# Patient Record
Sex: Female | Born: 1955 | Race: White | Hispanic: No | Marital: Married | Smoking: Never smoker
Health system: Southern US, Community
[De-identification: ages and names within clinical notes are randomized; demographics above are authoritative.]

## PROBLEM LIST (undated history)

## (undated) DIAGNOSIS — I509 Heart failure, unspecified: Secondary | ICD-10-CM

## (undated) DIAGNOSIS — G35 Multiple sclerosis: Secondary | ICD-10-CM

## (undated) DIAGNOSIS — N289 Disorder of kidney and ureter, unspecified: Secondary | ICD-10-CM

## (undated) DIAGNOSIS — C801 Malignant (primary) neoplasm, unspecified: Secondary | ICD-10-CM

## (undated) DIAGNOSIS — I10 Essential (primary) hypertension: Secondary | ICD-10-CM

## (undated) HISTORY — PX: NEPHRECTOMY TRANSPLANTED ORGAN: SUR880

---

## 2019-08-11 ENCOUNTER — Inpatient Hospital Stay (HOSPITAL_COMMUNITY): Payer: BC Managed Care – PPO

## 2019-08-11 ENCOUNTER — Other Ambulatory Visit (HOSPITAL_COMMUNITY): Payer: BC Managed Care – PPO

## 2019-08-11 ENCOUNTER — Inpatient Hospital Stay (HOSPITAL_BASED_OUTPATIENT_CLINIC_OR_DEPARTMENT_OTHER)
Admission: EM | Admit: 2019-08-11 | Discharge: 2019-08-20 | DRG: 871 | Disposition: A | Discharge status: Home / Self Care | Payer: BC Managed Care – PPO | Attending: Family Medicine | Admitting: Family Medicine

## 2019-08-11 ENCOUNTER — Emergency Department (HOSPITAL_BASED_OUTPATIENT_CLINIC_OR_DEPARTMENT_OTHER): Payer: BC Managed Care – PPO

## 2019-08-11 ENCOUNTER — Other Ambulatory Visit: Payer: Self-pay

## 2019-08-11 ENCOUNTER — Encounter (HOSPITAL_BASED_OUTPATIENT_CLINIC_OR_DEPARTMENT_OTHER): Payer: Self-pay

## 2019-08-11 DIAGNOSIS — R402362 Coma scale, best motor response, obeys commands, at arrival to emergency department: Secondary | ICD-10-CM | POA: Diagnosis present

## 2019-08-11 DIAGNOSIS — Z85038 Personal history of other malignant neoplasm of large intestine: Secondary | ICD-10-CM

## 2019-08-11 DIAGNOSIS — D631 Anemia in chronic kidney disease: Secondary | ICD-10-CM | POA: Diagnosis present

## 2019-08-11 DIAGNOSIS — Z79899 Other long term (current) drug therapy: Secondary | ICD-10-CM

## 2019-08-11 DIAGNOSIS — R29810 Facial weakness: Secondary | ICD-10-CM | POA: Diagnosis present

## 2019-08-11 DIAGNOSIS — Z905 Acquired absence of kidney: Secondary | ICD-10-CM

## 2019-08-11 DIAGNOSIS — I5181 Takotsubo syndrome: Secondary | ICD-10-CM | POA: Diagnosis present

## 2019-08-11 DIAGNOSIS — I472 Ventricular tachycardia: Secondary | ICD-10-CM | POA: Insufficient documentation

## 2019-08-11 DIAGNOSIS — I63519 Cerebral infarction due to unspecified occlusion or stenosis of unspecified middle cerebral artery: Secondary | ICD-10-CM | POA: Diagnosis present

## 2019-08-11 DIAGNOSIS — N179 Acute kidney failure, unspecified: Secondary | ICD-10-CM | POA: Diagnosis present

## 2019-08-11 DIAGNOSIS — I447 Left bundle-branch block, unspecified: Secondary | ICD-10-CM | POA: Diagnosis present

## 2019-08-11 DIAGNOSIS — Z87440 Personal history of urinary (tract) infections: Secondary | ICD-10-CM

## 2019-08-11 DIAGNOSIS — N289 Disorder of kidney and ureter, unspecified: Secondary | ICD-10-CM | POA: Diagnosis not present

## 2019-08-11 DIAGNOSIS — R4701 Aphasia: Secondary | ICD-10-CM | POA: Diagnosis present

## 2019-08-11 DIAGNOSIS — I10 Essential (primary) hypertension: Secondary | ICD-10-CM | POA: Diagnosis present

## 2019-08-11 DIAGNOSIS — D649 Anemia, unspecified: Secondary | ICD-10-CM

## 2019-08-11 DIAGNOSIS — R0789 Other chest pain: Secondary | ICD-10-CM | POA: Diagnosis not present

## 2019-08-11 DIAGNOSIS — Z933 Colostomy status: Secondary | ICD-10-CM

## 2019-08-11 DIAGNOSIS — A419 Sepsis, unspecified organism: Secondary | ICD-10-CM | POA: Diagnosis present

## 2019-08-11 DIAGNOSIS — R402222 Coma scale, best verbal response, incomprehensible words, at arrival to emergency department: Secondary | ICD-10-CM | POA: Diagnosis present

## 2019-08-11 DIAGNOSIS — N390 Urinary tract infection, site not specified: Secondary | ICD-10-CM | POA: Diagnosis present

## 2019-08-11 DIAGNOSIS — I462 Cardiac arrest due to underlying cardiac condition: Secondary | ICD-10-CM | POA: Diagnosis present

## 2019-08-11 DIAGNOSIS — Z20828 Contact with and (suspected) exposure to other viral communicable diseases: Secondary | ICD-10-CM | POA: Diagnosis present

## 2019-08-11 DIAGNOSIS — I639 Cerebral infarction, unspecified: Secondary | ICD-10-CM | POA: Diagnosis present

## 2019-08-11 DIAGNOSIS — Z79891 Long term (current) use of opiate analgesic: Secondary | ICD-10-CM

## 2019-08-11 DIAGNOSIS — E876 Hypokalemia: Secondary | ICD-10-CM | POA: Diagnosis not present

## 2019-08-11 DIAGNOSIS — G9340 Encephalopathy, unspecified: Secondary | ICD-10-CM | POA: Diagnosis present

## 2019-08-11 DIAGNOSIS — I4901 Ventricular fibrillation: Secondary | ICD-10-CM

## 2019-08-11 DIAGNOSIS — G9341 Metabolic encephalopathy: Secondary | ICD-10-CM | POA: Diagnosis present

## 2019-08-11 DIAGNOSIS — R29722 NIHSS score 22: Secondary | ICD-10-CM | POA: Diagnosis present

## 2019-08-11 DIAGNOSIS — D75839 Thrombocytosis, unspecified: Secondary | ICD-10-CM

## 2019-08-11 DIAGNOSIS — L89152 Pressure ulcer of sacral region, stage 2: Secondary | ICD-10-CM | POA: Diagnosis present

## 2019-08-11 DIAGNOSIS — G35 Multiple sclerosis: Secondary | ICD-10-CM | POA: Diagnosis present

## 2019-08-11 DIAGNOSIS — I420 Dilated cardiomyopathy: Secondary | ICD-10-CM | POA: Diagnosis present

## 2019-08-11 DIAGNOSIS — D473 Essential (hemorrhagic) thrombocythemia: Secondary | ICD-10-CM

## 2019-08-11 DIAGNOSIS — I6389 Other cerebral infarction: Secondary | ICD-10-CM

## 2019-08-11 DIAGNOSIS — I130 Hypertensive heart and chronic kidney disease with heart failure and stage 1 through stage 4 chronic kidney disease, or unspecified chronic kidney disease: Secondary | ICD-10-CM | POA: Diagnosis present

## 2019-08-11 DIAGNOSIS — I5021 Acute systolic (congestive) heart failure: Secondary | ICD-10-CM | POA: Diagnosis present

## 2019-08-11 DIAGNOSIS — I69351 Hemiplegia and hemiparesis following cerebral infarction affecting right dominant side: Secondary | ICD-10-CM

## 2019-08-11 DIAGNOSIS — F329 Major depressive disorder, single episode, unspecified: Secondary | ICD-10-CM | POA: Diagnosis present

## 2019-08-11 DIAGNOSIS — J9600 Acute respiratory failure, unspecified whether with hypoxia or hypercapnia: Secondary | ICD-10-CM

## 2019-08-11 DIAGNOSIS — R402142 Coma scale, eyes open, spontaneous, at arrival to emergency department: Secondary | ICD-10-CM | POA: Diagnosis present

## 2019-08-11 DIAGNOSIS — N184 Chronic kidney disease, stage 4 (severe): Secondary | ICD-10-CM | POA: Diagnosis present

## 2019-08-11 DIAGNOSIS — L8995 Pressure ulcer of unspecified site, unstageable: Secondary | ICD-10-CM | POA: Diagnosis not present

## 2019-08-11 DIAGNOSIS — Z888 Allergy status to other drugs, medicaments and biological substances status: Secondary | ICD-10-CM

## 2019-08-11 DIAGNOSIS — R778 Other specified abnormalities of plasma proteins: Secondary | ICD-10-CM | POA: Diagnosis not present

## 2019-08-11 DIAGNOSIS — L8990 Pressure ulcer of unspecified site, unspecified stage: Secondary | ICD-10-CM | POA: Insufficient documentation

## 2019-08-11 DIAGNOSIS — I4721 Torsades de pointes: Secondary | ICD-10-CM

## 2019-08-11 DIAGNOSIS — Z880 Allergy status to penicillin: Secondary | ICD-10-CM

## 2019-08-11 DIAGNOSIS — J9601 Acute respiratory failure with hypoxia: Secondary | ICD-10-CM | POA: Diagnosis not present

## 2019-08-11 HISTORY — DX: Multiple sclerosis: G35

## 2019-08-11 HISTORY — DX: Malignant (primary) neoplasm, unspecified: C80.1

## 2019-08-11 HISTORY — DX: Disorder of kidney and ureter, unspecified: N28.9

## 2019-08-11 HISTORY — DX: Essential (primary) hypertension: I10

## 2019-08-11 LAB — BASIC METABOLIC PANEL
Anion gap: 13 (ref 5–15)
BUN: 18 mg/dL (ref 8–23)
CO2: 23 mmol/L (ref 22–32)
Calcium: 8.5 mg/dL — ABNORMAL LOW (ref 8.9–10.3)
Chloride: 102 mmol/L (ref 98–111)
Creatinine, Ser: 1.9 mg/dL — ABNORMAL HIGH (ref 0.44–1.00)
GFR calc Af Amer: 32 mL/min — ABNORMAL LOW (ref >60)
GFR calc non Af Amer: 27 mL/min — ABNORMAL LOW (ref >60)
Glucose, Bld: 122 mg/dL — ABNORMAL HIGH (ref 70–99)
Potassium: 3.9 mmol/L (ref 3.5–5.1)
Sodium: 138 mmol/L (ref 135–145)

## 2019-08-11 LAB — RAPID URINE DRUG SCREEN, HOSP PERFORMED
Amphetamines: NOT DETECTED
Barbiturates: NOT DETECTED
Benzodiazepines: NOT DETECTED
Cocaine: NOT DETECTED
Opiates: NOT DETECTED
Tetrahydrocannabinol: NOT DETECTED

## 2019-08-11 LAB — DIFFERENTIAL
Abs Immature Granulocytes: 0.19 10*3/uL — ABNORMAL HIGH (ref 0.00–0.07)
Basophils Absolute: 0.1 10*3/uL (ref 0.0–0.1)
Basophils Relative: 1 %
Eosinophils Absolute: 0.4 10*3/uL (ref 0.0–0.5)
Eosinophils Relative: 4 %
Immature Granulocytes: 2 %
Lymphocytes Relative: 26 %
Lymphs Abs: 2.8 10*3/uL (ref 0.7–4.0)
Monocytes Absolute: 0.7 10*3/uL (ref 0.1–1.0)
Monocytes Relative: 6 %
Neutro Abs: 6.3 10*3/uL (ref 1.7–7.7)
Neutrophils Relative %: 61 %

## 2019-08-11 LAB — POCT I-STAT 7, (LYTES, BLD GAS, ICA,H+H)
Acid-Base Excess: 5 mmol/L — ABNORMAL HIGH (ref 0.0–2.0)
Acid-Base Excess: 3 mmol/L — ABNORMAL HIGH (ref 0.0–2.0)
Bicarbonate: 26.5 mmol/L (ref 20.0–28.0)
Bicarbonate: 25.7 mmol/L (ref 20.0–28.0)
Calcium, Ion: 1.06 mmol/L — ABNORMAL LOW (ref 1.15–1.40)
Calcium, Ion: 1.15 mmol/L (ref 1.15–1.40)
HCT: 28 % — ABNORMAL LOW (ref 36.0–46.0)
HCT: 29 % — ABNORMAL LOW (ref 36.0–46.0)
Hemoglobin: 9.5 g/dL — ABNORMAL LOW (ref 12.0–15.0)
Hemoglobin: 9.9 g/dL — ABNORMAL LOW (ref 12.0–15.0)
O2 Saturation: 100 %
O2 Saturation: 100 %
Patient temperature: 99.3
Potassium: 3.3 mmol/L — ABNORMAL LOW (ref 3.5–5.1)
Potassium: 3.4 mmol/L — ABNORMAL LOW (ref 3.5–5.1)
Sodium: 135 mmol/L (ref 135–145)
Sodium: 136 mmol/L (ref 135–145)
TCO2: 27 mmol/L (ref 22–32)
TCO2: 27 mmol/L (ref 22–32)
pCO2 arterial: 26 mmHg — ABNORMAL LOW (ref 32.0–48.0)
pCO2 arterial: 34 mmHg (ref 32.0–48.0)
pH, Arterial: 7.616 (ref 7.350–7.450)
pH, Arterial: 7.488 — ABNORMAL HIGH (ref 7.350–7.450)
pO2, Arterial: 190 mmHg — ABNORMAL HIGH (ref 83.0–108.0)
pO2, Arterial: 199 mmHg — ABNORMAL HIGH (ref 83.0–108.0)

## 2019-08-11 LAB — COMPREHENSIVE METABOLIC PANEL
ALT: 57 U/L — ABNORMAL HIGH (ref 0–44)
ALT: 59 U/L — ABNORMAL HIGH (ref 0–44)
AST: 45 U/L — ABNORMAL HIGH (ref 15–41)
AST: 48 U/L — ABNORMAL HIGH (ref 15–41)
Albumin: 3.4 g/dL — ABNORMAL LOW (ref 3.5–5.0)
Albumin: 3 g/dL — ABNORMAL LOW (ref 3.5–5.0)
Alkaline Phosphatase: 101 U/L (ref 38–126)
Alkaline Phosphatase: 93 U/L (ref 38–126)
Anion gap: 15 (ref 5–15)
Anion gap: 12 (ref 5–15)
BUN: 17 mg/dL (ref 8–23)
BUN: 15 mg/dL (ref 8–23)
CO2: 22 mmol/L (ref 22–32)
CO2: 22 mmol/L (ref 22–32)
Calcium: 9.1 mg/dL (ref 8.9–10.3)
Calcium: 8.7 mg/dL — ABNORMAL LOW (ref 8.9–10.3)
Chloride: 100 mmol/L (ref 98–111)
Chloride: 102 mmol/L (ref 98–111)
Creatinine, Ser: 1.49 mg/dL — ABNORMAL HIGH (ref 0.44–1.00)
Creatinine, Ser: 1.69 mg/dL — ABNORMAL HIGH (ref 0.44–1.00)
GFR calc Af Amer: 42 mL/min — ABNORMAL LOW (ref >60)
GFR calc Af Amer: 36 mL/min — ABNORMAL LOW (ref >60)
GFR calc non Af Amer: 36 mL/min — ABNORMAL LOW (ref >60)
GFR calc non Af Amer: 31 mL/min — ABNORMAL LOW (ref >60)
Glucose, Bld: 146 mg/dL — ABNORMAL HIGH (ref 70–99)
Glucose, Bld: 209 mg/dL — ABNORMAL HIGH (ref 70–99)
Potassium: 3.5 mmol/L (ref 3.5–5.1)
Potassium: 3.4 mmol/L — ABNORMAL LOW (ref 3.5–5.1)
Sodium: 137 mmol/L (ref 135–145)
Sodium: 136 mmol/L (ref 135–145)
Total Bilirubin: 0.8 mg/dL (ref 0.3–1.2)
Total Bilirubin: 0.4 mg/dL (ref 0.3–1.2)
Total Protein: 6.6 g/dL (ref 6.5–8.1)
Total Protein: 6 g/dL — ABNORMAL LOW (ref 6.5–8.1)

## 2019-08-11 LAB — URINALYSIS, MICROSCOPIC (REFLEX): Squamous Epithelial / HPF: NONE SEEN (ref 0–5)

## 2019-08-11 LAB — LIPID PANEL
Cholesterol: 160 mg/dL (ref 0–200)
HDL: 35 mg/dL — ABNORMAL LOW (ref >40)
LDL Cholesterol: 92 mg/dL (ref 0–99)
Total CHOL/HDL Ratio: 4.6 RATIO
Triglycerides: 165 mg/dL — ABNORMAL HIGH (ref <150)
VLDL: 33 mg/dL (ref 0–40)

## 2019-08-11 LAB — URINALYSIS, ROUTINE W REFLEX MICROSCOPIC
Bilirubin Urine: NEGATIVE
Glucose, UA: NEGATIVE mg/dL
Ketones, ur: 15 mg/dL — AB
Nitrite: NEGATIVE
Protein, ur: 100 mg/dL — AB
Specific Gravity, Urine: 1.015 (ref 1.005–1.030)
pH: 8 (ref 5.0–8.0)

## 2019-08-11 LAB — CBC
HCT: 30.6 % — ABNORMAL LOW (ref 36.0–46.0)
HCT: 32 % — ABNORMAL LOW (ref 36.0–46.0)
Hemoglobin: 9.6 g/dL — ABNORMAL LOW (ref 12.0–15.0)
Hemoglobin: 9.9 g/dL — ABNORMAL LOW (ref 12.0–15.0)
MCH: 29.6 pg (ref 26.0–34.0)
MCH: 29.9 pg (ref 26.0–34.0)
MCHC: 30.9 g/dL (ref 30.0–36.0)
MCHC: 31.4 g/dL (ref 30.0–36.0)
MCV: 95.3 fL (ref 80.0–100.0)
MCV: 95.5 fL (ref 80.0–100.0)
Platelets: 411 10*3/uL — ABNORMAL HIGH (ref 150–400)
Platelets: 458 10*3/uL — ABNORMAL HIGH (ref 150–400)
RBC: 3.21 MIL/uL — ABNORMAL LOW (ref 3.87–5.11)
RBC: 3.35 MIL/uL — ABNORMAL LOW (ref 3.87–5.11)
RDW: 16.5 % — ABNORMAL HIGH (ref 11.5–15.5)
RDW: 16.7 % — ABNORMAL HIGH (ref 11.5–15.5)
WBC: 10.5 10*3/uL (ref 4.0–10.5)
WBC: 27.3 10*3/uL — ABNORMAL HIGH (ref 4.0–10.5)
nRBC: 0.5 % — ABNORMAL HIGH (ref 0.0–0.2)
nRBC: 0.7 % — ABNORMAL HIGH (ref 0.0–0.2)

## 2019-08-11 LAB — ETHANOL: Alcohol, Ethyl (B): <10 mg/dL (ref <10)

## 2019-08-11 LAB — APTT: aPTT: 29 seconds (ref 24–36)

## 2019-08-11 LAB — SARS CORONAVIRUS 2 (TAT 6-24 HRS): SARS Coronavirus 2: NEGATIVE

## 2019-08-11 LAB — ECHOCARDIOGRAM COMPLETE
Height: 62 in
Weight: 2130.53 oz

## 2019-08-11 LAB — PROTIME-INR
INR: 1.1 (ref 0.8–1.2)
Prothrombin Time: 13.6 seconds (ref 11.4–15.2)

## 2019-08-11 LAB — CBG MONITORING, ED
Glucose-Capillary: 124 mg/dL — ABNORMAL HIGH (ref 70–99)
Glucose-Capillary: 124 mg/dL — ABNORMAL HIGH (ref 70–99)

## 2019-08-11 LAB — HEMOGLOBIN A1C
Hgb A1c MFr Bld: 4.5 % — ABNORMAL LOW (ref 4.8–5.6)
Hgb A1c MFr Bld: 4.6 % — ABNORMAL LOW (ref 4.8–5.6)
Mean Plasma Glucose: 82.45 mg/dL
Mean Plasma Glucose: 85.32 mg/dL

## 2019-08-11 LAB — GLUCOSE, CAPILLARY
Glucose-Capillary: 119 mg/dL — ABNORMAL HIGH (ref 70–99)
Glucose-Capillary: 103 mg/dL — ABNORMAL HIGH (ref 70–99)

## 2019-08-11 LAB — TROPONIN I (HIGH SENSITIVITY)
Troponin I (High Sensitivity): 38 ng/L — ABNORMAL HIGH (ref <18)
Troponin I (High Sensitivity): 344 ng/L (ref <18)
Troponin I (High Sensitivity): 229 ng/L (ref <18)

## 2019-08-11 LAB — MAGNESIUM
Magnesium: 2.6 mg/dL — ABNORMAL HIGH (ref 1.7–2.4)
Magnesium: 2.4 mg/dL (ref 1.7–2.4)

## 2019-08-11 LAB — SARS CORONAVIRUS 2 BY RT PCR (HOSPITAL ORDER, PERFORMED IN ~~LOC~~ HOSPITAL LAB): SARS Coronavirus 2: NEGATIVE

## 2019-08-11 LAB — MRSA PCR SCREENING: MRSA by PCR: NEGATIVE

## 2019-08-11 MED ORDER — ASPIRIN 300 MG RE SUPP
300.0000 mg | Freq: Every day | RECTAL | Status: DC
  Administered 2019-08-12: 10:00:00 300 mg via RECTAL
  Filled 2019-08-11 (×2): qty 1

## 2019-08-11 MED ORDER — PERFLUTREN LIPID MICROSPHERE
1.0000 mL | INTRAVENOUS | Status: AC | PRN
  Administered 2019-08-11: 15:00:00 2 mL via INTRAVENOUS
  Filled 2019-08-11: qty 10

## 2019-08-11 MED ORDER — INSULIN ASPART 100 UNIT/ML ~~LOC~~ SOLN: 0.0000 [IU] | SUBCUTANEOUS | Status: DC

## 2019-08-11 MED ORDER — ACETAMINOPHEN 160 MG/5ML PO SOLN: 650.0000 mg | ORAL | Status: DC | PRN

## 2019-08-11 MED ORDER — AMIODARONE HCL 150 MG/3ML IV SOLN
INTRAVENOUS | Status: AC | PRN
  Administered 2019-08-11: 150 mg via INTRAVENOUS

## 2019-08-11 MED ORDER — DIPHENHYDRAMINE HCL 50 MG/ML IJ SOLN
12.5000 mg | Freq: Once | INTRAMUSCULAR | Status: AC
  Administered 2019-08-11: 02:00:00 12.5 mg via INTRAVENOUS
  Filled 2019-08-11: qty 1

## 2019-08-11 MED ORDER — ORAL CARE MOUTH RINSE
15.0000 mL | OROMUCOSAL | Status: DC
  Administered 2019-08-11 – 2019-08-13 (×11): 15 mL via OROMUCOSAL

## 2019-08-11 MED ORDER — MAGNESIUM SULFATE 50 % IJ SOLN
INTRAMUSCULAR | Status: AC | PRN
  Administered 2019-08-11: 2 g via INTRAVENOUS

## 2019-08-11 MED ORDER — CHLORHEXIDINE GLUCONATE 0.12% ORAL RINSE (MEDLINE KIT)
15.0000 mL | Freq: Two times a day (BID) | OROMUCOSAL | Status: DC
  Administered 2019-08-11 – 2019-08-12 (×2): 15 mL via OROMUCOSAL

## 2019-08-11 MED ORDER — PANTOPRAZOLE SODIUM 40 MG IV SOLR
40.0000 mg | Freq: Every day | INTRAVENOUS | Status: DC
  Administered 2019-08-11: 10:00:00 40 mg via INTRAVENOUS
  Filled 2019-08-11 (×2): qty 40

## 2019-08-11 MED ORDER — SODIUM CHLORIDE 0.9 % IV SOLN
INTRAVENOUS | Status: AC
  Administered 2019-08-11 (×2): via INTRAVENOUS

## 2019-08-11 MED ORDER — CHLORHEXIDINE GLUCONATE CLOTH 2 % EX PADS
6.0000 | MEDICATED_PAD | Freq: Every day | CUTANEOUS | Status: DC
  Administered 2019-08-11 – 2019-08-14 (×2): 6 via TOPICAL

## 2019-08-11 MED ORDER — ENOXAPARIN SODIUM 30 MG/0.3ML ~~LOC~~ SOLN
30.0000 mg | Freq: Every day | SUBCUTANEOUS | Status: DC
  Administered 2019-08-11 – 2019-08-13 (×3): 30 mg via SUBCUTANEOUS
  Filled 2019-08-11 (×4): qty 0.3

## 2019-08-11 MED ORDER — ACETAMINOPHEN 325 MG PO TABS: 650.0000 mg | ORAL_TABLET | ORAL | Status: DC | PRN

## 2019-08-11 MED ORDER — FENTANYL CITRATE (PF) 100 MCG/2ML IJ SOLN: 25.0000 ug | INTRAMUSCULAR | Status: DC | PRN

## 2019-08-11 MED ORDER — PROCHLORPERAZINE EDISYLATE 10 MG/2ML IJ SOLN
5.0000 mg | Freq: Once | INTRAMUSCULAR | Status: AC
  Administered 2019-08-11: 5 mg via INTRAVENOUS
  Filled 2019-08-11: qty 2

## 2019-08-11 MED ORDER — IOHEXOL 350 MG/ML SOLN
100.0000 mL | Freq: Once | INTRAVENOUS | Status: AC | PRN
  Administered 2019-08-11: 100 mL via INTRAVENOUS

## 2019-08-11 MED ORDER — ACETAMINOPHEN 650 MG RE SUPP
650.0000 mg | RECTAL | Status: DC | PRN
  Filled 2019-08-11: qty 1

## 2019-08-11 MED ORDER — METRONIDAZOLE IN NACL 5-0.79 MG/ML-% IV SOLN
500.0000 mg | Freq: Four times a day (QID) | INTRAVENOUS | Status: DC
  Administered 2019-08-11 – 2019-08-13 (×7): 500 mg via INTRAVENOUS
  Filled 2019-08-11 (×7): qty 100

## 2019-08-11 MED ORDER — HYDRALAZINE HCL 20 MG/ML IJ SOLN: 10.0000 mg | INTRAMUSCULAR | Status: DC | PRN

## 2019-08-11 MED ORDER — SODIUM CHLORIDE 0.9 % IV SOLN
1.0000 g | Freq: Every day | INTRAVENOUS | Status: DC
  Administered 2019-08-11 – 2019-08-12 (×2): 1 g via INTRAVENOUS
  Filled 2019-08-11: qty 1
  Filled 2019-08-11 (×2): qty 10

## 2019-08-11 MED ORDER — ASPIRIN 325 MG PO TABS
325.0000 mg | ORAL_TABLET | Freq: Every day | ORAL | Status: DC
  Administered 2019-08-11 – 2019-08-16 (×5): 325 mg via ORAL
  Filled 2019-08-11 (×6): qty 1

## 2019-08-11 MED ORDER — LORAZEPAM 2 MG/ML IJ SOLN
0.2500 mg | Freq: Once | INTRAMUSCULAR | Status: AC
  Administered 2019-08-11: 0.25 mg via INTRAVENOUS
  Filled 2019-08-11: qty 1

## 2019-08-11 MED ORDER — FLUMAZENIL 0.5 MG/5ML IV SOLN
0.2000 mg | Freq: Once | INTRAVENOUS | Status: AC
  Administered 2019-08-11: 0.2 mg via INTRAVENOUS

## 2019-08-11 MED ORDER — POTASSIUM CHLORIDE 20 MEQ/15ML (10%) PO SOLN
20.0000 meq | Freq: Once | ORAL | Status: AC
  Administered 2019-08-12: 01:00:00 20 meq via ORAL
  Filled 2019-08-11: qty 15

## 2019-08-11 MED ORDER — ACETAMINOPHEN 160 MG/5ML PO SOLN
650.0000 mg | Freq: Once | ORAL | Status: AC
  Administered 2019-08-11: 10:00:00 650 mg
  Filled 2019-08-11: qty 20.3

## 2019-08-11 MED ORDER — PROPOFOL 1000 MG/100ML IV EMUL
INTRAVENOUS | Status: AC
  Filled 2019-08-11: qty 100

## 2019-08-11 MED ORDER — FENTANYL CITRATE (PF) 100 MCG/2ML IJ SOLN
25.0000 ug | INTRAMUSCULAR | Status: DC | PRN
  Administered 2019-08-12: 100 ug via INTRAVENOUS
  Filled 2019-08-11: qty 2

## 2019-08-11 MED ORDER — SUCCINYLCHOLINE CHLORIDE 20 MG/ML IJ SOLN
INTRAMUSCULAR | Status: AC | PRN
  Administered 2019-08-11: 100 mg via INTRAVENOUS

## 2019-08-11 MED ORDER — STROKE: EARLY STAGES OF RECOVERY BOOK
Freq: Once | Status: DC
  Filled 2019-08-11: qty 1

## 2019-08-11 MED ORDER — PROPOFOL 1000 MG/100ML IV EMUL
5.0000 ug/kg/min | INTRAVENOUS | Status: DC
  Administered 2019-08-11: 20:00:00 40 ug/kg/min via INTRAVENOUS
  Administered 2019-08-11: 07:00:00 5 ug/kg/min via INTRAVENOUS
  Filled 2019-08-11 (×3): qty 100

## 2019-08-11 MED FILL — Medication: Qty: 1 | Status: AC

## 2019-08-11 NOTE — Progress Notes (Signed)
Received pt in MRI on vent from ED RT.   S/p MRI, pt transported back to ED w/ no apparent complications noted.  ED RT aware.

## 2019-08-11 NOTE — ED Notes (Addendum)
Late entry- Pt was moved from hallway into treatment room due to incontinence.  Pt cleaned and linens changed.  Ativan 0.25mg  given at 0620 for MRI.  Within 1 min of administration pt had respiratory depression with snoring respirations.  Placed on NRB at 15 liters O2.  Placed back on cardiac monitor.  Breathing became agonal.  Dr. Roxanne Mins immediately called to bedside.  Pt being bagged with BVM. Pt in Vfib, no pulse- CPR initiated by Dr. Roxanne Mins.  Defib x 1 @ 120 J at [phone].  SR with strong pulses present.  Romazicon administered.  Pt placed back on NRB and having spontaneous respirations.  No gag reflex so pt intubated by Dr. Roxanne Mins.  Succs 100mg  and Propofol initiated post intubated due to pt biting on ET tube.  Pt started having runs of V tach.  Dr. Roxanne Mins called back to bedside.  Pt then in torsades.  Defib x 1 @ 150J.  SR with strong pulses.  Amiodarone 150mg  IV administered and Mag 2mg  IV initiated.    Pt's daughter remained at bedside during event.  She is a Therapist, sports and requested to stay.Support given to her and Chaplain to bedside for family support.  Pt's husband called and now at bedside.

## 2019-08-11 NOTE — ED Triage Notes (Signed)
Pt is a code stroke transfer form [facility] to obtain MRi from our facility. LKN 2230 last evening. Per EMS pt was not a TPA candidate because of a recent kidney surgery. Pt had R sided deficits, HA, and appeared altered initially. Upon notes head CTA appeared negative from [facility]. BP 138/64, HR 70, 100% RA o2. Gave 5mg  Compazine and 12.5mg  Benadryl @ [facility]

## 2019-08-11 NOTE — Progress Notes (Signed)
ABG results as follows:  Ph 7.62, PCO2  26 , PO2 190, HCO3-  27.  Results hand-delivered to Dr. Lamonte Sakai at bedside.  PRVC rate decreased to 12, fio2 decreased to .60.  Will continue to monitor.

## 2019-08-11 NOTE — ED Notes (Signed)
Dr. Hal Hope at bedside.  IV team remains at bedside.

## 2019-08-11 NOTE — ED Provider Notes (Addendum)
Called to see the patient emergently because of marked respiratory depression following a dose of intravenous lorazepam for MRI scan.  On arrival, patient with agonal respirations but maintaining adequate oxygen saturation while on nonrebreather mask.  However, she was found to be pulseless and monitor showed ventricular fibrillation.  CPR was initiated and patient was defibrillated once with successful conversion to sinus rhythm with a strong pulse.  She was given a dose of Romazicon, and seemed to be maintaining adequate respirations, but was noted to have absent gag reflex.  Therefore, the decision was made to intubate the patient to protect her airway.  Patient was intubated without sedation.  Following intubation, she was biting down on the endotracheal tube, so she is being sedated with diprovan.  Post intubation x-ray is pending.  Admission will be changed to critical care, who have been paged.  Post conversion ECG showed new right bundle branch block and new fairly deep T wave inversions in the anteroseptal leads.  It is not clear if these are related to her stroke or cardiac origin.  Patient started having runs of ventricular tachycardia and then went into ventricular fibrillation in a pattern that appeared to be torsade de pointes.  She was defibrillated x1 with return to sinus rhythm.  She is being given amiodarone bolus and drip as well as intravenous magnesium.  Case is discussed with Dr. Lamonte Sakai of critical care service who agrees to admit the patient.   EKG Interpretation  Date/Time:  Monday August 11 2019 06:42:02 EDT Ventricular Rate:  62 PR Interval:    QRS Duration: 147 QT Interval:  501 QTC Calculation: 509 R Axis:   -81 Text Interpretation:  Complete AV block with wide QRS complex RBBB and LAFB T wave inversion Anteroseptal leads Prolonged QT When compared with ECG of EARLIER SAME DATE Right bundle branch block is now present T wave inversion is now present Confirmed by Delora Fuel  ([id number]) on 08/11/2019 6:59:30 AM      Cardiopulmonary Resuscitation (CPR) Procedure Note Directed/Performed by: Delora Fuel I personally directed ancillary staff and/or performed CPR in an effort to regain return of spontaneous circulation and to maintain cardiac, neuro and systemic perfusion.   CRITICAL CARE Performed by: Delora Fuel Total critical care time: 35 minutes Critical care time was exclusive of separately billable procedures and treating other patients. Critical care was necessary to treat or prevent imminent or life-threatening deterioration. Critical care was time spent personally by me on the following activities: development of treatment plan with patient and/or surrogate as well as nursing, discussions with consultants, evaluation of patient's response to treatment, examination of patient, obtaining history from patient or surrogate, ordering and performing treatments and interventions, ordering and review of laboratory studies, ordering and review of radiographic studies, pulse oximetry and re-evaluation of patient's condition.  Procedure Name: Intubation Date/Time: 08/11/2019 7:06 AM Performed by: Delora Fuel, MD Oxygen Delivery Method: Non-rebreather mask Preoxygenation: Pre-oxygenation with 100% oxygen Laryngoscope Size: Glidescope and 3 Grade View: Grade I Tube size: 7.5 mm Number of attempts: 1 Airway Equipment and Method: Video-laryngoscopy and Rigid stylet Placement Confirmation: ETT inserted through vocal cords under direct vision,  Positive ETCO2 and Breath sounds checked- equal and bilateral Secured at: 25 cm Tube secured with: ETT holder Dental Injury: Teeth and Oropharynx as per pre-operative assessment          Delora Fuel, MD 17/00/17 4944    Delora Fuel, MD 96/75/91 0740

## 2019-08-11 NOTE — H&P (Signed)
History and Physical    Jeanne Bruce BSJ:[id number] DOB: 05-Jun-1956 DOA: 08/11/2019  PCP: Derrill Center., MD  Patient coming from: Home.  History obtained from patient's daughter.  Patient is confused.  Chief Complaint: Right facial droop slurred speech.  HPI: Jeanne Bruce is a 63 y.o. 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  Patient's daughter went to check on her and found that she had a right facial droop and slurred speech and she had profound weakness of the right upper and lower extremity more than usual when patient was not able lifted up.  Patient was brought into the ER at [facility] as a code stroke.  Per patient's daughter patient had some nausea vomiting.  After the initial complaint patient became more confused.  Did not have any chest pain or shortness of breath fever or chills.  Has been on multiple antibiotic recently for UTI.  ED Course: In the ER admits to the [facility] patient had CT head followed by CT angiogram of the head and neck which did not show any large vessel obstruction.  Patient's weakness of the right upper and lower extremity gradually improved but still about 4 x 5 strength in the right upper extremity and 3 x 5 in the right lower extremity.  Right facial droop has improved.  Patient is confused and does not follow commands.  Pupils equal reacting to light.  Tele neurology was consulted and family has declined TPA.  Patient admitted for further management of possible CVA.  On my exam patient's exam was as explained above.  UA showing possibility of UTI.  Patient does have a colostomy from previous surgery for colon cancer done in the 90s.  EKG shows sinus rhythm with atypical LBBB.  Patient is afebrile COVID-19 test is pending.  Rest of the  labs show creatinine of 1.4 which is around the same when compared with in care everywhere.  AST 45 ALT 57 platelets 411.  INR 1.1 patient was given Compazine and Benadryl for headache.  Review of Systems: As per HPI, rest all negative.   Past Medical History:  Diagnosis Date   Cancer ([place])    colon (resolved)   Hypertension    Multiple sclerosis ([place])    Renal disorder     Past Surgical History:  Procedure Laterality Date   NEPHRECTOMY TRANSPLANTED ORGAN       reports that she has never smoked. She has never used smokeless tobacco. She reports previous alcohol use. She reports that she does not use drugs.  Allergies  Allergen Reactions   Invanz [Ertapenem] Anaphylaxis   Mirabegron Other (See Comments)    Caused Hypertension   Claritin-D 12 Hour [Loratadine-Pseudoephedrine Er] Rash   Penicillins Rash    Pt tolerates cephalosporins  Did it involve swelling of the face/tongue/throat, SOB, or low BP? No Did it involve sudden or severe rash/hives, skin peeling, or any reaction on the inside of your mouth or nose? No Did you need to seek medical attention at a hospital or doctor's office? No When did it last happen?A long time ago per daughter  If all above answers are NO, may proceed with cephalosporin use.     Family History  Problem Relation Age of Onset   Diabetes Mellitus II Neg Hx  Prior to Admission medications   Medication Sig Start Date End Date Taking? Authorizing Provider  calcium-vitamin D (OSCAL WITH D) 500-200 MG-UNIT tablet Take 1 tablet by mouth.   Yes [provider]  conjugated estrogens (PREMARIN) vaginal cream Place 1 Applicatorful vaginally at bedtime.   Yes [provider]  escitalopram (LEXAPRO) 10 MG tablet Take 10 mg by mouth daily.   Yes [provider]  magnesium oxide (MAG-OX) 400 MG tablet Take 400 mg by mouth 2 (two) times daily.   Yes [provider]  natalizumab (TYSABRI) 300 MG/15ML  injection Inject into the vein every 6 (six) weeks.   Yes [provider]  omeprazole (PRILOSEC) 20 MG capsule Take 20 mg by mouth 2 (two) times daily before a meal.   Yes [provider]  ondansetron (ZOFRAN) 4 MG tablet Take 4 mg by mouth daily as needed for nausea/vomiting. 06/02/19  Yes [provider]  oxyCODONE (OXY IR/ROXICODONE) 5 MG immediate release tablet Take 5 mg by mouth every 4 (four) hours as needed for pain. 06/04/19  Yes [provider]  potassium chloride SA (KLOR-CON) 20 MEQ tablet Take 20 mEq by mouth 2 (two) times daily.   Yes [provider]  Probiotic Product (PROBIOTIC DAILY PO) Take 1 capsule by mouth daily.   Yes [provider]    Physical Exam: Constitutional: Moderately built and nourished. Vitals:   08/11/19 0215 08/11/19 0230 08/11/19 0245 08/11/19 0338  BP: 126/77 (!) 167/77 (!) 165/72 (!) 161/74  Pulse:   68 62  Resp: (!) 22 15 18 16   Temp:    98.5 F (36.9 C)  TempSrc:    Oral  SpO2: 99%  100% 100%  Weight:      Height:       Eyes: Anicteric no pallor. ENMT: No discharge from the ears eyes nose or mouth. Neck: No mass or.  No neck rigidity. Respiratory: No rhonchi or crepitations. Cardiovascular: S1-S2 heard. Abdomen: Colostomy bag seen and also recent surgical wound seen with nonhealing wound on the right lower quadrant. Musculoskeletal: No edema. Skin: Skin normal abdomen. Neurologic: Alert awake but not oriented and does not follow commands.  Right upper extremity is full raphae and right lower extremity 3 x 5.  Left upper extremity is 5 x 5 and left lower extremities 5 x 5.  No facial asymmetry tongue is midline pupils are equal and reactive to light. Psychiatric: Appears confused.   Labs on Admission: I have personally reviewed following labs and imaging studies  CBC: Recent Labs  Lab 08/11/19 0045  WBC 10.5  NEUTROABS 6.3  HGB 9.9*  HCT 32.0*  MCV 95.5  PLT 411*   Basic  Metabolic Panel: Recent Labs  Lab 08/11/19 0045  NA 137  K 3.5  CL 100  CO2 22  GLUCOSE 146*  BUN 17  CREATININE 1.49*  CALCIUM 9.1   GFR: Estimated Creatinine Clearance: 29.8 mL/min (A) (by C-G formula based on SCr of 1.49 mg/dL (H)). Liver Function Tests: Recent Labs  Lab 08/11/19 0045  AST 45*  ALT 57*  ALKPHOS 101  BILITOT 0.8  PROT 6.6  ALBUMIN 3.4*   No results for input(s): LIPASE, AMYLASE in the last 168 hours. No results for input(s): AMMONIA in the last 168 hours. Coagulation Profile: Recent Labs  Lab 08/11/19 0045  INR 1.1   Cardiac Enzymes: No results for input(s): CKTOTAL, CKMB, CKMBINDEX, TROPONINI in the last 168 hours. BNP (last 3 results) No results for input(s):  PROBNP in the last 8760 hours. HbA1C: No results for input(s): HGBA1C in the last 72 hours. CBG: Recent Labs  Lab 08/11/19 0028  GLUCAP 124*   Lipid Profile: No results for input(s): CHOL, HDL, LDLCALC, TRIG, CHOLHDL, LDLDIRECT in the last 72 hours. Thyroid Function Tests: No results for input(s): TSH, T4TOTAL, FREET4, T3FREE, THYROIDAB in the last 72 hours. Anemia Panel: No results for input(s): VITAMINB12, FOLATE, FERRITIN, TIBC, IRON, RETICCTPCT in the last 72 hours. Urine analysis:    Component Value Date/Time   COLORURINE STRAW (A) 08/11/2019 0113   APPEARANCEUR HAZY (A) 08/11/2019 0113   LABSPEC 1.015 08/11/2019 0113   PHURINE 8.0 08/11/2019 0113   GLUCOSEU NEGATIVE 08/11/2019 0113   HGBUR TRACE (A) 08/11/2019 0113   BILIRUBINUR NEGATIVE 08/11/2019 0113   KETONESUR 15 (A) 08/11/2019 0113   PROTEINUR 100 (A) 08/11/2019 0113   NITRITE NEGATIVE 08/11/2019 0113   LEUKOCYTESUR SMALL (A) 08/11/2019 0113   Sepsis Labs: @LABRCNTIP (procalcitonin:4,lacticidven:4) )No results found for this or any previous visit (from the past 240 hour(s)).   Radiological Exams on Admission: Ct Code Stroke Cta Head W/wo Contrast  Result Date: 08/11/2019 CLINICAL DATA:  Sudden onset  headache with unilateral weakness. EXAM: CT ANGIOGRAPHY HEAD AND NECK TECHNIQUE: Multidetector CT imaging of the head and neck was performed using the standard protocol during bolus administration of intravenous contrast. Multiplanar CT image reconstructions and MIPs were obtained to evaluate the vascular anatomy. Carotid stenosis measurements (when applicable) are obtained utilizing NASCET criteria, using the distal internal carotid diameter as the denominator. CONTRAST:  162mL OMNIPAQUE IOHEXOL 350 MG/ML SOLN COMPARISON:  Head CT 08/10/2019 FINDINGS: CT HEAD FINDINGS Brain: There is no mass, hemorrhage or extra-axial collection. The size and configuration of the ventricles and extra-axial CSF spaces are normal. There is no acute or chronic infarction. There is hypoattenuation of the periventricular white matter, most commonly indicating chronic ischemic microangiopathy. Skull: The visualized skull base, calvarium and extracranial soft tissues are normal. Sinuses/Orbits: No fluid levels or advanced mucosal thickening of the visualized paranasal sinuses. No mastoid or middle ear effusion. The orbits are normal. CTA NECK FINDINGS SKELETON: There is no bony spinal canal stenosis. No lytic or blastic lesion. OTHER NECK: Normal pharynx, larynx and major salivary glands. No cervical lymphadenopathy. Unremarkable thyroid gland. UPPER CHEST: No pneumothorax or pleural effusion. No nodules or masses. AORTIC ARCH: There is no calcific atherosclerosis of the aortic arch. There is no aneurysm, dissection or hemodynamically significant stenosis of the visualized portion of the aorta. Conventional 3 vessel aortic branching pattern. The visualized proximal subclavian arteries are widely patent. RIGHT CAROTID SYSTEM: Normal without aneurysm, dissection or stenosis. LEFT CAROTID SYSTEM: Normal without aneurysm, dissection or stenosis. VERTEBRAL ARTERIES: Codominant configuration. Both origins are clearly patent. There is no  dissection, occlusion or flow-limiting stenosis to the skull base (V1-V3 segments). CTA HEAD FINDINGS POSTERIOR CIRCULATION: --Vertebral arteries: Normal V4 segments. --Posterior inferior cerebellar arteries (PICA): Patent origins from the vertebral arteries. --Anterior inferior cerebellar arteries (AICA): Patent origins from the basilar artery. --Basilar artery: Normal. --Superior cerebellar arteries: Normal. --Posterior cerebral arteries: Normal. Both originate from the basilar artery. Posterior communicating arteries (p-comm) are diminutive or absent. ANTERIOR CIRCULATION: --Intracranial internal carotid arteries: Normal. --Anterior cerebral arteries (ACA): Normal. Both A1 segments are present. Patent anterior communicating artery (a-comm). --Middle cerebral arteries (MCA): Normal. VENOUS SINUSES: As permitted by contrast timing, patent. ANATOMIC VARIANTS: None Review of the MIP images confirms the above findings. IMPRESSION: 1. No intracranial arterial occlusion or high-grade stenosis. 2. No  dissection, aneurysm or hemodynamically significant stenosis of the major cervical or intracranial arteries. Electronically Signed   By: Ulyses Jarred M.D.   On: 08/11/2019 01:39   Ct Code Stroke Cta Neck W/wo Contrast  Result Date: 08/11/2019 CLINICAL DATA:  Sudden onset headache with unilateral weakness. EXAM: CT ANGIOGRAPHY HEAD AND NECK TECHNIQUE: Multidetector CT imaging of the head and neck was performed using the standard protocol during bolus administration of intravenous contrast. Multiplanar CT image reconstructions and MIPs were obtained to evaluate the vascular anatomy. Carotid stenosis measurements (when applicable) are obtained utilizing NASCET criteria, using the distal internal carotid diameter as the denominator. CONTRAST:  174mL OMNIPAQUE IOHEXOL 350 MG/ML SOLN COMPARISON:  Head CT 08/10/2019 FINDINGS: CT HEAD FINDINGS Brain: There is no mass, hemorrhage or extra-axial collection. The size and  configuration of the ventricles and extra-axial CSF spaces are normal. There is no acute or chronic infarction. There is hypoattenuation of the periventricular white matter, most commonly indicating chronic ischemic microangiopathy. Skull: The visualized skull base, calvarium and extracranial soft tissues are normal. Sinuses/Orbits: No fluid levels or advanced mucosal thickening of the visualized paranasal sinuses. No mastoid or middle ear effusion. The orbits are normal. CTA NECK FINDINGS SKELETON: There is no bony spinal canal stenosis. No lytic or blastic lesion. OTHER NECK: Normal pharynx, larynx and major salivary glands. No cervical lymphadenopathy. Unremarkable thyroid gland. UPPER CHEST: No pneumothorax or pleural effusion. No nodules or masses. AORTIC ARCH: There is no calcific atherosclerosis of the aortic arch. There is no aneurysm, dissection or hemodynamically significant stenosis of the visualized portion of the aorta. Conventional 3 vessel aortic branching pattern. The visualized proximal subclavian arteries are widely patent. RIGHT CAROTID SYSTEM: Normal without aneurysm, dissection or stenosis. LEFT CAROTID SYSTEM: Normal without aneurysm, dissection or stenosis. VERTEBRAL ARTERIES: Codominant configuration. Both origins are clearly patent. There is no dissection, occlusion or flow-limiting stenosis to the skull base (V1-V3 segments). CTA HEAD FINDINGS POSTERIOR CIRCULATION: --Vertebral arteries: Normal V4 segments. --Posterior inferior cerebellar arteries (PICA): Patent origins from the vertebral arteries. --Anterior inferior cerebellar arteries (AICA): Patent origins from the basilar artery. --Basilar artery: Normal. --Superior cerebellar arteries: Normal. --Posterior cerebral arteries: Normal. Both originate from the basilar artery. Posterior communicating arteries (p-comm) are diminutive or absent. ANTERIOR CIRCULATION: --Intracranial internal carotid arteries: Normal. --Anterior cerebral  arteries (ACA): Normal. Both A1 segments are present. Patent anterior communicating artery (a-comm). --Middle cerebral arteries (MCA): Normal. VENOUS SINUSES: As permitted by contrast timing, patent. ANATOMIC VARIANTS: None Review of the MIP images confirms the above findings. IMPRESSION: 1. No intracranial arterial occlusion or high-grade stenosis. 2. No dissection, aneurysm or hemodynamically significant stenosis of the major cervical or intracranial arteries. Electronically Signed   By: Ulyses Jarred M.D.   On: 08/11/2019 01:39   Ct Head Code Stroke Wo Contrast  Result Date: 08/11/2019 CLINICAL DATA:  Code stroke.  Headache with unilateral weakness. EXAM: CT HEAD WITHOUT CONTRAST TECHNIQUE: Contiguous axial images were obtained from the base of the skull through the vertex without intravenous contrast. COMPARISON:  None. FINDINGS: Brain: There is no mass, hemorrhage or extra-axial collection. The size and configuration of the ventricles and extra-axial CSF spaces are normal. There is hypoattenuation of the periventricular white matter, most commonly indicating chronic ischemic microangiopathy. Vascular: No abnormal hyperdensity of the major intracranial arteries or dural venous sinuses. No intracranial atherosclerosis. Skull: The visualized skull base, calvarium and extracranial soft tissues are normal. Sinuses/Orbits: No fluid levels or advanced mucosal thickening of the visualized paranasal sinuses. No mastoid or  middle ear effusion. The orbits are normal. ASPECTS [facility] Stroke Program Early CT Score) - Ganglionic level infarction (caudate, lentiform nuclei, internal capsule, insula, M1-M3 cortex): 7 - Supraganglionic infarction (M4-M6 cortex): 3 Total score (0-10 with 10 being normal): 10 IMPRESSION: 1. Chronic small vessel disease.  No intracranial hemorrhage. 2. ASPECTS is 10. 3. These results were called by telephone at the time of interpretation on 08/11/2019 at 12:45 am to Dr. Thayer Jew , who  verbally acknowledged these results. Electronically Signed   By: Ulyses Jarred M.D.   On: 08/11/2019 00:45    EKG: Independently reviewed.  Normal sinus rhythm with atypical LBBB.  Assessment/Plan Principal Problem:   Acute CVA (cerebrovascular accident) [facility]) Active Problems:   Acute lower UTI   Essential hypertension   Acute encephalopathy   CKD (chronic kidney disease) stage 4, GFR 15-29 ml/min (HCC)   Multiple sclerosis ([place])    1. Possible acute CVA -discussed with on-call neurologist Dr. Lorraine Lax.  At this time plan is to get MRI brain 2D echo.  Patient failed swallow evaluation will need speech therapy consult.  Neurochecks physical therapy consult aspirin check lipid panel hemoglobin A1c likely will need atorvastatin once patient passes swallow. 2. Acute encephalopathy possibly could be from stroke and possible UTI.  Check ammonia levels.  Closely monitor. 3. Possible UTI -get urine cultures.  On ceftriaxone discussed with patient's daughter and patient has tolerated ceftriaxone previously. 4. Recent right-sided nephrectomy secondary to recurrent abscess and now has chronic kidney disease stage III -follow metabolic panel closely. 5. History of hypertension presently taken off antihypertensives recently since blood pressure was in the low normal -allow for permissive hypertension due to possible stroke.  Stroke with rule out will need aggressive management. 6. History of multiple sclerosis on Tysabri. 7. Normocytic normochromic anemia -hemoglobin appears to be at baseline when compared to the recent past.  Follow CBC. 8. Sacral decubitus ulcer and nonhealing wound on the right lower quadrant of the abdomen from recent surgery for which wound team has been consulted.  Given that patient has possible stroke with confusion and UTI will need more than 2 midnight stay in inpatient status.   DVT prophylaxis: Lovenox. Code Status: Full code as confirmed with patient's daughter. Family  Communication: Patient's daughter. Disposition Plan: Home when stable. Consults called: Neurology. Admission status: Inpatient.   Rise Patience MD [facility] Pager [phone].  If 7PM-7AM, please contact night-coverage [email or web address] Password [email or web address]  08/11/2019, 5:26 AM

## 2019-08-11 NOTE — Progress Notes (Signed)
  Echocardiogram 2D Echocardiogram has been performed.  Jennette Dubin 08/11/2019, 3:44 PM

## 2019-08-11 NOTE — Progress Notes (Signed)
CRITICAL VALUE ALERT  Critical Value: troponin 344 Date & Time Notied:  08/11/2019 1452  Provider Notified: Dr Lamonte Sakai  Orders Received/Actions taken: text paged MD to make aware

## 2019-08-11 NOTE — ED Notes (Addendum)
Attempted 18g IV for CTP without success.  Daughter states pt is a difficult stick and they usually attempt 7-8 times.  Recently had PICC line removed.  CT notified unable to get 18g and IV consult placed.  Pt on portable cardiac monitor.

## 2019-08-11 NOTE — ED Notes (Signed)
Pt transported to MRI with RT and RN

## 2019-08-11 NOTE — Sedation Documentation (Addendum)
Pt intubated successfully. Color change noted. 25' @ the lip

## 2019-08-11 NOTE — Consult Note (Signed)
NAMELaurette Bruce, MRN:  [id number], DOB:  1956-08-10, LOS: 0 ADMISSION DATE:  08/11/2019, CONSULTATION DATE:  10/26 REFERRING MD:  danford, CHIEF COMPLAINT:  Cardiac arrest    Brief History   63 year old female with complicated medical history as listed below.  Admitted initially on 10/26 with a working diagnosis of rule out acute CVA, plus minus concern for urinary tract infection.  Was premedicated for MRI with lorazepam, subsequently developed respiratory depression and VF arrest.  Following 1 round of CPR and successful defibrillation was intubated for airway protection and pulmonary asked to evaluate  History of present illness   This is a 63 year old female patient with a complicated medical history.  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  She has a history of multiple sclerosis, and following her most recent hospitalization her baseline functional status was she was awake, oriented, ambulatory but had unsteady gait requiring stabilization with nearby chairs and wall.  Family notes she was in her usual state of health up until 10/21 at that point family noted she was "off" from the 22nd through the 26th family noted her to have decreased p.o. intake, worsening weakness.  The family was watching her a bit closer than usual because of this given concern of prior history of infections.  During the evening hours of 10/25 she was complaining of headache, daughter checked on her and found her to have worsening right-sided weakness and right facial droop.  She was initially brought to the med center at [facility] as a code stroke.  She did have some nausea and vomiting.  Initial CT of head was followed by CT angiogram these were both negative for large vessel obstructions.  They did note some improvement of right upper extremity strength as well as right facial droop.  She was evaluated by telemetry neurology who offered TPA but family declined given  recent surgery.  She was to be admitted with working diagnosis of rule out CVA, was premedicated with lorazepam for MRI, developed respiratory depression followed by VF arrest requiring 1 round of CPR, and defibrillation.  Was given Romazicon postarrest, was intubated for airway protection.  Pulmonary asked to evaluate.  Past Medical History   Multiple sclerosis, right sided weakness, prior right-sided nephrectomy for recurrent renal abscess, hypertension, history of colon cancer Significant Hospital Events   10/26 admitted initially with confusion, right-sided weakness with spastic movement of the right upper extremity; was being worked up for stroke.  While undergoing diagnostic evaluation had received Received lorazepam for MRI.  Developed respiratory depression, followed by agonal respiratory efforts, and then found to be in ventricular fibrillation.  CPR was initiated, was defibrillated x1 successfully, was given Romazicon, intubated for airway protection post arrest  Consults:  Neurology 10/26  Procedures:  Intubation 10/26 CPR 10/26   Significant Diagnostic Tests:  UDS negative  CT head/CTA 10/26: No intracranial arterial occlusion or high-grade stenosis.  No dissection, aneurysm or stenosis.  No mass, hemorrhage or acute findings EEG 10/26>>> MRI 10/26>>>  Micro Data:  Urine culture 10/26>>> Blood culture x2 10/26>>> COVID-19 10/26>>>  Antimicrobials:  Ceftriaxone 10/26  Interim history/subjective:  Sedated  Objective   Blood pressure 122/67, pulse 94, temperature 98.5 F (36.9 C), temperature source Oral, resp. rate 19, height 5\' 2"  (1.575 m), weight 59.8 kg, SpO2 100 %.    Vent Mode: PRVC FiO2 (%):  [15 %-100 %] 100 % Set Rate:  [14 bmp-18 bmp] 18 bmp Vt Set:  [400  mL-540 mL] 400 mL PEEP:  [5 [place] Pressure:  [8 cmH20-13 cmH20] 13 cmH20  No intake or output data in the 24 hours ending 08/11/19 0835 Filed Weights   08/11/19 0051  Weight: 59.8 kg     Examination: General: Chronically ill appearing white female currently sedated on propofol infusion and supported on mechanical ventilation HENT: Orally intubated mucous membranes moist sclera nonicteric pupils equal and reactive Lungs: Diffuse and scattered rhonchi equal chest rise Cardiovascular: Regular rate and rhythm without audible murmur Abdomen: Soft, positive bowel sounds.  Ostomy unremarkable, right lower quadrant surgical staple site healed, has open wound packed on right lower abdomen below old surgical staple sites.  Packing with purulent discharge Extremities: Warm and dry brisk capillary refill minimal pitting edema Neuro: Does not open eyes.  Flexing left upper, decerebrate posturing of right upper, bilateral lower extremity upward extension of toes positive cough GU: Due to void  Resolved Hospital Problem list     Assessment & Plan:  Acute metabolic encephalopathy complicated further by brief cardiac arrest and underlying MS -Differential diagnosis includes acute CVA given right-sided weakness, also consider sepsis exacerbating underlying MS symptoms.  Doubt seizure by description of presenting symptoms however remains on differential for now.  The addition of cardiac arrest raises concern about complicating anoxic injury -Seen by neurology, appreciate their assistance Plan Continuing supportive care Serial neuro checks MRI and EEG Aspirin Echocardiogram Rule out infection Additional neurodiagnostics per neurology  VF arrest -Seemingly exacerbated by respiratory depression Plan Supportive care Telemetry monitoring Follow-up electrolytes Cycle cardiac enzymes Follow-up echocardiogram  Acute respiratory failure s/p cardiopulmonary arrest  Portable chest x-ray personally reviewed: This demonstrates the endotracheal tube is in satisfactory position however could be retracted at least 1 cm bilateral chest fields are clear Plan Pull endotracheal back 1 cm Full  ventilatory support, follow-up ABG on current vent settings PAD protocol with RASS goal -1 VAP bundle A.m. chest x-ray  Sepsis. Potential source: urinary tract also consider surgical site/wound infection.  Urinalysis was concerning for possible infection.  She has a history of recurrent urinary tract infections.  I also am concerned about her wound Plan We will add blood cultures to already sent urine culture Continue ceftriaxone, day 1 Low threshold to widen antibiotics Wound ostomy consult  AKI superimposed on prior right Nephrectomy (Jul 22 2019) Plan Renal dose medications Ensure adequate mean arterial pressure Strict intake and output  Anemia, no evidence of bleeding Plan Trend CBC Transfuse for hemoglobin less than 7    Best practice:  Diet: N.p.o. Pain/Anxiety/Delirium protocol (if indicated): 10/26 VAP protocol (if indicated): 10/26 DVT prophylaxis: LMWH GI prophylaxis: PPI Glucose control: Sliding scale insulin Mobility: None bedrest Code Status: Full code Family Communication: Updated plan of care at bedside Disposition:   Labs   CBC: Recent Labs  Lab 08/11/19 0045  WBC 10.5  NEUTROABS 6.3  HGB 9.9*  HCT 32.0*  MCV 95.5  PLT 411*    Basic Metabolic Panel: Recent Labs  Lab 08/11/19 0045  NA 137  K 3.5  CL 100  CO2 22  GLUCOSE 146*  BUN 17  CREATININE 1.49*  CALCIUM 9.1   GFR: Estimated Creatinine Clearance: 29.8 mL/min (A) (by C-G formula based on SCr of 1.49 mg/dL (H)). Recent Labs  Lab 08/11/19 0045  WBC 10.5    Liver Function Tests: Recent Labs  Lab 08/11/19 0045  AST 45*  ALT 57*  ALKPHOS 101  BILITOT 0.8  PROT 6.6  ALBUMIN 3.4*  No results for input(s): LIPASE, AMYLASE in the last 168 hours. No results for input(s): AMMONIA in the last 168 hours.  ABG No results found for: PHART, PCO2ART, PO2ART, HCO3, TCO2, ACIDBASEDEF, O2SAT   Coagulation Profile: Recent Labs  Lab 08/11/19 0045  INR 1.1    Cardiac  Enzymes: No results for input(s): CKTOTAL, CKMB, CKMBINDEX, TROPONINI in the last 168 hours.  HbA1C: No results found for: HGBA1C  CBG: Recent Labs  Lab 08/11/19 0028  GLUCAP 124*    Review of Systems:   Not able  Past Medical History  She,  has a past medical history of Cancer ([place]), Hypertension, Multiple sclerosis ([place]), and Renal disorder.   Surgical History    Past Surgical History:  Procedure Laterality Date   NEPHRECTOMY TRANSPLANTED ORGAN       Social History   reports that she has never smoked. She has never used smokeless tobacco. She reports previous alcohol use. She reports that she does not use drugs.   Family History   Her family history is negative for Diabetes Mellitus II.   Allergies Allergies  Allergen Reactions   Invanz [Ertapenem] Anaphylaxis   Mirabegron Other (See Comments)    Caused Hypertension   Claritin-D 12 Hour [Loratadine-Pseudoephedrine Er] Rash   Penicillins Rash    Pt tolerates cephalosporins  Did it involve swelling of the face/tongue/throat, SOB, or low BP? No Did it involve sudden or severe rash/hives, skin peeling, or any reaction on the inside of your mouth or nose? No Did you need to seek medical attention at a hospital or doctor's office? No When did it last happen?A long time ago per daughter  If all above answers are NO, may proceed with cephalosporin use.      Home Medications  Prior to Admission medications   Medication Sig Start Date End Date Taking? Authorizing Provider  calcium-vitamin D (OSCAL WITH D) 500-200 MG-UNIT tablet Take 1 tablet by mouth.   Yes [provider]  conjugated estrogens (PREMARIN) vaginal cream Place 1 Applicatorful vaginally at bedtime.   Yes [provider]  escitalopram (LEXAPRO) 10 MG tablet Take 10 mg by mouth daily.   Yes [provider]  magnesium oxide (MAG-OX) 400 MG tablet Take 400 mg by mouth 2 (two) times daily.   Yes [provider]   natalizumab (TYSABRI) 300 MG/15ML injection Inject into the vein every 6 (six) weeks.   Yes [provider]  omeprazole (PRILOSEC) 20 MG capsule Take 20 mg by mouth 2 (two) times daily before a meal.   Yes [provider]  ondansetron (ZOFRAN) 4 MG tablet Take 4 mg by mouth daily as needed for nausea/vomiting. 06/02/19  Yes [provider]  oxyCODONE (OXY IR/ROXICODONE) 5 MG immediate release tablet Take 5 mg by mouth every 4 (four) hours as needed for pain. 06/04/19  Yes [provider]  potassium chloride SA (KLOR-CON) 20 MEQ tablet Take 20 mEq by mouth 2 (two) times daily.   Yes [provider]  Probiotic Product (PROBIOTIC DAILY PO) Take 1 capsule by mouth daily.   Yes [provider]     Critical care time: 45 minutes      Erick Colace ACNP-BC [place] Pager # [phone] OR # [phone] if no answer

## 2019-08-11 NOTE — Procedures (Signed)
Patient Name: Jeanne Bruce  MRN: [id number]  Epilepsy Attending: Lora Havens  Referring Physician/Provider: Salvadore Dom, NP Date: 08/11/2019  Duration: 26.06 mins  Patient history: 63yo F with cardiac arrest. EEG to evaluate for seizure  Level of alertness: coma/sedated  AEDs during EEG study: propofol  Technical aspects: This EEG study was done with scalp electrodes positioned according to the 10-20 International system of electrode placement. Electrical activity was acquired at a sampling rate of 500Hz  and reviewed with a high frequency filter of 70Hz  and a low frequency filter of 1Hz . EEG data were recorded continuously and digitally stored.   DESCRIPTION: EEG showed continuous low voltage 2-3hz  delta slowing with over riding 13-15hz  beta activity which at times appears sharply contoured. EEG was reactive to tactile stimuli. Hyperventilation and photic stimulation were not performed.  ABNORMALITY - Continuous slow,generalized - Excessive fast, generalized  IMPRESSION: This study is suggestive of severe diffuse encephalopathy, non specific etiology.  No seizures or definite epileptiform discharges were seen throughout the recording.  The excessive beta activity seen in the background can be the effect of medications like benzodiazepine and is a benign EEG pattern.

## 2019-08-11 NOTE — Consult Note (Signed)
[place] Nurse wound consult note Reason for Consult: Stage 2 pressure injury to sacrum.  Present on admission. RLQ JP Drain site  LLQ colostomy  Wound type:Stage 2 pressure injury Pressure Injury POA: Yes Measurement: 2 cm x 3 cm x 0.2 cm  Wound VTV:NRWC pink Drainage (amount, consistency, odor) minimal serosanguinous  No odor Periwound: intact Dressing procedure/placement/frequency: Cleanse sacral wound with soap and water and pat dry.  Apply sacral foam dressing.  Change every three days and PRN soilage.  Will not follow at this time.  Please re-consult if needed.  Domenic Moras MSN, RN, FNP-BC CWON Wound, Ostomy, Continence Nurse Pager [phone]

## 2019-08-11 NOTE — ED Notes (Signed)
Neurologist at bedside to assess pt.

## 2019-08-11 NOTE — Progress Notes (Signed)
Chaplain responded to a request to support family for this patient as CPR had been initiated.  Chaplain arrived and patient's daughter was in the room.  She is a Marine scientist with oncology at [employer] and words as nurse for [employer].  Chaplain offered empathetic/reflective listening and words of comfort as well as offering hospitality.  Patient's husband arrived, Jeanne Bruce, he and the patient have been married 26 years, Chaplain escorted Jeanne Bruce to bedside.Daughter described family as people of faith.  Patient is in the process of being admitted.  Chaplain will have day Chaplain follow up with the family.  No other needs expressed.  Chaplain available as needed. [place], MDiv.    08/11/19 [phone]  Clinical Encounter Type  Visited With Family;Health care provider  Visit Type ED;Critical Care  Referral From Nurse  Consult/Referral To Chaplain  Stress Factors  Family Stress Factors Health changes

## 2019-08-11 NOTE — ED Notes (Signed)
SUGAR 124

## 2019-08-11 NOTE — Progress Notes (Signed)
Discussed with Drs. Byrum and Roxanne Mins.  Patient had an event shortly before 7AM, which we suspect was a respiratory arrest.    Dr. Lamonte Sakai has graciously agreed to assume care. She is now intubated and will be transferred to 4N ICU for ongoing work up and management.  Please contact the hospitalist service to re-assume care as she progresses on her recovery and is ready for transfer out of ICU.

## 2019-08-11 NOTE — Progress Notes (Signed)
eLink Physician-Brief Progress Note Patient Name: Jeanne Bruce DOB: 10-03-1956 MRN: [id number]   Date of Service  08/11/2019  HPI/Events of Note  Multiple issues: 1. Troponin = 334 --> 229 in setting of post cardiac arrest. Already on ASA. EKG reveals normal sinus rhythm. Left axis deviation. Left bundle branch block and K+ = 3.9, Mg++ = 2.4 and Creatinine = 1.90.   eICU Interventions  Will order: 1. Replace K+. 2. Continue present management.      Intervention Category Major Interventions: Other:  Lysle Dingwall 08/11/2019, 11:38 PM

## 2019-08-11 NOTE — ED Notes (Signed)
Leaving with ems at this time.  Belongings given to family.

## 2019-08-11 NOTE — ED Notes (Signed)
IV team at bedside 

## 2019-08-11 NOTE — Progress Notes (Signed)
SLP Cancellation Note  Patient Details Name: Jeanne Bruce MRN: [id number] DOB: [birth year]   Cancelled treatment:       Reason Eval/Treat Not Completed: Medical issues which prohibited therapy. Orders received for BSE/SLE. Pt currently intubated. SLP will follow to assess readiness to proceed with evaluations. Thank you for this referral.  Christinia Lambeth B. Quentin Ore, [facility], [place] Speech Language Pathologist Office: [phone] Pager: [phone]  Shonna Chock 08/11/2019, 3:14 PM

## 2019-08-11 NOTE — Progress Notes (Signed)
EEG complete - results pending 

## 2019-08-11 NOTE — ED Triage Notes (Addendum)
Pt assisted out of POV and family reported sudden onset of HA, garbled speech, and unilateral weakness. Pt taken directly to Rm 3 and EDP responded to bedside.   Upon further discussion with the family pt was last seen normal watching TV at 10:30. Pt then walked into the bedroom crying of a sudden severe HA, with the garbled speech.

## 2019-08-11 NOTE — ED Notes (Signed)
Pt pulled from hallway into ED treatment room to be cleaned.  Incontinent of urine.  MRI notified that pt is ready for exam after Ativan is administered.  Daughter remains at bedside.

## 2019-08-11 NOTE — Consult Note (Signed)
[facility] TeleNeurology Consult Services   Date of Service:   08/11/2019 00:33:05  Impression:     .  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 Her NIH is 22. Initially we were told that her nephrectomy was on 12 October. Based on that she was considered a contraindication for IV TPA and was sent for CT angiogram of the head. The CT angiogram finally came back as negative for LVO. In the meantime we were discussing the case with the family in detail and the family found out that the surgery actually was on 6 October and on 12th, she went for wound drain removal. We talked with the family in detail after this. We talked about pros and cons of IV TPA with the daughter who is an Therapist, sports, her husband, ED attending, myself and another daughter of the patient on the telephone. We spent approximately 30 minutes or so just discussing the pros and cons of IV TPA, other treatment options, prognosis etc. After discussing this in detail with the family, they have decided that they do not want to proceed with TPA because of risks of bleeding. Patient is going to be admitted to the hospital for MRI of the head and further stroke work-up. Will need neurology follow-up also  Metrics: Last Known Well: 08/10/2019 22:00:00 [facility] Notification Time: 08/11/2019 00:33:05 Arrival Time: 08/11/2019 00:23:00 Stamp Time: 08/11/2019 00:33:05 Time First Login Attempt: 08/11/2019 00:35:11 Video Start Time: 08/11/2019 00:35:11  Symptoms: Right sided weakness NIHSS Start Assessment Time: 08/11/2019 00:45:20 Patient is not a candidate for Alteplase/Activase. Patient was not deemed candidate for Alteplase/Activase thrombolytics because of family decision. Video End Time: 08/11/2019 01:46:31  CT head showed no acute hemorrhage or acute core infarct.  Clinical Presentation is Suggestive of Large  Vessel Occlusive Disease, Recommendations are as Follows  CTA Head and Neck.   ED Physician notified of diagnostic impression and management plan on 08/11/2019 00:54:06  Our recommendations are outlined below.  Recommendations:     .  Activate Stroke Protocol Admission/Order Set     .  Stroke/Telemetry Floor     .  Neuro Checks     .  Bedside Swallow Eval     .  DVT Prophylaxis     .  IV Fluids, Normal Saline     .  Head of Bed 30 Degrees     .  Euglycemia and Avoid Hyperthermia (PRN Acetaminophen)     .  Antiplatelet Therapy Recommended    Sign Out:     .  Discussed with Emergency Department Provider    ------------------------------------------------------------------------------  History of Present Illness: Patient is a 63 year old Female.  Patient was brought by EMS for symptoms of Right sided weakness  63 year old female with history of colon cancer, multiple sclerosis came to the hospital because of sudden onset right-sided. She was also noted to be aphasic. The symptom onset is around 10 PM she was watching TV when suddenly was noted to have some speech slurring. Patient is weak on the right side. She is afraid she cannot following commands or answering questions. 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 She is on Lovenox 30 mg subcu daily since then. Baseline MRS is 4  Last seen normal was within 4.5 hours. There is no history of recent major surgery.  Past Medical History:     .  Hypertension     . There is NO history of Diabetes Mellitus     . There is NO history of Hyperlipidemia     . There is NO history of Atrial Fibrillation     . There is NO history of Coronary Artery Disease     . There is NO history of Stroke  Anticoagulant use:  Levonox 30 daily  Antiplatelet use: No  Examination: BP(150/63), Pulse(88), Blood Glucose(147) 1A: Level of Consciousness - Alert; keenly responsive + 0 1B: Ask Month  and Age - Aphasic + 2 1C: Blink Eyes & Squeeze Hands - Performs 0 Tasks + 2 2: Test Horizontal Extraocular Movements - Forced Gaze Palsy: Cannot Be Overcome + 2 3: Test Visual Fields - Complete Hemianopia + 2 4: Test Facial Palsy (Use Grimace if Obtunded) - Partial paralysis (lower face) + 2 5A: Test Left Arm Motor Drift - No Drift for 10 Seconds + 0 5B: Test Right Arm Motor Drift - No Effort Against Gravity + 3 6A: Test Left Leg Motor Drift - No Drift for 5 Seconds + 0 6B: Test Right Leg Motor Drift - No Effort Against Gravity + 3 7: Test Limb Ataxia (FNF/Heel-Shin) - No Ataxia + 0 8: Test Sensation - Mild-Moderate Loss: Less Sharp/More Dull + 1 9: Test Language/Aphasia - Mute/Global Aphasia: No Usable Speech/Auditory Comprehension + 3 10: Test Dysarthria - Normal + 0 11: Test Extinction/Inattention - Profound hemi-inattention (ex: does not recognize own hand) + 2  NIHSS Score: 22  Patient/Family was informed the Neurology Consult would happen via TeleHealth consult by way of interactive audio and video telecommunications and consented to receiving care in this manner.   Due to the immediate potential for life-threatening deterioration due to underlying acute neurologic illness, I spent 70 minutes providing critical care. This time includes time for face to face visit via telemedicine, review of medical records, imaging studies and discussion of findings with providers, the patient and/or family.   Dr Faustino Congress   [facility] [phone]  Case [id number]

## 2019-08-11 NOTE — Progress Notes (Signed)
eLink Physician-Brief Progress Note Patient Name: Jeanne Bruce DOB: 1956/01/12 MRN: [id number]   Date of Service  08/11/2019  HPI/Events of Note  Multiple issues: 1. Troponin = 344 is setting of post cardiac arrest and 2. No AM Labs ordered.   eICU Interventions  Will order: 1. Troponin now. 2. BMP and Mg++ level now. 3. CBC with platelets, BMP and Mg++ level in AM.     Intervention Category Major Interventions: Other:  [name],[name] [name] 08/11/2019, 8:21 PM

## 2019-08-11 NOTE — Consult Note (Signed)
Requesting Physician: Dr. Hal Hope    Chief Complaint: Right side weakness, confusion  History obtained from: Patient and Chart     HPI:                                                                                                                                       Jeanne Bruce is a 63 y.o. female with past medical history significant of multiple sclerosis, colon cancer, hypertension, renal disorder status post nephrectomy on October 12,2020 presented to [facility] emergency room with sudden onset confusion, headache and right-sided weakness.  According to the patient's husband, she was doing fine until around 10 PM she suddenly complained of a headache and was confused.  He walked her to the bathroom and that is when she became weak on the right side. Patient was evaluated by tele- neurology.  Per his note, there was concern for left MCA syndrome.  tPA was offered-however after discussion with family it appeared that they declined TPA due to risks of hemorrhage due to recent surgery.  CT angiogram was done which was negative for large vessel occlusion.    Upon reviewing the EDP's note admit [facility], patient also had a UTI and there was some concern that this was a stroke mimic.Patient was transferred for urgent MRI and admission for further neurological work-up.  While awaiting MRI brain in the ER, patient was agitated and was given small dose of IV Ativan to obtain MRI.  Patient had difficulty with maintaining oxygen saturation then went into V. fib arrest, CPR was successfully initiated and patient was intubated for airway protection.  Date last known well: 10.25.20 Time last known well: 10.30 pm tPA Given: no, reason as discussed by tele neurology ( see note) Baseline MRS 3-4    Past Medical History:  Diagnosis Date  . Cancer ([place])    colon (resolved)  . Hypertension   . Multiple sclerosis ([place])   . Renal disorder     Past Surgical History:  Procedure  Laterality Date  . NEPHRECTOMY TRANSPLANTED ORGAN      Family History  Problem Relation Age of Onset  . Diabetes Mellitus II Neg Hx    Social History:  reports that she has never smoked. She has never used smokeless tobacco. She reports previous alcohol use. She reports that she does not use drugs.  Allergies:  Allergies  Allergen Reactions  . Invanz [Ertapenem] Anaphylaxis  . Mirabegron Other (See Comments)    Caused Hypertension  . Claritin-D 12 Hour [Loratadine-Pseudoephedrine Er] Rash  . Penicillins Rash    Pt tolerates cephalosporins  Did it involve swelling of the face/tongue/throat, SOB, or low BP? No Did it involve sudden or severe rash/hives, skin peeling, or any reaction on the inside of your mouth or nose? No Did you need to seek medical attention at a hospital or doctor's office? No When did it last  happen?A long time ago per daughter  If all above answers are "NO", may proceed with cephalosporin use.     Medications:                                                                                                                        I reviewed home medications   ROS:                                                                                                                                     UTA due to mental status   Examination:                                                                                                      General: Appears well-developed . Psych: Affect appropriate to situation Eyes: No scleral injection HENT: No OP obstrucion Head: Normocephalic.  Cardiovascular: Normal rate and regular rhythm.  Respiratory: Effort normal and breath sounds normal to anterior ascultation GI: Soft.  No distension. There is no tenderness.  Skin: WDI    Neurological Examination (limited exam due to intubation and sedation) Mental Status: Patient grimaces to pain, not following any commands Cranial Nerves: II: Pupils are equal and  reactive right 17mm, left 15mm III,IV,VI: doll's response present. V,VII: corneal reflex: Present VIII: patient does not respond to verbal stimuli IX,X: gag reflex present XI: trapezius strength unable to test bilaterally XII: tongue strength unable to test Motor: Withdraws briskly in all 4 extremities Deep Tendon Reflexes: 2+ bilateral patellar reflexes Plantars: Downgoing Cerebellar: Unable to perform    Lab Results: Basic Metabolic Panel: Recent Labs  Lab 08/11/19 0045  NA 137  K 3.5  CL 100  CO2 22  GLUCOSE 146*  BUN 17  CREATININE 1.49*  CALCIUM 9.1    CBC: Recent Labs  Lab 08/11/19 0045  WBC 10.5  NEUTROABS 6.3  HGB 9.9*  HCT 32.0*  MCV 95.5  PLT 411*    Coagulation Studies: Recent Labs  08/11/19 0045  LABPROT 13.6  INR 1.1    Imaging: Ct Code Stroke Cta Head W/wo Contrast  Result Date: 08/11/2019 CLINICAL DATA:  Sudden onset headache with unilateral weakness. EXAM: CT ANGIOGRAPHY HEAD AND NECK TECHNIQUE: Multidetector CT imaging of the head and neck was performed using the standard protocol during bolus administration of intravenous contrast. Multiplanar CT image reconstructions and MIPs were obtained to evaluate the vascular anatomy. Carotid stenosis measurements (when applicable) are obtained utilizing NASCET criteria, using the distal internal carotid diameter as the denominator. CONTRAST:  162mL OMNIPAQUE IOHEXOL 350 MG/ML SOLN COMPARISON:  Head CT 08/10/2019 FINDINGS: CT HEAD FINDINGS Brain: There is no mass, hemorrhage or extra-axial collection. The size and configuration of the ventricles and extra-axial CSF spaces are normal. There is no acute or chronic infarction. There is hypoattenuation of the periventricular white matter, most commonly indicating chronic ischemic microangiopathy. Skull: The visualized skull base, calvarium and extracranial soft tissues are normal. Sinuses/Orbits: No fluid levels or advanced mucosal thickening of the visualized  paranasal sinuses. No mastoid or middle ear effusion. The orbits are normal. CTA NECK FINDINGS SKELETON: There is no bony spinal canal stenosis. No lytic or blastic lesion. OTHER NECK: Normal pharynx, larynx and major salivary glands. No cervical lymphadenopathy. Unremarkable thyroid gland. UPPER CHEST: No pneumothorax or pleural effusion. No nodules or masses. AORTIC ARCH: There is no calcific atherosclerosis of the aortic arch. There is no aneurysm, dissection or hemodynamically significant stenosis of the visualized portion of the aorta. Conventional 3 vessel aortic branching pattern. The visualized proximal subclavian arteries are widely patent. RIGHT CAROTID SYSTEM: Normal without aneurysm, dissection or stenosis. LEFT CAROTID SYSTEM: Normal without aneurysm, dissection or stenosis. VERTEBRAL ARTERIES: Codominant configuration. Both origins are clearly patent. There is no dissection, occlusion or flow-limiting stenosis to the skull base (V1-V3 segments). CTA HEAD FINDINGS POSTERIOR CIRCULATION: --Vertebral arteries: Normal V4 segments. --Posterior inferior cerebellar arteries (PICA): Patent origins from the vertebral arteries. --Anterior inferior cerebellar arteries (AICA): Patent origins from the basilar artery. --Basilar artery: Normal. --Superior cerebellar arteries: Normal. --Posterior cerebral arteries: Normal. Both originate from the basilar artery. Posterior communicating arteries (p-comm) are diminutive or absent. ANTERIOR CIRCULATION: --Intracranial internal carotid arteries: Normal. --Anterior cerebral arteries (ACA): Normal. Both A1 segments are present. Patent anterior communicating artery (a-comm). --Middle cerebral arteries (MCA): Normal. VENOUS SINUSES: As permitted by contrast timing, patent. ANATOMIC VARIANTS: None Review of the MIP images confirms the above findings. IMPRESSION: 1. No intracranial arterial occlusion or high-grade stenosis. 2. No dissection, aneurysm or hemodynamically  significant stenosis of the major cervical or intracranial arteries. Electronically Signed   By: Ulyses Jarred M.D.   On: 08/11/2019 01:39   Ct Code Stroke Cta Neck W/wo Contrast  Result Date: 08/11/2019 CLINICAL DATA:  Sudden onset headache with unilateral weakness. EXAM: CT ANGIOGRAPHY HEAD AND NECK TECHNIQUE: Multidetector CT imaging of the head and neck was performed using the standard protocol during bolus administration of intravenous contrast. Multiplanar CT image reconstructions and MIPs were obtained to evaluate the vascular anatomy. Carotid stenosis measurements (when applicable) are obtained utilizing NASCET criteria, using the distal internal carotid diameter as the denominator. CONTRAST:  133mL OMNIPAQUE IOHEXOL 350 MG/ML SOLN COMPARISON:  Head CT 08/10/2019 FINDINGS: CT HEAD FINDINGS Brain: There is no mass, hemorrhage or extra-axial collection. The size and configuration of the ventricles and extra-axial CSF spaces are normal. There is no acute or chronic infarction. There is hypoattenuation of the periventricular white matter, most commonly indicating chronic ischemic microangiopathy. Skull: The visualized skull base,  calvarium and extracranial soft tissues are normal. Sinuses/Orbits: No fluid levels or advanced mucosal thickening of the visualized paranasal sinuses. No mastoid or middle ear effusion. The orbits are normal. CTA NECK FINDINGS SKELETON: There is no bony spinal canal stenosis. No lytic or blastic lesion. OTHER NECK: Normal pharynx, larynx and major salivary glands. No cervical lymphadenopathy. Unremarkable thyroid gland. UPPER CHEST: No pneumothorax or pleural effusion. No nodules or masses. AORTIC ARCH: There is no calcific atherosclerosis of the aortic arch. There is no aneurysm, dissection or hemodynamically significant stenosis of the visualized portion of the aorta. Conventional 3 vessel aortic branching pattern. The visualized proximal subclavian arteries are widely patent.  RIGHT CAROTID SYSTEM: Normal without aneurysm, dissection or stenosis. LEFT CAROTID SYSTEM: Normal without aneurysm, dissection or stenosis. VERTEBRAL ARTERIES: Codominant configuration. Both origins are clearly patent. There is no dissection, occlusion or flow-limiting stenosis to the skull base (V1-V3 segments). CTA HEAD FINDINGS POSTERIOR CIRCULATION: --Vertebral arteries: Normal V4 segments. --Posterior inferior cerebellar arteries (PICA): Patent origins from the vertebral arteries. --Anterior inferior cerebellar arteries (AICA): Patent origins from the basilar artery. --Basilar artery: Normal. --Superior cerebellar arteries: Normal. --Posterior cerebral arteries: Normal. Both originate from the basilar artery. Posterior communicating arteries (p-comm) are diminutive or absent. ANTERIOR CIRCULATION: --Intracranial internal carotid arteries: Normal. --Anterior cerebral arteries (ACA): Normal. Both A1 segments are present. Patent anterior communicating artery (a-comm). --Middle cerebral arteries (MCA): Normal. VENOUS SINUSES: As permitted by contrast timing, patent. ANATOMIC VARIANTS: None Review of the MIP images confirms the above findings. IMPRESSION: 1. No intracranial arterial occlusion or high-grade stenosis. 2. No dissection, aneurysm or hemodynamically significant stenosis of the major cervical or intracranial arteries. Electronically Signed   By: Ulyses Jarred M.D.   On: 08/11/2019 01:39   Ct Head Code Stroke Wo Contrast  Result Date: 08/11/2019 CLINICAL DATA:  Code stroke.  Headache with unilateral weakness. EXAM: CT HEAD WITHOUT CONTRAST TECHNIQUE: Contiguous axial images were obtained from the base of the skull through the vertex without intravenous contrast. COMPARISON:  None. FINDINGS: Brain: There is no mass, hemorrhage or extra-axial collection. The size and configuration of the ventricles and extra-axial CSF spaces are normal. There is hypoattenuation of the periventricular white matter, most  commonly indicating chronic ischemic microangiopathy. Vascular: No abnormal hyperdensity of the major intracranial arteries or dural venous sinuses. No intracranial atherosclerosis. Skull: The visualized skull base, calvarium and extracranial soft tissues are normal. Sinuses/Orbits: No fluid levels or advanced mucosal thickening of the visualized paranasal sinuses. No mastoid or middle ear effusion. The orbits are normal. ASPECTS [facility] Stroke Program Early CT Score) - Ganglionic level infarction (caudate, lentiform nuclei, internal capsule, insula, M1-M3 cortex): 7 - Supraganglionic infarction (M4-M6 cortex): 3 Total score (0-10 with 10 being normal): 10 IMPRESSION: 1. Chronic small vessel disease.  No intracranial hemorrhage. 2. ASPECTS is 10. 3. These results were called by telephone at the time of interpretation on 08/11/2019 at 12:45 am to Dr. Thayer Jew , who verbally acknowledged these results. Electronically Signed   By: Ulyses Jarred M.D.   On: 08/11/2019 00:45     ASSESSMENT AND PLAN   63 y.o. female with past medical history significant of multiple sclerosis, colon cancer, hypertension, renal disorder status post nephrectomy on October 12,2020 presented with sudden onset confusion, headache and right-sided weakness. tPA not administered due to family declining.  CTA negative for large vessel occlusion. MRI brain pending, course complicated with V. fib arrest following Ativan administration for MRI.   Confusion and right-sided weakness, stroke to be  ruled out History of multiple sclerosis Urinary tract infection Acute metabolic encephalopathy  Recommendations MRI brain without contrast   Please page neurology team for further recommendations once MRI brain is completed     [facility] Pager Number [phone]

## 2019-08-11 NOTE — Progress Notes (Signed)
Patient seen by Dr. Lorraine Lax earlier.  After the arrest, she is coming around some, moving her left side purposefully, slightly increased tone with weakness on the right.  No clonic or dystonic type movements.  She will need an MRI, I will add an EEG, with further recommendations following this testing.  Roland Rack, MD [facility] [phone]  If 7pm- 7am, please page neurology on call as listed in [place].

## 2019-08-11 NOTE — ED Provider Notes (Addendum)
[facility] Provider Note   CSN: [id number] Arrival date & time: 08/11/19  0023     History   Chief Complaint Chief Complaint  Patient presents with   Code Stroke    HPI Jeanne Bruce is a 63 y.o. female.     HPI  This is a 63 year old female with a history of colon cancer, hypertension, MS, recent right-sided nephrectomy who presents with speech disturbance, right-sided weakness, and headache.  History is taken mainly from the patient's husband and daughter.  Husband reports last seen her normal at 10:30 PM.  Just prior to arrival she walked into the bedroom and was reporting "a severe headache" and was noted to have speech disturbance and right arm weakness.  On my evaluation, patient does not contribute all to the history taking.  She appears to have both an expressive and a receptive aphasia and drift on the right upper and lower extremity.  Code stroke was initiated.  Level 5 caveat for acuity of condition and nonverbal  I reviewed the patient's chart.  She was seen at [facility] and was admitted from October 6 to October 15.  She had a nephrectomy on October 6 and subsequently had an exploration of her JP drain wound on October 13.  Past Medical History:  Diagnosis Date   Cancer [facility])    colon (resolved)   Hypertension    Multiple sclerosis ([place])    Renal disorder     There are no active problems to display for this patient.   Past Surgical History:  Procedure Laterality Date   NEPHRECTOMY TRANSPLANTED ORGAN       OB History   No obstetric history on file.      Home Medications    Prior to Admission medications   Medication Sig Start Date End Date Taking? Authorizing Provider  calcium-vitamin D (OSCAL WITH D) 500-200 MG-UNIT tablet Take 1 tablet by mouth.   Yes [provider]  escitalopram (LEXAPRO) 10 MG tablet Take 10 mg by mouth daily.   Yes [provider]  magnesium oxide (MAG-OX) 400 MG  tablet Take 400 mg by mouth 2 (two) times daily.   Yes [provider]  omeprazole (PRILOSEC) 20 MG capsule Take 20 mg by mouth 2 (two) times daily before a meal.   Yes [provider]  potassium chloride SA (KLOR-CON) 20 MEQ tablet Take 20 mEq by mouth 2 (two) times daily.   Yes [provider]    Family History No family history on file.  Social History Social History   Tobacco Use   Smoking status: Not on file  Substance Use Topics   Alcohol use: Not on file   Drug use: Not on file     Allergies   Invanz [ertapenem], Mirabegron, and Penicillins   Review of Systems Review of Systems  Unable to perform ROS: Patient nonverbal  Neurological: Positive for speech difficulty, weakness and headaches.     Physical Exam Updated Vital Signs BP (!) 173/96    Pulse (!) 59    Temp (!) 97 F (36.1 C) (Axillary)    Resp 14    Ht 1.575 m (5\' 2" )    Wt 59.8 kg    SpO2 99%    BMI 24.11 kg/m   Physical Exam Vitals signs and nursing note reviewed.  Constitutional:      Appearance: She is well-developed.     Comments: Ill-appearing and uncomfortable appearing  HENT:     Head:  Normocephalic and atraumatic.     Mouth/Throat:     Mouth: Mucous membranes are dry.  Eyes:     Pupils: Pupils are equal, round, and reactive to light.     Comments: Pupils 4 mm with left pupil more sluggish, slight leftward gaze noted  Neck:     Musculoskeletal: Neck supple.  Cardiovascular:     Rate and Rhythm: Normal rate and regular rhythm.     Heart sounds: Normal heart sounds.  Pulmonary:     Effort: Pulmonary effort is normal. No respiratory distress.     Breath sounds: No wheezing.  Abdominal:     General: Bowel sounds are normal.     Palpations: Abdomen is soft.     Tenderness: There is abdominal tenderness.     Comments: Ostomy noted mid abdomen, large surgical incision noted over the right abdomen and flank clean dry and intact, inferior to scar there is a open  wound consistent with prior JP drain, no surrounding erythema  Skin:    General: Skin is warm and dry.  Neurological:     Mental Status: She is alert.     Comments: Unable to assess orientation, expressive aphasia noted, patient does not clearly follow commands, decreased grip strength on the right with a drift, facial symmetry noted  Psychiatric:        Mood and Affect: Mood normal.      ED Treatments / Results  Labs (all labs ordered are listed, but only abnormal results are displayed) Labs Reviewed  CBC - Abnormal; Notable for the following components:      Result Value   RBC 3.35 (*)    Hemoglobin 9.9 (*)    HCT 32.0 (*)    RDW 16.7 (*)    Platelets 411 (*)    nRBC 0.5 (*)    All other components within normal limits  DIFFERENTIAL - Abnormal; Notable for the following components:   Abs Immature Granulocytes 0.19 (*)    All other components within normal limits  COMPREHENSIVE METABOLIC PANEL - Abnormal; Notable for the following components:   Glucose, Bld 146 (*)    Creatinine, Ser 1.49 (*)    Albumin 3.4 (*)    AST 45 (*)    ALT 57 (*)    GFR calc non Af Amer 36 (*)    GFR calc Af Amer 42 (*)    All other components within normal limits  URINALYSIS, ROUTINE W REFLEX MICROSCOPIC - Abnormal; Notable for the following components:   Color, Urine STRAW (*)    APPearance HAZY (*)    Hgb urine dipstick TRACE (*)    Ketones, ur 15 (*)    Protein, ur 100 (*)    Leukocytes,Ua SMALL (*)    All other components within normal limits  URINALYSIS, MICROSCOPIC (REFLEX) - Abnormal; Notable for the following components:   Bacteria, UA FEW (*)    All other components within normal limits  CBG MONITORING, ED - Abnormal; Notable for the following components:   Glucose-Capillary 124 (*)    All other components within normal limits  SARS CORONAVIRUS 2 (TAT 6-24 HRS)  URINE CULTURE  ETHANOL  PROTIME-INR  APTT  RAPID URINE DRUG SCREEN, HOSP PERFORMED     EKG None  Radiology Ct Code Stroke Cta Head W/wo Contrast  Result Date: 08/11/2019 CLINICAL DATA:  Sudden onset headache with unilateral weakness. EXAM: CT ANGIOGRAPHY HEAD AND NECK TECHNIQUE: Multidetector CT imaging of the head and neck was performed using the standard  protocol during bolus administration of intravenous contrast. Multiplanar CT image reconstructions and MIPs were obtained to evaluate the vascular anatomy. Carotid stenosis measurements (when applicable) are obtained utilizing NASCET criteria, using the distal internal carotid diameter as the denominator. CONTRAST:  158mL OMNIPAQUE IOHEXOL 350 MG/ML SOLN COMPARISON:  Head CT 08/10/2019 FINDINGS: CT HEAD FINDINGS Brain: There is no mass, hemorrhage or extra-axial collection. The size and configuration of the ventricles and extra-axial CSF spaces are normal. There is no acute or chronic infarction. There is hypoattenuation of the periventricular white matter, most commonly indicating chronic ischemic microangiopathy. Skull: The visualized skull base, calvarium and extracranial soft tissues are normal. Sinuses/Orbits: No fluid levels or advanced mucosal thickening of the visualized paranasal sinuses. No mastoid or middle ear effusion. The orbits are normal. CTA NECK FINDINGS SKELETON: There is no bony spinal canal stenosis. No lytic or blastic lesion. OTHER NECK: Normal pharynx, larynx and major salivary glands. No cervical lymphadenopathy. Unremarkable thyroid gland. UPPER CHEST: No pneumothorax or pleural effusion. No nodules or masses. AORTIC ARCH: There is no calcific atherosclerosis of the aortic arch. There is no aneurysm, dissection or hemodynamically significant stenosis of the visualized portion of the aorta. Conventional 3 vessel aortic branching pattern. The visualized proximal subclavian arteries are widely patent. RIGHT CAROTID SYSTEM: Normal without aneurysm, dissection or stenosis. LEFT CAROTID SYSTEM: Normal without  aneurysm, dissection or stenosis. VERTEBRAL ARTERIES: Codominant configuration. Both origins are clearly patent. There is no dissection, occlusion or flow-limiting stenosis to the skull base (V1-V3 segments). CTA HEAD FINDINGS POSTERIOR CIRCULATION: --Vertebral arteries: Normal V4 segments. --Posterior inferior cerebellar arteries (PICA): Patent origins from the vertebral arteries. --Anterior inferior cerebellar arteries (AICA): Patent origins from the basilar artery. --Basilar artery: Normal. --Superior cerebellar arteries: Normal. --Posterior cerebral arteries: Normal. Both originate from the basilar artery. Posterior communicating arteries (p-comm) are diminutive or absent. ANTERIOR CIRCULATION: --Intracranial internal carotid arteries: Normal. --Anterior cerebral arteries (ACA): Normal. Both A1 segments are present. Patent anterior communicating artery (a-comm). --Middle cerebral arteries (MCA): Normal. VENOUS SINUSES: As permitted by contrast timing, patent. ANATOMIC VARIANTS: None Review of the MIP images confirms the above findings. IMPRESSION: 1. No intracranial arterial occlusion or high-grade stenosis. 2. No dissection, aneurysm or hemodynamically significant stenosis of the major cervical or intracranial arteries. Electronically Signed   By: Ulyses Jarred M.D.   On: 08/11/2019 01:39   Ct Code Stroke Cta Neck W/wo Contrast  Result Date: 08/11/2019 CLINICAL DATA:  Sudden onset headache with unilateral weakness. EXAM: CT ANGIOGRAPHY HEAD AND NECK TECHNIQUE: Multidetector CT imaging of the head and neck was performed using the standard protocol during bolus administration of intravenous contrast. Multiplanar CT image reconstructions and MIPs were obtained to evaluate the vascular anatomy. Carotid stenosis measurements (when applicable) are obtained utilizing NASCET criteria, using the distal internal carotid diameter as the denominator. CONTRAST:  155mL OMNIPAQUE IOHEXOL 350 MG/ML SOLN COMPARISON:  Head  CT 08/10/2019 FINDINGS: CT HEAD FINDINGS Brain: There is no mass, hemorrhage or extra-axial collection. The size and configuration of the ventricles and extra-axial CSF spaces are normal. There is no acute or chronic infarction. There is hypoattenuation of the periventricular white matter, most commonly indicating chronic ischemic microangiopathy. Skull: The visualized skull base, calvarium and extracranial soft tissues are normal. Sinuses/Orbits: No fluid levels or advanced mucosal thickening of the visualized paranasal sinuses. No mastoid or middle ear effusion. The orbits are normal. CTA NECK FINDINGS SKELETON: There is no bony spinal canal stenosis. No lytic or blastic lesion. OTHER NECK: Normal pharynx, larynx and  major salivary glands. No cervical lymphadenopathy. Unremarkable thyroid gland. UPPER CHEST: No pneumothorax or pleural effusion. No nodules or masses. AORTIC ARCH: There is no calcific atherosclerosis of the aortic arch. There is no aneurysm, dissection or hemodynamically significant stenosis of the visualized portion of the aorta. Conventional 3 vessel aortic branching pattern. The visualized proximal subclavian arteries are widely patent. RIGHT CAROTID SYSTEM: Normal without aneurysm, dissection or stenosis. LEFT CAROTID SYSTEM: Normal without aneurysm, dissection or stenosis. VERTEBRAL ARTERIES: Codominant configuration. Both origins are clearly patent. There is no dissection, occlusion or flow-limiting stenosis to the skull base (V1-V3 segments). CTA HEAD FINDINGS POSTERIOR CIRCULATION: --Vertebral arteries: Normal V4 segments. --Posterior inferior cerebellar arteries (PICA): Patent origins from the vertebral arteries. --Anterior inferior cerebellar arteries (AICA): Patent origins from the basilar artery. --Basilar artery: Normal. --Superior cerebellar arteries: Normal. --Posterior cerebral arteries: Normal. Both originate from the basilar artery. Posterior communicating arteries (p-comm) are  diminutive or absent. ANTERIOR CIRCULATION: --Intracranial internal carotid arteries: Normal. --Anterior cerebral arteries (ACA): Normal. Both A1 segments are present. Patent anterior communicating artery (a-comm). --Middle cerebral arteries (MCA): Normal. VENOUS SINUSES: As permitted by contrast timing, patent. ANATOMIC VARIANTS: None Review of the MIP images confirms the above findings. IMPRESSION: 1. No intracranial arterial occlusion or high-grade stenosis. 2. No dissection, aneurysm or hemodynamically significant stenosis of the major cervical or intracranial arteries. Electronically Signed   By: Ulyses Jarred M.D.   On: 08/11/2019 01:39   Ct Head Code Stroke Wo Contrast  Result Date: 08/11/2019 CLINICAL DATA:  Code stroke.  Headache with unilateral weakness. EXAM: CT HEAD WITHOUT CONTRAST TECHNIQUE: Contiguous axial images were obtained from the base of the skull through the vertex without intravenous contrast. COMPARISON:  None. FINDINGS: Brain: There is no mass, hemorrhage or extra-axial collection. The size and configuration of the ventricles and extra-axial CSF spaces are normal. There is hypoattenuation of the periventricular white matter, most commonly indicating chronic ischemic microangiopathy. Vascular: No abnormal hyperdensity of the major intracranial arteries or dural venous sinuses. No intracranial atherosclerosis. Skull: The visualized skull base, calvarium and extracranial soft tissues are normal. Sinuses/Orbits: No fluid levels or advanced mucosal thickening of the visualized paranasal sinuses. No mastoid or middle ear effusion. The orbits are normal. ASPECTS [facility] Stroke Program Early CT Score) - Ganglionic level infarction (caudate, lentiform nuclei, internal capsule, insula, M1-M3 cortex): 7 - Supraganglionic infarction (M4-M6 cortex): 3 Total score (0-10 with 10 being normal): 10 IMPRESSION: 1. Chronic small vessel disease.  No intracranial hemorrhage. 2. ASPECTS is 10. 3. These  results were called by telephone at the time of interpretation on 08/11/2019 at 12:45 am to Dr. Thayer Jew , who verbally acknowledged these results. Electronically Signed   By: Ulyses Jarred M.D.   On: 08/11/2019 00:45    Procedures Procedures (including critical care time)  CRITICAL CARE Performed by: Merryl Hacker   Total critical care time: 50 minutes  Critical care time was exclusive of separately billable procedures and treating other patients.  Critical care was necessary to treat or prevent imminent or life-threatening deterioration.  Critical care was time spent personally by me on the following activities: development of treatment plan with patient and/or surrogate as well as nursing, discussions with consultants, evaluation of patient's response to treatment, examination of patient, obtaining history from patient or surrogate, ordering and performing treatments and interventions, ordering and review of laboratory studies, ordering and review of radiographic studies, pulse oximetry and re-evaluation of patient's condition.   Medications Ordered in ED Medications  prochlorperazine (  COMPAZINE) injection 5 mg (has no administration in time range)  iohexol (OMNIPAQUE) 350 MG/ML injection 100 mL (100 mLs Intravenous Contrast Given 08/11/19 0057)  diphenhydrAMINE (BENADRYL) injection 12.5 mg (12.5 mg Intravenous Given 08/11/19 0201)     Initial Impression / Assessment and Plan / ED Course  I have reviewed the triage vital signs and the nursing notes.  Pertinent labs & imaging results that were available during my care of the patient were reviewed by me and considered in my medical decision making (see chart for details).  Clinical Course as of Aug 10 200  Mon Aug 11, 2019  2706 Patient being evaluated as a code stroke by teleneurology.  CT scan is negative.  Teleneurology requesting CT perfusion study.  Patient's recent creatinine reviewed from [facility] between 1.8 and  2.1.  However, only option if this is an ischemic event is for neuro intervention.  I discussed this with the family as well as the risk of contrast.  They are agreeable and understand the risk and benefits of doing the CT perfusion study.   [CH]  0156 CTA is negative.  Patient with recent surgery and high risk for complications for TPA given extent of neurologic symptoms.  Risk-benefit discussion with the family was had with the neurologist present over the video.  Family opted to forego TPA understanding that she was at higher risk for TPA complications.   [CH]    Clinical Course User Index [CH] Darius Lundberg, Barbette Hair, MD       Patient presents with neuro deficits concerning for stroke.  She also reported a headache.  She is noncontributory to history taking at this time.  Code stroke was initiated.  She was evaluated by teleneurology.  See clinical course above.  Patient had persistent but not worsening deficits while in the emergency room.  There was a long conversation with neurology and the patient's family regarding risk of TPA given recent surgery.  Technically she is out of the 2-week window but she had a fairly significant abdominal surgery and just stopped Lovenox yesterday.  Family was advised she was still likely at high risk for bleeding.  Family opted to not proceed with TPA.  Her neuro deficits have been stable.  She will need an urgent MRI.  Patient was given Compazine and Benadryl to treat a possible complex migraine as well.  Given current bed situation at [facility], will ED to ED transfer for MRI.  I will discussed with the admitting hospitalist regarding admission orders and placement for an inpatient bed.  This was discussed with Dr. Christy Gentles who has accepted the patient.  2:30 AM Spoke with Dr. Hal Hope.  He knows about the patient.  He requests a phone call when she arrives to [facility].  Final Clinical Impressions(s) / ED Diagnoses   Final diagnoses:  Cerebrovascular  accident (CVA) due to other mechanism [facility])    ED Discharge Orders    None       Denzal Meir, Barbette Hair, MD 08/11/19 2376    Merryl Hacker, MD 08/11/19 0230    Merryl Hacker, MD 08/11/19 [phone]

## 2019-08-12 DIAGNOSIS — J9600 Acute respiratory failure, unspecified whether with hypoxia or hypercapnia: Secondary | ICD-10-CM | POA: Diagnosis not present

## 2019-08-12 DIAGNOSIS — I10 Essential (primary) hypertension: Secondary | ICD-10-CM | POA: Diagnosis not present

## 2019-08-12 DIAGNOSIS — I4901 Ventricular fibrillation: Secondary | ICD-10-CM

## 2019-08-12 DIAGNOSIS — N184 Chronic kidney disease, stage 4 (severe): Secondary | ICD-10-CM

## 2019-08-12 DIAGNOSIS — J9601 Acute respiratory failure with hypoxia: Secondary | ICD-10-CM | POA: Diagnosis not present

## 2019-08-12 DIAGNOSIS — G9340 Encephalopathy, unspecified: Secondary | ICD-10-CM | POA: Diagnosis not present

## 2019-08-12 DIAGNOSIS — I420 Dilated cardiomyopathy: Secondary | ICD-10-CM

## 2019-08-12 DIAGNOSIS — R778 Other specified abnormalities of plasma proteins: Secondary | ICD-10-CM

## 2019-08-12 LAB — BASIC METABOLIC PANEL
Anion gap: 10 (ref 5–15)
BUN: 20 mg/dL (ref 8–23)
CO2: 24 mmol/L (ref 22–32)
Calcium: 8.4 mg/dL — ABNORMAL LOW (ref 8.9–10.3)
Chloride: 103 mmol/L (ref 98–111)
Creatinine, Ser: 1.97 mg/dL — ABNORMAL HIGH (ref 0.44–1.00)
GFR calc Af Amer: 30 mL/min — ABNORMAL LOW (ref >60)
GFR calc non Af Amer: 26 mL/min — ABNORMAL LOW (ref >60)
Glucose, Bld: 120 mg/dL — ABNORMAL HIGH (ref 70–99)
Potassium: 3.9 mmol/L (ref 3.5–5.1)
Sodium: 137 mmol/L (ref 135–145)

## 2019-08-12 LAB — CBC WITH DIFFERENTIAL/PLATELET
Abs Immature Granulocytes: 0.12 10*3/uL — ABNORMAL HIGH (ref 0.00–0.07)
Basophils Absolute: 0.1 10*3/uL (ref 0.0–0.1)
Basophils Relative: 1 %
Eosinophils Absolute: 0.1 10*3/uL (ref 0.0–0.5)
Eosinophils Relative: 1 %
HCT: 33.2 % — ABNORMAL LOW (ref 36.0–46.0)
Hemoglobin: 10.1 g/dL — ABNORMAL LOW (ref 12.0–15.0)
Immature Granulocytes: 1 %
Lymphocytes Relative: 13 %
Lymphs Abs: 1.6 10*3/uL (ref 0.7–4.0)
MCH: 29.9 pg (ref 26.0–34.0)
MCHC: 30.4 g/dL (ref 30.0–36.0)
MCV: 98.2 fL (ref 80.0–100.0)
Monocytes Absolute: 1.1 10*3/uL — ABNORMAL HIGH (ref 0.1–1.0)
Monocytes Relative: 9 %
Neutro Abs: 8.9 10*3/uL — ABNORMAL HIGH (ref 1.7–7.7)
Neutrophils Relative %: 75 %
Platelets: 302 10*3/uL (ref 150–400)
RBC: 3.38 MIL/uL — ABNORMAL LOW (ref 3.87–5.11)
RDW: 17.1 % — ABNORMAL HIGH (ref 11.5–15.5)
WBC: 11.9 10*3/uL — ABNORMAL HIGH (ref 4.0–10.5)
nRBC: 0.3 % — ABNORMAL HIGH (ref 0.0–0.2)

## 2019-08-12 LAB — GLUCOSE, CAPILLARY
Glucose-Capillary: 100 mg/dL — ABNORMAL HIGH (ref 70–99)
Glucose-Capillary: 103 mg/dL — ABNORMAL HIGH (ref 70–99)
Glucose-Capillary: 104 mg/dL — ABNORMAL HIGH (ref 70–99)
Glucose-Capillary: 106 mg/dL — ABNORMAL HIGH (ref 70–99)
Glucose-Capillary: 109 mg/dL — ABNORMAL HIGH (ref 70–99)
Glucose-Capillary: 113 mg/dL — ABNORMAL HIGH (ref 70–99)
Glucose-Capillary: 94 mg/dL (ref 70–99)

## 2019-08-12 LAB — URINE CULTURE

## 2019-08-12 LAB — HIV ANTIBODY (ROUTINE TESTING W REFLEX): HIV Screen 4th Generation wRfx: NONREACTIVE

## 2019-08-12 LAB — MAGNESIUM: Magnesium: 2.3 mg/dL (ref 1.7–2.4)

## 2019-08-12 MED ORDER — SODIUM CHLORIDE 0.9 % IV SOLN: INTRAVENOUS | Status: DC

## 2019-08-12 MED ORDER — CARVEDILOL 3.125 MG PO TABS: 3.1250 mg | ORAL_TABLET | Freq: Two times a day (BID) | ORAL | Status: DC

## 2019-08-12 NOTE — Progress Notes (Signed)
NAMESelyna Klahn, MRN:  [id number], DOB:  Oct 17, 1955, LOS: 1 ADMISSION DATE:  08/11/2019, CONSULTATION DATE:  10/26 REFERRING MD:  danford, CHIEF COMPLAINT:  Cardiac arrest    Brief History   63 year old female with complicated medical history as listed below.  Admitted initially on 10/26 with a working diagnosis of rule out acute CVA, plus minus concern for urinary tract infection.  Was premedicated for MRI with lorazepam, subsequently developed respiratory depression and VF arrest.  Following 1 round of CPR and successful defibrillation was intubated for airway protection and pulmonary asked to evaluate   Past Medical History   Multiple sclerosis, right sided weakness, prior right-sided nephrectomy for recurrent renal abscess, hypertension, history of colon cancer Significant Hospital Events   10/26 admitted initially with confusion, right-sided weakness with spastic movement of the right upper extremity; was being worked up for stroke.  While undergoing diagnostic evaluation had received Received lorazepam for MRI.  Developed respiratory depression, followed by agonal respiratory efforts, and then found to be in ventricular fibrillation.  CPR was initiated, was defibrillated x1 successfully, was given Romazicon, intubated for airway protection post arrest 10/27 MRI and EEG neg. Awake. Follows commands. Wbc much improved. Passed SBT so extubated. UC w/ mult orgs. ECHO w/ new CM. Cards asked to see.  Consults:  Neurology 10/26  Procedures:  Intubation 10/26 CPR 10/26   Significant Diagnostic Tests:  UDS negative  CT head/CTA 10/26: No intracranial arterial occlusion or high-grade stenosis.  No dissection, aneurysm or stenosis.  No mass, hemorrhage or acute findings EEG 10/26>>>neg MRI 10/26>>>changes c/w MS ECHO 10/26: EF 20-25 % w/ severely depressed LV function  Micro Data:  Urine culture 10/26: multple species Blood culture x2 10/26>>> COVID-19 10/26>>>neg  Antimicrobials:   Ceftriaxone 10/26>>> Flagyl 10/26>>>  Interim history/subjective:  Passed SBT ready to extubate  Objective   Blood pressure (Abnormal) 107/57, pulse 67, temperature 97.7 F (36.5 C), temperature source Axillary, resp. rate 15, height 5\' 2"  (1.575 m), weight 60.4 kg, SpO2 100 %.    Vent Mode: PSV FiO2 (%):  [40 %-100 %] 40 % Set Rate:  [12 bmp-16 bmp] 12 bmp Vt Set:  [400 mL] 400 mL PEEP:  [5 cmH20] 5 cmH20 Pressure Support:  [10 cmH20] 10 cmH20 Plateau Pressure:  [13 cmH20-15 cmH20] 13 cmH20   Intake/Output Summary (Last 24 hours) at 08/12/2019 0915 Last data filed at 08/12/2019 0600 Gross per 24 hour  Intake 1607.51 ml  Output no documentation  Net 1607.51 ml   Filed Weights   08/11/19 0051 08/11/19 1435  Weight: 59.8 kg 60.4 kg    Examination: General this is a 63 year old white female that looks much older than stated age HENT NCAT MMM, sclera not icteric no JVD Pulm clear excellent VT on F/Vt no accessory use noted  Card RRR w/soft systolic murmur.  Ext warm and dry brisk CR no sig edema  abd not tender + bowel sounds dressing intact. Old surgical site unremarkable  GU cl yellow  Neuro follows commands.   Resolved Hospital Problem list     Assessment & Plan:  Acute metabolic encephalopathy complicated further by brief cardiac arrest and underlying MS -MRI and EEG neg. Favoring metabolic encephalopathy d/t sepsis exacerbating underlying MS.  Plan Cont supportive care OOB PT, OT and SLP consult   Severe deconditioning Plan PT consult  VF arrest now w/new CM EF post-arrest is 20-25%; w/ severe LV dysfxn.  -Seemingly exacerbated by respiratory depression Plan Will cont tele monitoring  Cont asa  Will ask cards to see. Would not add ace-I.   Acute respiratory failure s/p cardiopulmonary arrest  Passed SBT Awake, hemodynamically stable  Plan Extubate SLP eval wean oxygen mobilize   Sepsis. Potential source: urinary tract also consider surgical  site/wound infection.  Urinalysis was concerning for possible infection.  She has a history of recurrent urinary tract infections.  I also am concerned about her wound Plan Day 2 ctx and flagyl F/u cultures Will need CT imaging of abd/pelvis to ensure no post-surgical abscess BUT w/ renal fxn should hold off on contrast currently  AKI superimposed on prior right Nephrectomy (Jul 22 2019) ->scr increased marginally.  Plan Ensure euvolemia  Avoid hypotension Renal dose meds Strict I&O Repeat am chem  Anemia, no evidence of bleeding Plan Trend intermittent cbc    Best practice:  Diet: N.p.o. Pain/Anxiety/Delirium protocol (if indicated): 10/26 VAP protocol (if indicated): 10/26 DVT prophylaxis: LMWH GI prophylaxis: PPI Glucose control: Sliding scale insulin Mobility: None bedrest Code Status: Full code Family Communication: Updated plan of care at bedside Disposition: extubate. Cont abx. Supportive care. SLP eval.     Critical care time:33 minutes      Erick Colace ACNP-BC [place] Pager # [phone] OR # [phone] if no answer

## 2019-08-12 NOTE — Evaluation (Signed)
Speech Language Pathology Evaluation Patient Details Name: Jeanne Bruce MRN: [id number] DOB: October 15, 1956 Today's Date: 08/12/2019 Time: 8032-1224 SLP Time Calculation (min) (ACUTE ONLY): 10 min  Problem List:  Patient Active Problem List   Diagnosis Date Noted  . Acute CVA (cerebrovascular accident) ([place]) 08/11/2019  . Acute lower UTI 08/11/2019  . Essential hypertension 08/11/2019  . Acute encephalopathy 08/11/2019  . CKD (chronic kidney disease) stage 4, GFR 15-29 ml/min (HCC) 08/11/2019  . Multiple sclerosis ([place]) 08/11/2019  . Pressure injury of skin 08/11/2019  . Acute respiratory failure ([place])   . Torsades de pointes ([place])   . Renal insufficiency   . VF (ventricular fibrillation) (HCC)    Past Medical History:  Past Medical History:  Diagnosis Date  . Cancer ([place])    colon (resolved)  . Hypertension   . Multiple sclerosis ([place])   . Renal disorder    Past Surgical History:  Past Surgical History:  Procedure Laterality Date  . NEPHRECTOMY TRANSPLANTED ORGAN     HPI:  64 year old woman with a history of MS, colon cancer with colostomy, recent right nephrectomy for renal abscess (discharged mid October).  Presented with progressive weakness, lethargy and then acute focal neurological signs concerning for possible stroke.  She had a paradoxical reaction to low-dose lorazepam, resulted in respiratory depression and a VF arrest.  She underwent brief CPR, required intubation mechanical ventilation, 10/26, extubated 10/27.  MRI negative.    Assessment / Plan / Recommendation Clinical Impression  Pt participated in cognitive-linguistic assessment, revealing primary delays in initiation, response time, and processing.  There is normal speech fluency and clarity, naming WNL, follows two-step commands.  Demonstrates deficits in short-term verbal memory.  Oriented to person/time, could not recall name of hospital. Pt will benefit from ongoing SLP f/u with further cognitive assessment and  family education.  Pt verbalizes agreement with plan. May benefit from [facility] consult.     SLP Assessment  SLP Recommendation/Assessment: Patient needs continued Speech Lanaguage Pathology Services SLP Visit Diagnosis: Dysphagia, unspecified (R13.10)    Follow Up Recommendations  CIR   Frequency and Duration min 2x/week  2 weeks      SLP Evaluation Cognition  Overall Cognitive Status: Impaired/Different from baseline Arousal/Alertness: Awake/alert Orientation Level: Oriented to person;Oriented to time;Disoriented to place Attention: Focused Memory: Impaired Memory Impairment: Retrieval deficit Problem Solving: Impaired Safety/Judgment: Impaired       Comprehension  Auditory Comprehension Overall Auditory Comprehension: Appears within functional limits for tasks assessed Yes/No Questions: Within Functional Limits Commands: Within Functional Limits EffectiveTechniques: Extra processing time Reading Comprehension Reading Status: Not tested    Expression Verbal Expression Overall Verbal Expression: Appears within functional limits for tasks assessed Initiation: Impaired Level of Generative/Spontaneous Verbalization: Sentence Repetition: No impairment Naming: No impairment Written Expression Dominant Hand: Right Written Expression: Not tested   Oral / Motor  Oral Motor/Sensory Function Overall Oral Motor/Sensory Function: Within functional limits Motor Speech Overall Motor Speech: Appears within functional limits for tasks assessed   GO                    Juan Quam Laurice 08/12/2019, 1:49 PM  Paras Kreider L. Tivis Ringer, [place] Office number [phone]

## 2019-08-12 NOTE — Progress Notes (Signed)
SLP Cancellation Note  Patient Details Name: Andjela Wickes MRN: [id number] DOB: 11/19/1955   Cancelled treatment:       Reason Eval/Treat Not Completed: Patient not medically ready - remains on vent. We will continue to follow.   Venita Sheffield Jerrod Damiano 08/12/2019, 7:18 AM  Pollyann Glen, M.A. [place] Acute Environmental education officer [phone] Office [phone]

## 2019-08-12 NOTE — Evaluation (Signed)
Clinical/Bedside Swallow Evaluation Patient Details  Name: Jeanne Bruce MRN: [id number] Date of Birth: May 10, 1956  Today's Date: 08/12/2019 Time: SLP Start Time (ACUTE ONLY): 1215 SLP Stop Time (ACUTE ONLY): 1225 SLP Time Calculation (min) (ACUTE ONLY): 10 min  Past Medical History:  Past Medical History:  Diagnosis Date  . Cancer ([place])    colon (resolved)  . Hypertension   . Multiple sclerosis ([place])   . Renal disorder    Past Surgical History:  Past Surgical History:  Procedure Laterality Date  . NEPHRECTOMY TRANSPLANTED ORGAN     HPI:  63 year old woman with a history of MS, colon cancer with colostomy, recent right nephrectomy for renal abscess (discharged mid October).  Presented with progressive weakness, lethargy and then acute focal neurological signs concerning for possible stroke.  She had a paradoxical reaction to low-dose lorazepam, resulted in respiratory depression and a VF arrest.  She underwent brief CPR, required intubation mechanical ventilation, 10/26, extubated 10/27.  MRI negative.    Assessment / Plan / Recommendation Clinical Impression  Pt participated in clinical swallow assessment after brief intubation (approx 24 hours). Presents with normal quality voice/slightly low volume; normal cough - suggesting sufficient glottal closure.  Oral mechanism exam is unremarkable. Pt c/o of pain upon swallowing, which is evident from her facial expression each time she swallows, but there are no concerns for aspiration.  She consumed thin water and applesauce with adequate oral attention/manipulation, visibly effortful swallow, no coughing.  Recommend initiating a full liquid diet for now given odynophagia, which should resolve with time post-extubation. Pt will need assistance with self-feeding.  SLP will follow briefly for diet progression/education. D/W RN.  SLP Visit Diagnosis: Dysphagia, unspecified (R13.10)    Aspiration Risk       Diet Recommendation   full  liquids  Medication Administration: Whole meds with puree    Other  Recommendations Oral Care Recommendations: Oral care BID   Follow up Recommendations        Frequency and Duration min 2x/week  1 week       Prognosis        Swallow Study   General Date of Onset: 08/11/19 HPI: 63 year old woman with a history of MS, colon cancer with colostomy, recent right nephrectomy for renal abscess (discharged mid October).  Presented with progressive weakness, lethargy and then acute focal neurological signs concerning for possible stroke.  She had a paradoxical reaction to low-dose lorazepam, resulted in respiratory depression and a VF arrest.  She underwent brief CPR, required intubation mechanical ventilation, 10/26, extubated 10/27.  MRI negative.  Type of Study: Bedside Swallow Evaluation Previous Swallow Assessment: no Diet Prior to this Study: NPO Temperature Spikes Noted: No Respiratory Status: Nasal cannula History of Recent Intubation: Yes Length of Intubations (days): 1 days Date extubated: 08/12/19 Behavior/Cognition: Alert Oral Cavity Assessment: Within Functional Limits Oral Care Completed by SLP: No Oral Cavity - Dentition: Adequate natural dentition Vision: Functional for self-feeding Self-Feeding Abilities: Needs assist Patient Positioning: Upright in chair Baseline Vocal Quality: Normal Volitional Cough: Strong Volitional Swallow: Able to elicit    Oral/Motor/Sensory Function Overall Oral Motor/Sensory Function: Within functional limits   Ice Chips Ice chips: Within functional limits   Thin Liquid Thin Liquid: Within functional limits    Nectar Thick Nectar Thick Liquid: Not tested   Honey Thick Honey Thick Liquid: Not tested   Puree Puree: Within functional limits   Solid     Solid: Not tested      Juan Quam Laurice 08/12/2019,12:43 PM  Kregg Cihlar L. Tivis Ringer, [place] Office number [phone]

## 2019-08-12 NOTE — Procedures (Signed)
Extubation Procedure Note  Patient Details:   Name: Jeanne Bruce DOB: 19-Nov-1955 MRN: [id number]   Airway Documentation:    Vent end date: 08/12/19 Vent end time: 0845   Evaluation  O2 sats: stable throughout Complications: No apparent complications Patient did tolerate procedure well. Bilateral Breath Sounds: Clear   Yes   Patient extubated to [place]. Vital signs stable at this time. RT will continue to monitor.  Mcneil Sober 08/12/2019, 8:57 AM

## 2019-08-12 NOTE — Progress Notes (Signed)
Rehab Admissions Coordinator Note:  Patient was screened by Michel Santee for appropriateness for an Inpatient Acute Rehab Consult.  At this time, we are recommending Inpatient Rehab consult.  Please place a consult order if pt would like to be considered.   Michel Santee 08/12/2019, 1:49 PM  I can be reached at [phone].

## 2019-08-12 NOTE — Evaluation (Signed)
Occupational Therapy Evaluation Patient Details Name: Jeanne Bruce MRN: [id number] DOB: 10-May-1956 Today's Date: 08/12/2019    History of Present Illness 63 year old woman with a history of MS, colon cancer with colostomy, recent right nephrectomy for renal abscess (discharged mid October).  Presented with progressive weakness, lethargy and then acute focal neurological signs concerning for possible stroke.  She had a paradoxical reaction to low-dose lorazepam, resulted in respiratory depression and a VF arrest.  She underwent brief CPR, required intubation mechanical ventilation. MRI brain does not show any evidence of CVA. EEG without any evidence of seizure activity; echocardiogram 10/26 that showed a new drop in EF 20-25% with septal and apical akinesis and evidence for diastolic dysfunction. Severe sepsis with encephalopathy   Clinical Impression   PT admitted with VF arrest. Pt currently with functional limitiations due to the deficits listed below (see OT problem list). Pt currently requires total +2 mod (A) to complete sit<>stand but able to progress to chair Mod (A). Pt at baseline using a RW with stand by assistance. Pt has caregiver present at home if spouse at work. Pt reports R side is much weaker than baseline and currently needs (A) to utilize R hand.  Pt will benefit from skilled OT to increase their independence and safety with adls and balance to allow discharge CIR. Pt could benefit from CIr to benefit burden of care and demonstrate RW use for transfers.      Follow Up Recommendations  CIR    Equipment Recommendations  None recommended by OT    Recommendations for Other Services Rehab consult     Precautions / Restrictions Precautions Precautions: Fall Precaution Comments: ostomy L abdomen/ bandage R side of abdomen      Mobility Bed Mobility Overal bed mobility: Needs Assistance Bed Mobility: Rolling;Supine to Sit Rolling: Mod assist   Supine to sit: +2 for  physical assistance;Mod assist     General bed mobility comments: pt able to initiate moving bil Le to EOB but requires (A) to elevate truck from elevated HOB to come to sitting eob.   Transfers Overall transfer level: Needs assistance Equipment used: 2 person hand held assist Transfers: Sit to/from [facility] Sit to Stand: +2 physical assistance;Mod assist;From elevated surface Stand pivot transfers: Mod assist       General transfer comment: pt requires (A) to weight shift to advance to chair on L side    Balance Overall balance assessment: Mild deficits observed, not formally tested                                         ADL either performed or assessed with clinical judgement   ADL Overall ADL's : Needs assistance/impaired Eating/Feeding: Minimal assistance   Grooming: Moderate assistance   Upper Body Bathing: Moderate assistance   Lower Body Bathing: Maximal assistance   Upper Body Dressing : Moderate assistance   Lower Body Dressing: Maximal assistance   Toilet Transfer: Moderate assistance;Stand-pivot Toilet Transfer Details (indicate cue type and reason): transfer to L with weight shift   Toileting - Clothing Manipulation Details (indicate cue type and reason): pt noted to have large amount of muscus secretions from her vagina this session. pt provided peri care adn then when standing pt with continued secretions. Rn made aware       General ADL Comments: Pt stand pivot this session to chair for pendign SLP evaluation.  Vision         Perception     Praxis      Pertinent Vitals/Pain Pain Assessment: Faces Faces Pain Scale: Hurts a little bit Pain Location: throat Pain Descriptors / Indicators: Grimacing Pain Intervention(s): Monitored during session;Repositioned     Hand Dominance Right   Extremity/Trunk Assessment Upper Extremity Assessment Upper Extremity Assessment: RUE deficits/detail;LUE  deficits/detail RUE Deficits / Details: decrease shoulder flexion but able to initiate against gravity 3 out 5 MMT . pt with gross grasp.    Lower Extremity Assessment Lower Extremity Assessment: Defer to PT evaluation   Cervical / Trunk Assessment Cervical / Trunk Assessment: Normal   Communication Communication Communication: No difficulties   Cognition Arousal/Alertness: Awake/alert Behavior During Therapy: Flat affect Overall Cognitive Status: Impaired/Different from baseline                                 General Comments: slow progressing and delayed responses to questions at times   General Comments  VSS    Exercises     Shoulder Instructions      Home Living Family/patient expects to be discharged to:: Private residence Living Arrangements: Spouse/significant other Available Help at Discharge: Family;Available PRN/intermittently Type of Home: House Home Access: Level entry     Home Layout: One level     Bathroom Shower/Tub: Occupational psychologist: Standard     Home Equipment: Environmental consultant - 2 wheels;Walker - standard;Cane - single point;Bedside commode;Shower seat - built in;Grab bars - toilet;Grab bars - tub/shower   Additional Comments: 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 has been using a wheelchair for week but prior to that was using RW. pt at baseline walks to the bathroom with CNA and CNA completes bath.   Lives With: Spouse    Prior Functioning/Environment Level of Independence: Needs assistance  Gait / Transfers Assistance Needed: walks with RW ADL's / Homemaking Assistance Needed: able to complete self feeding but CNA helps with bathing / dressing.    Comments: baseline R  weaker than L side but has increased this admission        OT Problem List: Decreased strength;Decreased activity tolerance;Impaired balance (sitting and/or standing);Decreased cognition;Decreased safety  awareness;Decreased knowledge of use of DME or AE;Decreased knowledge of precautions;Impaired UE functional use      OT Treatment/Interventions: Self-care/ADL training;Therapeutic exercise;Neuromuscular education;Energy conservation;DME and/or AE instruction;Manual therapy;Modalities;Therapeutic activities;Cognitive remediation/compensation;Patient/family education;Balance training    OT Goals(Current goals can be found in the care plan section) Acute Rehab OT Goals Patient Stated Goal: agreeable to continued therapy OT Goal Formulation: With patient Time For Goal Achievement: 08/26/19 Potential to Achieve Goals: Good  OT Frequency: Min 2X/week   Barriers to D/C:            Co-evaluation PT/OT/SLP Co-Evaluation/Treatment: Yes Reason for Co-Treatment: Complexity of the patient's impairments (multi-system involvement);Necessary to address cognition/behavior during functional activity;For patient/therapist safety;To address functional/ADL transfers   OT goals addressed during session: ADL's and self-care;Proper use of Adaptive equipment and DME;Strengthening/ROM      AM-PAC OT "6 Clicks" Daily Activity     Outcome Measure Help from another person eating meals?: A Lot Help from another person taking care of personal grooming?: A Lot Help from another person toileting, which includes using toliet, bedpan, or urinal?: A Lot Help from another person bathing (including washing, rinsing, drying)?: A Lot  Help from another person to put on and taking off regular upper body clothing?: A Lot Help from another person to put on and taking off regular lower body clothing?: Total 6 Click Score: 11   End of Session Equipment Utilized During Treatment: Gait belt Nurse Communication: Mobility status;Precautions  Activity Tolerance: Patient tolerated treatment well Patient left: in chair;with call bell/phone within reach;with chair alarm set;Other (comment)(SLP amanda present)  OT Visit Diagnosis:  Unsteadiness on feet (R26.81);Muscle weakness (generalized) (M62.81)                Time: 7841-2820 OT Time Calculation (min): 18 min Charges:  OT General Charges $OT Visit: 1 Visit OT Evaluation $OT Eval Moderate Complexity: 1 Mod   Jeri Modena, OTR/L  Acute Rehabilitation Services Pager: [phone] Office: [phone] .   Jeri Modena 08/12/2019, 1:40 PM

## 2019-08-12 NOTE — Evaluation (Signed)
Physical Therapy Evaluation Patient Details Name: Jeanne Bruce MRN: [id number] DOB: 1956-05-14 Today's Date: 08/12/2019   History of Present Illness  63 year old woman with a history of MS, colon cancer with colostomy, recent right nephrectomy for renal abscess (discharged mid October).  Presented with progressive rt-sided weakness, lethargy and then acute focal neurological signs concerning for possible stroke.  She had a paradoxical reaction to low-dose lorazepam, resulted in respiratory depression and a VF arrest.  She underwent brief CPR, required intubation mechanical ventilation. MRI brain does not show any evidence of CVA. EEG without any evidence of seizure activity; echocardiogram 10/26 that showed a new drop in EF 20-25% with septal and apical akinesis and evidence for diastolic dysfunction. Severe sepsis with encephalopathy  Clinical Impression   Pt admitted with above diagnosis. Patient had a significant functional decline just prior to admission due to incr acute weakness. (Husband was pushing her in a wheelchair when previously she walked with RW with supervision). Patient with good home support and potential to return to ambulatory status.  Pt currently with functional limitations due to the deficits listed below (see PT Problem List). Pt will benefit from skilled PT to increase their independence and safety with mobility to allow discharge to the venue listed below.       Follow Up Recommendations CIR;Supervision/Assistance - 24 hour    Equipment Recommendations  None recommended by PT    Recommendations for Other Services Rehab consult     Precautions / Restrictions Precautions Precautions: Fall Precaution Comments: ostomy L abdomen/ bandage R side of abdomen Restrictions Weight Bearing Restrictions: No      Mobility  Bed Mobility Overal bed mobility: Needs Assistance Bed Mobility: Rolling;Supine to Sit Rolling: Mod assist   Supine to sit: +2 for physical  assistance;Mod assist     General bed mobility comments: pt able to initiate moving bil Le to EOB but requires (A) to elevate truck from elevated HOB to come to sitting eob.   Transfers Overall transfer level: Needs assistance Equipment used: 2 person hand held assist Transfers: Sit to/from [facility] Sit to Stand: +2 physical assistance;Mod assist;From elevated surface Stand pivot transfers: Mod assist       General transfer comment: pt requires (A) to weight shift to advance to chair on L side  Ambulation/Gait             General Gait Details: shuffle steps only bed to chair  Stairs            Wheelchair Mobility    Modified Rankin (Stroke Patients Only)       Balance Overall balance assessment: Mild deficits observed, not formally tested                                           Pertinent Vitals/Pain Pain Assessment: Faces Faces Pain Scale: Hurts a little bit Pain Location: throat Pain Descriptors / Indicators: Grimacing Pain Intervention(s): Monitored during session;Repositioned    Home Living Family/patient expects to be discharged to:: Private residence Living Arrangements: Spouse/significant other Available Help at Discharge: Family;Available PRN/intermittently Type of Home: House Home Access: Level entry     Home Layout: One level Home Equipment: Walker - 2 wheels;Walker - standard;Cane - single point;Bedside commode;Shower seat - built in;Grab bars - toilet;Grab bars - tub/shower Additional Comments: 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 has been using a wheelchair for week but prior to that was using RW. pt at baseline walks to the bathroom with CNA and CNA completes bath.     Prior Function Level of Independence: Needs assistance   Gait / Transfers Assistance Needed: walks with RW  ADL's / Homemaking Assistance Needed: able to complete self feeding but CNA  helps with bathing / dressing.   Comments: baseline R  weaker than L side but has increased this admission     Hand Dominance   Dominant Hand: Right    Extremity/Trunk Assessment   Upper Extremity Assessment Upper Extremity Assessment: Defer to OT evaluation RUE Deficits / Details: decrease shoulder flexion but able to initiate against gravity 3 out 5 MMT . pt with gross grasp.     Lower Extremity Assessment Lower Extremity Assessment: RLE deficits/detail;LLE deficits/detail RLE Deficits / Details: hip flexion 2+, knee extension 3+, ankle DF elicited only toe extension LLE Deficits / Details: hip flexion 3, knee extension 4, ankle DF >=3/5    Cervical / Trunk Assessment Cervical / Trunk Assessment: Normal  Communication   Communication: Other (comment)(hypophonia (recently extubated))  Cognition Arousal/Alertness: Awake/alert Behavior During Therapy: Flat affect Overall Cognitive Status: Impaired/Different from baseline                                 General Comments: slow processing and delayed responses to questions at times      General Comments General comments (skin integrity, edema, etc.): VSS    Exercises     Assessment/Plan    PT Assessment Patient needs continued PT services  PT Problem List Decreased strength;Decreased balance;Decreased mobility;Decreased cognition;Decreased knowledge of use of DME;Decreased safety awareness;Decreased knowledge of precautions       PT Treatment Interventions DME instruction;Gait training;Functional mobility training;Therapeutic activities;Therapeutic exercise;Balance training;Neuromuscular re-education;Cognitive remediation;Patient/family education    PT Goals (Current goals can be found in the Care Plan section)  Acute Rehab PT Goals Patient Stated Goal: agreeable to continued therapy and return to walking PT Goal Formulation: With patient Time For Goal Achievement: 08/26/19 Potential to Achieve Goals:  Good    Frequency Min 3X/week   Barriers to discharge        Co-evaluation PT/OT/SLP Co-Evaluation/Treatment: Yes Reason for Co-Treatment: Complexity of the patient's impairments (multi-system involvement);For patient/therapist safety PT goals addressed during session: Mobility/safety with mobility;Balance OT goals addressed during session: ADL's and self-care;Proper use of Adaptive equipment and DME;Strengthening/ROM       AM-PAC PT "6 Clicks" Mobility  Outcome Measure Help needed turning from your back to your side while in a flat bed without using bedrails?: A Lot Help needed moving from lying on your back to sitting on the side of a flat bed without using bedrails?: A Lot Help needed moving to and from a bed to a chair (including a wheelchair)?: A Lot Help needed standing up from a chair using your arms (e.g., wheelchair or bedside chair)?: A Lot Help needed to walk in hospital room?: Total Help needed climbing 3-5 steps with a railing? : Total 6 Click Score: 10    End of Session Equipment Utilized During Treatment: Gait belt Activity Tolerance: Patient tolerated treatment well Patient left: in chair;with call bell/phone within reach;with chair alarm set Nurse Communication: Mobility status PT Visit Diagnosis: Other abnormalities of gait and mobility (R26.89);Muscle weakness (generalized) (M62.81);Other symptoms and signs involving the nervous system (R29.898)  Time: 9937-1696 PT Time Calculation (min) (ACUTE ONLY): 26 min   Charges:   PT Evaluation $PT Eval Moderate Complexity: 1 Mod           Barry Brunner, PT Pager [phone]   Rexanne Mano 08/12/2019, 2:59 PM

## 2019-08-12 NOTE — Consult Note (Signed)
Cardiology Consultation:   Patient ID: Jeanne Bruce MRN: [id number]; DOB: 02/11/1956  Admit date: 08/11/2019 Date of Consult: 08/12/2019  Primary Care Provider: Derrill Center., MD Primary Cardiologist: New to [facility]   Patient Profile:   Jeanne Bruce is a 63 y.o. female with a hx colon cancer, recent right nephrectomy for renal abscess, multiple sclerosis with residual mild right sided weakness who is being seen today for the evaluation of low EF at the request of Dr. Lamonte Sakai.   No prior cardiac hx.   History of Present Illness:   Jeanne Bruce presented yesterday on the morning with complaint of headache, confusion, and right-sided weakness concerning for acute stroke.  Work-up was negative for acute or soft acute CVA.  Neurology felt septic encephalopathy in setting of poor protoplasm due to underlying multiple sclerosis.  Patient has VF cardiac arrest with respiratory depression after low-dose of lorazepam.  Requiring CPR and ventilation support.  EEG without seizure activity.  Echocardiogram showed LV function of 20 to 25% with diffuse hypokinesis, septal and apical akinesis, impaired relaxation of LV diastolic filling. Cardiology is asked for further treatment and evaluation.  Patient denies prior syncope, dizziness, chest pain, shortness of breath, orthopnea, PND or melena.  Heart Pathway Score:     Past Medical History:  Diagnosis Date   Cancer ([place])    colon (resolved)   Hypertension    Multiple sclerosis ([place])    Renal disorder     Past Surgical History:  Procedure Laterality Date   NEPHRECTOMY TRANSPLANTED ORGAN       Inpatient Medications: Scheduled Meds:   stroke: mapping our early stages of recovery book   Does not apply Once   aspirin  300 mg Rectal Daily   Or   aspirin  325 mg Oral Daily   chlorhexidine gluconate (MEDLINE KIT)  15 mL Mouth Rinse BID   Chlorhexidine Gluconate Cloth  6 each Topical Daily   enoxaparin (LOVENOX) injection  30 mg Subcutaneous  Daily   insulin aspart  0-9 Units Subcutaneous Q4H   mouth rinse  15 mL Mouth Rinse 10 times per day   Continuous Infusions:  sodium chloride 10 mL/hr at 08/12/19 1004   cefTRIAXone (ROCEPHIN)  IV 1 g (08/12/19 1014)   metronidazole 500 mg (08/12/19 1304)   PRN Meds:   Allergies:    Allergies  Allergen Reactions   Ativan [Lorazepam] Other (See Comments)    Resp arrest   Invanz [Ertapenem] Anaphylaxis   Mirabegron Other (See Comments)    Caused Hypertension   Claritin-D 12 Hour [Loratadine-Pseudoephedrine Er] Rash   Penicillins Rash    Pt tolerates cephalosporins  Did it involve swelling of the face/tongue/throat, SOB, or low BP? No Did it involve sudden or severe rash/hives, skin peeling, or any reaction on the inside of your mouth or nose? No Did you need to seek medical attention at a hospital or doctor's office? No When did it last happen?A long time ago per daughter  If all above answers are NO, may proceed with cephalosporin use.     Social History:   Social History   Socioeconomic History   Marital status: Married    Spouse name: Not on file   Number of children: Not on file   Years of education: Not on file   Highest education level: Not on file  Occupational History   Not on file  Social Needs   Financial resource strain: Not on file   Food insecurity    Worry: Not  on file    Inability: Not on file   Transportation needs    Medical: Not on file    Non-medical: Not on file  Tobacco Use   Smoking status: Never Smoker   Smokeless tobacco: Never Used  Substance and Sexual Activity   Alcohol use: Not Currently   Drug use: Never   Sexual activity: Not on file  Lifestyle   Physical activity    Days per week: Not on file    Minutes per session: Not on file   Stress: Not on file  Relationships   Social connections    Talks on phone: Not on file    Gets together: Not on file    Attends religious service: Not on file     Active member of club or organization: Not on file    Attends meetings of clubs or organizations: Not on file    Relationship status: Not on file   Intimate partner violence    Fear of current or ex partner: Not on file    Emotionally abused: Not on file    Physically abused: Not on file    Forced sexual activity: Not on file  Other Topics Concern   Not on file  Social History Narrative   Not on file    Family History:   Family History  Problem Relation Age of Onset   Diabetes Mellitus II Neg Hx      ROS:  Please see the history of present illness.  All other ROS reviewed and negative.     Physical Exam/Data:   Vitals:   08/12/19 1000 08/12/19 1100 08/12/19 1200 08/12/19 1600  BP: 125/65 (!) 131/57    Pulse: 76 87 78   Resp: 18 (!) 22 (!) 21   Temp:   99.1 F (37.3 C) 99.1 F (37.3 C)  TempSrc:   Oral Oral  SpO2: 100% 100% 100%   Weight:      Height:        Intake/Output Summary (Last 24 hours) at 08/12/2019 1708 Last data filed at 08/12/2019 0600 Gross per 24 hour  Intake 986.8 ml  Output --  Net 986.8 ml   Last 3 Weights 08/11/2019 08/11/2019  Weight (lbs) 133 lb 2.5 oz 131 lb 12.8 oz  Weight (kg) 60.4 kg 59.784 kg     Body mass index is 24.35 kg/m.  General:  Well nourished, well developed, in no acute distress HEENT: normal Lymph: no adenopathy Neck: no JVD Endocrine:  No thryomegaly Vascular: No carotid bruits; FA pulses 2+ bilaterally without bruits  Cardiac:  normal S1, S2; RRR; no murmur  Lungs:  clear to auscultation bilaterally, no wheezing, rhonchi or rales  Abd: soft, nontender, no hepatomegaly  Ext: no edema Musculoskeletal:  No deformities, BUE and BLE strength normal and equal Skin: warm and dry  Neuro:  CNs 2-12 intact, no focal abnormalities noted Psych:  Normal affect   EKG:  The EKG was personally reviewed and demonstrates: Sinus rhythm at rate of 71 bpm, left bundle branch block Telemetry:  Telemetry was personally reviewed  and demonstrates: Sinus rhythm at controlled ventricular rate with bundle branch block  Relevant CV Studies:  Echo 05/11/2019  1. Left ventricular ejection fraction, by visual estimation, is 20 to 25%. The left ventricle has severely decreased function. Normal left ventricular size. There is no left ventricular hypertrophy.  2. Definity contrast agent was given IV to delineate the left ventricular endocardial borders.  3. Left ventricular diastolic Doppler parameters are  consistent with impaired relaxation pattern of LV diastolic filling.  4. LVEF is severely depressed with diffuse hypokinesis; septal and apical akinesis.  5. Global right ventricle has normal systolic function.The right ventricular size is normal. No increase in right ventricular wall thickness.  6. Left atrial size was normal.  7. Right atrial size was normal.  8. The mitral valve is grossly normal. Trace mitral valve regurgitation.  9. The tricuspid valve is normal in structure. Tricuspid valve regurgitation is trivial. 10. The aortic valve is normal in structure. Aortic valve regurgitation is trivial by color flow Doppler. 11. The pulmonic valve was not well visualized. Pulmonic valve regurgitation is not visualized by color flow Doppler  Laboratory Data:  High Sensitivity Troponin:   Recent Labs  Lab 08/11/19 0656 08/11/19 1540 08/11/19 2046  TROPONINIHS 38* 344* 229*     Chemistry Recent Labs  Lab 08/11/19 0810  08/11/19 1515 08/11/19 2046 08/12/19 0641  NA 136   < > 136 138 137  K 3.4*   < > 3.4* 3.9 3.9  CL 102  --   --  102 103  CO2 22  --   --  23 24  GLUCOSE 209*  --   --  122* 120*  BUN 15  --   --  18 20  CREATININE 1.69*  --   --  1.90* 1.97*  CALCIUM 8.7*  --   --  8.5* 8.4*  GFRNONAA 31*  --   --  27* 26*  GFRAA 36*  --   --  32* 30*  ANIONGAP 12  --   --  13 10   < > = values in this interval not displayed.    Recent Labs  Lab 08/11/19 0045 08/11/19 0810  PROT 6.6 6.0*  ALBUMIN 3.4*  3.0*  AST 45* 48*  ALT 57* 59*  ALKPHOS 101 93  BILITOT 0.8 0.4   Hematology Recent Labs  Lab 08/11/19 0045 08/11/19 0810 08/11/19 0939 08/11/19 1515 08/12/19 0641  WBC 10.5 27.3*  --   --  11.9*  RBC 3.35* 3.21*  --   --  3.38*  HGB 9.9* 9.6* 9.5* 9.9* 10.1*  HCT 32.0* 30.6* 28.0* 29.0* 33.2*  MCV 95.5 95.3  --   --  98.2  MCH 29.6 29.9  --   --  29.9  MCHC 30.9 31.4  --   --  30.4  RDW 16.7* 16.5*  --   --  17.1*  PLT 411* 458*  --   --  302   Radiology/Studies:  Ct Code Stroke Cta Head W/wo Contrast  Result Date: 08/11/2019 CLINICAL DATA:  Sudden onset headache with unilateral weakness. EXAM: CT ANGIOGRAPHY HEAD AND NECK TECHNIQUE: Multidetector CT imaging of the head and neck was performed using the standard protocol during bolus administration of intravenous contrast. Multiplanar CT image reconstructions and MIPs were obtained to evaluate the vascular anatomy. Carotid stenosis measurements (when applicable) are obtained utilizing NASCET criteria, using the distal internal carotid diameter as the denominator. CONTRAST:  160m OMNIPAQUE IOHEXOL 350 MG/ML SOLN COMPARISON:  Head CT 08/10/2019 FINDINGS: CT HEAD FINDINGS Brain: There is no mass, hemorrhage or extra-axial collection. The size and configuration of the ventricles and extra-axial CSF spaces are normal. There is no acute or chronic infarction. There is hypoattenuation of the periventricular white matter, most commonly indicating chronic ischemic microangiopathy. Skull: The visualized skull base, calvarium and extracranial soft tissues are normal. Sinuses/Orbits: No fluid levels or advanced mucosal thickening of the visualized  paranasal sinuses. No mastoid or middle ear effusion. The orbits are normal. CTA NECK FINDINGS SKELETON: There is no bony spinal canal stenosis. No lytic or blastic lesion. OTHER NECK: Normal pharynx, larynx and major salivary glands. No cervical lymphadenopathy. Unremarkable thyroid gland. UPPER CHEST: No  pneumothorax or pleural effusion. No nodules or masses. AORTIC ARCH: There is no calcific atherosclerosis of the aortic arch. There is no aneurysm, dissection or hemodynamically significant stenosis of the visualized portion of the aorta. Conventional 3 vessel aortic branching pattern. The visualized proximal subclavian arteries are widely patent. RIGHT CAROTID SYSTEM: Normal without aneurysm, dissection or stenosis. LEFT CAROTID SYSTEM: Normal without aneurysm, dissection or stenosis. VERTEBRAL ARTERIES: Codominant configuration. Both origins are clearly patent. There is no dissection, occlusion or flow-limiting stenosis to the skull base (V1-V3 segments). CTA HEAD FINDINGS POSTERIOR CIRCULATION: --Vertebral arteries: Normal V4 segments. --Posterior inferior cerebellar arteries (PICA): Patent origins from the vertebral arteries. --Anterior inferior cerebellar arteries (AICA): Patent origins from the basilar artery. --Basilar artery: Normal. --Superior cerebellar arteries: Normal. --Posterior cerebral arteries: Normal. Both originate from the basilar artery. Posterior communicating arteries (p-comm) are diminutive or absent. ANTERIOR CIRCULATION: --Intracranial internal carotid arteries: Normal. --Anterior cerebral arteries (ACA): Normal. Both A1 segments are present. Patent anterior communicating artery (a-comm). --Middle cerebral arteries (MCA): Normal. VENOUS SINUSES: As permitted by contrast timing, patent. ANATOMIC VARIANTS: None Review of the MIP images confirms the above findings. IMPRESSION: 1. No intracranial arterial occlusion or high-grade stenosis. 2. No dissection, aneurysm or hemodynamically significant stenosis of the major cervical or intracranial arteries. Electronically Signed   By: Ulyses Jarred M.D.   On: 08/11/2019 01:39   Ct Code Stroke Cta Neck W/wo Contrast  Result Date: 08/11/2019 CLINICAL DATA:  Sudden onset headache with unilateral weakness. EXAM: CT ANGIOGRAPHY HEAD AND NECK  TECHNIQUE: Multidetector CT imaging of the head and neck was performed using the standard protocol during bolus administration of intravenous contrast. Multiplanar CT image reconstructions and MIPs were obtained to evaluate the vascular anatomy. Carotid stenosis measurements (when applicable) are obtained utilizing NASCET criteria, using the distal internal carotid diameter as the denominator. CONTRAST:  174m OMNIPAQUE IOHEXOL 350 MG/ML SOLN COMPARISON:  Head CT 08/10/2019 FINDINGS: CT HEAD FINDINGS Brain: There is no mass, hemorrhage or extra-axial collection. The size and configuration of the ventricles and extra-axial CSF spaces are normal. There is no acute or chronic infarction. There is hypoattenuation of the periventricular white matter, most commonly indicating chronic ischemic microangiopathy. Skull: The visualized skull base, calvarium and extracranial soft tissues are normal. Sinuses/Orbits: No fluid levels or advanced mucosal thickening of the visualized paranasal sinuses. No mastoid or middle ear effusion. The orbits are normal. CTA NECK FINDINGS SKELETON: There is no bony spinal canal stenosis. No lytic or blastic lesion. OTHER NECK: Normal pharynx, larynx and major salivary glands. No cervical lymphadenopathy. Unremarkable thyroid gland. UPPER CHEST: No pneumothorax or pleural effusion. No nodules or masses. AORTIC ARCH: There is no calcific atherosclerosis of the aortic arch. There is no aneurysm, dissection or hemodynamically significant stenosis of the visualized portion of the aorta. Conventional 3 vessel aortic branching pattern. The visualized proximal subclavian arteries are widely patent. RIGHT CAROTID SYSTEM: Normal without aneurysm, dissection or stenosis. LEFT CAROTID SYSTEM: Normal without aneurysm, dissection or stenosis. VERTEBRAL ARTERIES: Codominant configuration. Both origins are clearly patent. There is no dissection, occlusion or flow-limiting stenosis to the skull base (V1-V3  segments). CTA HEAD FINDINGS POSTERIOR CIRCULATION: --Vertebral arteries: Normal V4 segments. --Posterior inferior cerebellar arteries (PICA): Patent origins from the  vertebral arteries. --Anterior inferior cerebellar arteries (AICA): Patent origins from the basilar artery. --Basilar artery: Normal. --Superior cerebellar arteries: Normal. --Posterior cerebral arteries: Normal. Both originate from the basilar artery. Posterior communicating arteries (p-comm) are diminutive or absent. ANTERIOR CIRCULATION: --Intracranial internal carotid arteries: Normal. --Anterior cerebral arteries (ACA): Normal. Both A1 segments are present. Patent anterior communicating artery (a-comm). --Middle cerebral arteries (MCA): Normal. VENOUS SINUSES: As permitted by contrast timing, patent. ANATOMIC VARIANTS: None Review of the MIP images confirms the above findings. IMPRESSION: 1. No intracranial arterial occlusion or high-grade stenosis. 2. No dissection, aneurysm or hemodynamically significant stenosis of the major cervical or intracranial arteries. Electronically Signed   By: Ulyses Jarred M.D.   On: 08/11/2019 01:39   Mr Brain Wo Contrast  Result Date: 08/11/2019 CLINICAL DATA:  History of multiple sclerosis. Right-sided weakness. Headache. Right facial droop and slurred speech. EXAM: MRI HEAD WITHOUT CONTRAST TECHNIQUE: Multiplanar, multiecho pulse sequences of the brain and surrounding structures were obtained without intravenous contrast. COMPARISON:  CT studies same day.  MRI 03/21/2019 FINDINGS: Brain: Chronic pattern of multiple foci of abnormal T2 and FLAIR signal throughout the cerebral hemispheric deep and subcortical white matter consistent with the clinical diagnosis multiple sclerosis. The vast majority of the foci are stable since June. I do think there is slight increase in prominence of a few of the periventricular lesions, particularly at the level of the posterior body of the lateral ventricles, left more than  right. No restricted diffusion to suggest acute infarction or restricted diffusion plaque. Contrast was not ordered or administered. No cortical/large vessel territory abnormality. No mass lesion, hemorrhage, hydrocephalus or extra-axial collection. Vascular: Major vessels at the base of the brain show flow. Skull and upper cervical spine: Negative Sinuses/Orbits: Mild mucosal inflammatory changes of the maxillary sinuses. Other sinuses clear. Orbits negative. Other: None IMPRESSION: Background pattern of chronic multiple sclerosis. Majority of the white matter lesions are stable. Question slight progression of periventricular white matter disease at the level of the posterior body of the lateral ventricles, probably more so on the left than the right. No lesions show restricted diffusion. I do not see a finding to suggest acute or subacute ischemic infarction. Certainly, in a person of this age, some of these white matter lesions could also relate to chronic small vessel disease. Electronically Signed   By: Nelson Chimes M.D.   On: 08/11/2019 11:59   Dg Chest Portable 1 View  Result Date: 08/11/2019 CLINICAL DATA:  Intubation EXAM: PORTABLE CHEST 1 VIEW COMPARISON:  02/22/2019 FINDINGS: Endotracheal tube with tip 1 cm above the carina. The orogastric tube tip and side-port reaches the stomach. Normal heart size for portable technique. Calcified granuloma over the right lower lobe. There is no edema, consolidation, effusion, or pneumothorax. IMPRESSION: 1. Endotracheal tube with tip only 1 cm above the carina. 2. Orogastric tube in good position. 3. No evidence of acute cardiopulmonary disease. Electronically Signed   By: Monte Fantasia M.D.   On: 08/11/2019 07:39   Ct Head Code Stroke Wo Contrast  Result Date: 08/11/2019 CLINICAL DATA:  Code stroke.  Headache with unilateral weakness. EXAM: CT HEAD WITHOUT CONTRAST TECHNIQUE: Contiguous axial images were obtained from the base of the skull through the  vertex without intravenous contrast. COMPARISON:  None. FINDINGS: Brain: There is no mass, hemorrhage or extra-axial collection. The size and configuration of the ventricles and extra-axial CSF spaces are normal. There is hypoattenuation of the periventricular white matter, most commonly indicating chronic ischemic microangiopathy. Vascular: No  abnormal hyperdensity of the major intracranial arteries or dural venous sinuses. No intracranial atherosclerosis. Skull: The visualized skull base, calvarium and extracranial soft tissues are normal. Sinuses/Orbits: No fluid levels or advanced mucosal thickening of the visualized paranasal sinuses. No mastoid or middle ear effusion. The orbits are normal. ASPECTS [facility] Stroke Program Early CT Score) - Ganglionic level infarction (caudate, lentiform nuclei, internal capsule, insula, M1-M3 cortex): 7 - Supraganglionic infarction (M4-M6 cortex): 3 Total score (0-10 with 10 being normal): 10 IMPRESSION: 1. Chronic small vessel disease.  No intracranial hemorrhage. 2. ASPECTS is 10. 3. These results were called by telephone at the time of interpretation on 08/11/2019 at 12:45 am to Dr. Thayer Jew , who verbally acknowledged these results. Electronically Signed   By: Ulyses Jarred M.D.   On: 08/11/2019 00:45    Assessment and Plan:   1. Acute systolic heart failure -Echocardiogram and EKG along with presentation concerning for Takotsubo cardiomyopathy.  Bundle blanch block on EKG.  No prior EKG to compare. -Patient will need ischemic evaluation.  Not a candidate for coronary angiography or coronary CT given renal function. -Start Coreg 3.125 mg twice daily.  If blood pressure able to tolerate add hydralazine/Imdur. Not a candidate for ACE/ARB.  - Likely stress test   2. Acute metabolic encephalopathy - improved  3. Cardiac arrest - Likely due to respiratory depression after lorazepam - Will need ischemic evaluation as above  4. 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 - follow closely    For questions or updates, please contact [place] Please consult [email or web address] for contact info under     Jarrett Soho, PA  08/12/2019 5:08 PM

## 2019-08-12 NOTE — Progress Notes (Signed)
Subjective: Extubated earlier, doing much better  Exam: Vitals:   08/12/19 1100 08/12/19 1200  BP: (!) 131/57   Pulse: 87 78  Resp: (!) 22 (!) 21  Temp:  99.1 F (37.3 C)  SpO2: 100% 100%   Gen: In bed, NAD Resp: non-labored breathing, no acute distress Abd: soft, nt  Neuro: MS: Awakens easily, follows commands, tells me her name but is very hoarse CN: Pupils equal and reactive, EOMI Motor: Moves all extremities to command Sensory: Intact light touch  Impression: 63 year old female with what I suspect was a septic encephalopathy in the setting of poor protoplasm due to underlying MS.  With negative EEG and MRI, low suspicion for any other ongoing neurological pathology.  At this time, I would favor continuing supportive care and treatment of UTI per internal medicine.  Though focal seizure remains a outside possibility, without a history I would not favor starting antiepileptics.  With the prolonged duration and negative MRI, I think stroke/TIA is very unlikely.  Recommendations: 1) please call if neurology can be of any further assistance  Roland Rack, MD [facility] [phone]  If 7pm- 7am, please page neurology on call as listed in [place].

## 2019-08-13 DIAGNOSIS — G9340 Encephalopathy, unspecified: Secondary | ICD-10-CM | POA: Diagnosis not present

## 2019-08-13 DIAGNOSIS — I4901 Ventricular fibrillation: Secondary | ICD-10-CM | POA: Diagnosis not present

## 2019-08-13 DIAGNOSIS — I420 Dilated cardiomyopathy: Secondary | ICD-10-CM

## 2019-08-13 DIAGNOSIS — N179 Acute kidney failure, unspecified: Secondary | ICD-10-CM | POA: Diagnosis not present

## 2019-08-13 DIAGNOSIS — I10 Essential (primary) hypertension: Secondary | ICD-10-CM | POA: Diagnosis not present

## 2019-08-13 DIAGNOSIS — J9600 Acute respiratory failure, unspecified whether with hypoxia or hypercapnia: Secondary | ICD-10-CM | POA: Diagnosis not present

## 2019-08-13 LAB — COMPREHENSIVE METABOLIC PANEL
ALT: 27 U/L (ref 0–44)
AST: 13 U/L — ABNORMAL LOW (ref 15–41)
Albumin: 2.6 g/dL — ABNORMAL LOW (ref 3.5–5.0)
Alkaline Phosphatase: 86 U/L (ref 38–126)
Anion gap: 12 (ref 5–15)
BUN: 20 mg/dL (ref 8–23)
CO2: 20 mmol/L — ABNORMAL LOW (ref 22–32)
Calcium: 8.3 mg/dL — ABNORMAL LOW (ref 8.9–10.3)
Chloride: 107 mmol/L (ref 98–111)
Creatinine, Ser: 1.76 mg/dL — ABNORMAL HIGH (ref 0.44–1.00)
GFR calc Af Amer: 35 mL/min — ABNORMAL LOW (ref >60)
GFR calc non Af Amer: 30 mL/min — ABNORMAL LOW (ref >60)
Glucose, Bld: 94 mg/dL (ref 70–99)
Potassium: 3.3 mmol/L — ABNORMAL LOW (ref 3.5–5.1)
Sodium: 139 mmol/L (ref 135–145)
Total Bilirubin: 0.3 mg/dL (ref 0.3–1.2)
Total Protein: 5.4 g/dL — ABNORMAL LOW (ref 6.5–8.1)

## 2019-08-13 LAB — CBC
HCT: 29.4 % — ABNORMAL LOW (ref 36.0–46.0)
Hemoglobin: 9 g/dL — ABNORMAL LOW (ref 12.0–15.0)
MCH: 30.1 pg (ref 26.0–34.0)
MCHC: 30.6 g/dL (ref 30.0–36.0)
MCV: 98.3 fL (ref 80.0–100.0)
Platelets: 268 10*3/uL (ref 150–400)
RBC: 2.99 MIL/uL — ABNORMAL LOW (ref 3.87–5.11)
RDW: 17 % — ABNORMAL HIGH (ref 11.5–15.5)
WBC: 9.7 10*3/uL (ref 4.0–10.5)
nRBC: 0.2 % (ref 0.0–0.2)

## 2019-08-13 LAB — GLUCOSE, CAPILLARY
Glucose-Capillary: 94 mg/dL (ref 70–99)
Glucose-Capillary: 88 mg/dL (ref 70–99)
Glucose-Capillary: 113 mg/dL — ABNORMAL HIGH (ref 70–99)
Glucose-Capillary: 135 mg/dL — ABNORMAL HIGH (ref 70–99)
Glucose-Capillary: 124 mg/dL — ABNORMAL HIGH (ref 70–99)

## 2019-08-13 MED ORDER — CEFDINIR 300 MG PO CAPS
300.0000 mg | ORAL_CAPSULE | Freq: Every day | ORAL | Status: AC
  Administered 2019-08-13 – 2019-08-17 (×5): 300 mg via ORAL
  Filled 2019-08-13 (×5): qty 1

## 2019-08-13 MED ORDER — HYDRALAZINE HCL 10 MG PO TABS
10.0000 mg | ORAL_TABLET | Freq: Three times a day (TID) | ORAL | Status: DC
  Administered 2019-08-13 – 2019-08-15 (×6): 10 mg via ORAL
  Filled 2019-08-13 (×7): qty 1

## 2019-08-13 NOTE — Consult Note (Signed)
[facility] Nurse ostomy consult note Reconsulted.  Seen for sacral wound two days ago. Will provide topical orders for RLQ JP drain site.  Sacral wound appears more macerated today-will add alginate to wound care orders.  LLQ colostomy.  Patient prefers supplies from home and spouse will bring.   Stoma type/location: LLQ colostomy  Pink and moist Stomal assessment/size: 1 1/2" pink and moist  Producing brown soft stool Peristomal assessment: not assessed pouch intact Treatment options for stomal/peristomal skin: 1 piece pouch from coloplast.  Will bring from home.  Output soft brown stool Ostomy pouching: 1pc.convex pouch Education provided: none Enrolled patient in [place] program: No Due to periwound maceration, will add alginate to wound care orders for sacrum.   Cleanse RLQ JP drain site with NS and pat dry.  Pack wound depth with Iodoform packing strip. Cover with gauze and tape.  Change daily.  Will not follow at this time.  Please re-consult if needed.  Domenic Moras MSN, RN, FNP-BC [facility]ON Wound, Ostomy, Continence Nurse Pager [phone]

## 2019-08-13 NOTE — Progress Notes (Signed)
Progress Note  Patient Name: Jeanne Bruce Date of Encounter: 08/13/2019  Primary Cardiologist: No primary care provider on file.   Subjective   Denies any chest pain or SOB  Inpatient Medications    Scheduled Meds: .  stroke: mapping our early stages of recovery book   Does not apply Once  . aspirin  300 mg Rectal Daily   Or  . aspirin  325 mg Oral Daily  . chlorhexidine gluconate (MEDLINE KIT)  15 mL Mouth Rinse BID  . Chlorhexidine Gluconate Cloth  6 each Topical Daily  . enoxaparin (LOVENOX) injection  30 mg Subcutaneous Daily  . insulin aspart  0-9 Units Subcutaneous Q4H  . mouth rinse  15 mL Mouth Rinse 10 times per day   Continuous Infusions: . sodium chloride 10 mL/hr at 08/12/19 1004  . cefTRIAXone (ROCEPHIN)  IV Stopped (08/12/19 1044)  . metronidazole 500 mg (08/13/19 0501)   PRN Meds:    Vital Signs    Vitals:   08/13/19 0400 08/13/19 0500 08/13/19 0600 08/13/19 0700  BP: (!) 129/58 130/62 (!) 142/68 139/66  Pulse: 74 77 86 81  Resp: _0 Temp: 98.4 F (36.9 C)     TempSrc: Axillary     SpO2: 96% 96% 98% 99%  Weight:      Height:        Intake/Output Summary (Last 24 hours) at 08/13/2019 0717 Last data filed at 08/13/2019 0501 Gross per 24 hour  Intake 300 ml  Output 400 ml  Net -100 ml   Filed Weights   08/11/19 0051 08/11/19 1435  Weight: 59.8 kg 60.4 kg    Telemetry    NSR - Personally Reviewed  ECG    No new EKG to review - Personally Reviewed  Physical Exam   GEN: No acute distress.   Neck: No JVD Cardiac: RRR, no murmurs, rubs, or gallops.  Respiratory: Clear to auscultation bilaterally. GI: Soft, nontender, non-distended  MS: No edema; No deformity. Neuro:  Nonfocal  Psych: Normal affect   Labs    Chemistry Recent Labs  Lab 08/11/19 0045 08/11/19 0810  08/11/19 2046 08/12/19 0641 08/13/19 0142  NA 137 136   < > 138 137 139  K 3.5 3.4*   < > 3.9 3.9 3.3*  CL 100 102  --  102 103 107  CO2 22 22  --  23  24 20*  GLUCOSE 146* 209*  --  122* 120* 94  BUN 17 15  --  _1 CREATININE 1.49* 1.69*  --  1.90* 1.97* 1.76*  CALCIUM 9.1 8.7*  --  8.5* 8.4* 8.3*  PROT 6.6 6.0*  --   --   --  5.4*  ALBUMIN 3.4* 3.0*  --   --   --  2.6*  AST 45* 48*  --   --   --  13*  ALT 57* 59*  --   --   --  27  ALKPHOS 101 93  --   --   --  86  BILITOT 0.8 0.4  --   --   --  0.3  GFRNONAA 36* 31*  --  27* 26* 30*  GFRAA 42* 36*  --  32* 30* 35*  ANIONGAP 15 12  --  _2 < > = values in this interval not displayed.     Hematology Recent Labs  Lab 08/11/19 0810  08/11/19 1515 08/12/19 0641 08/13/19 0142  WBC  27.3*  --   --  11.9* 9.7  RBC 3.21*  --   --  3.38* 2.99*  HGB 9.6*   < > 9.9* 10.1* 9.0*  HCT 30.6*   < > 29.0* 33.2* 29.4*  MCV 95.3  --   --  98.2 98.3  MCH 29.9  --   --  29.9 30.1  MCHC 31.4  --   --  30.4 30.6  RDW 16.5*  --   --  17.1* 17.0*  PLT 458*  --   --  302 268   < > = values in this interval not displayed.    Cardiac EnzymesNo results for input(s): TROPONINI in the last 168 hours. No results for input(s): TROPIPOC in the last 168 hours.   BNPNo results for input(s): BNP, PROBNP in the last 168 hours.   DDimer No results for input(s): DDIMER in the last 168 hours.   Radiology    Mr Brain Wo Contrast  Result Date: 08/11/2019 CLINICAL DATA:  History of multiple sclerosis. Right-sided weakness. Headache. Right facial droop and slurred speech. EXAM: MRI HEAD WITHOUT CONTRAST TECHNIQUE: Multiplanar, multiecho pulse sequences of the brain and surrounding structures were obtained without intravenous contrast. COMPARISON:  CT studies same day.  MRI 03/21/2019 FINDINGS: Brain: Chronic pattern of multiple foci of abnormal T2 and FLAIR signal throughout the cerebral hemispheric deep and subcortical white matter consistent with the clinical diagnosis multiple sclerosis. The vast majority of the foci are stable since June. I do think there is slight increase in prominence of a  few of the periventricular lesions, particularly at the level of the posterior body of the lateral ventricles, left more than right. No restricted diffusion to suggest acute infarction or restricted diffusion plaque. Contrast was not ordered or administered. No cortical/large vessel territory abnormality. No mass lesion, hemorrhage, hydrocephalus or extra-axial collection. Vascular: Major vessels at the base of the brain show flow. Skull and upper cervical spine: Negative Sinuses/Orbits: Mild mucosal inflammatory changes of the maxillary sinuses. Other sinuses clear. Orbits negative. Other: None IMPRESSION: Background pattern of chronic multiple sclerosis. Majority of the white matter lesions are stable. Question slight progression of periventricular white matter disease at the level of the posterior body of the lateral ventricles, probably more so on the left than the right. No lesions show restricted diffusion. I do not see a finding to suggest acute or subacute ischemic infarction. Certainly, in a person of this age, some of these white matter lesions could also relate to chronic small vessel disease. Electronically Signed   By: Nelson Chimes M.D.   On: 08/11/2019 11:59   Dg Chest Portable 1 View  Result Date: 08/11/2019 CLINICAL DATA:  Intubation EXAM: PORTABLE CHEST 1 VIEW COMPARISON:  02/22/2019 FINDINGS: Endotracheal tube with tip 1 cm above the carina. The orogastric tube tip and side-port reaches the stomach. Normal heart size for portable technique. Calcified granuloma over the right lower lobe. There is no edema, consolidation, effusion, or pneumothorax. IMPRESSION: 1. Endotracheal tube with tip only 1 cm above the carina. 2. Orogastric tube in good position. 3. No evidence of acute cardiopulmonary disease. Electronically Signed   By: Monte Fantasia M.D.   On: 08/11/2019 07:39    Cardiac Studies   2D echo 08/11/2019 IMPRESSIONS    1. Left ventricular ejection fraction, by visual  estimation, is 20 to 25%. The left ventricle has severely decreased function. Normal left ventricular size. There is no left ventricular hypertrophy.  2. Definity contrast agent was given  IV to delineate the left ventricular endocardial borders.  3. Left ventricular diastolic Doppler parameters are consistent with impaired relaxation pattern of LV diastolic filling.  4. LVEF is severely depressed with diffuse hypokinesis; septal and apical akinesis.  5. Global right ventricle has normal systolic function.The right ventricular size is normal. No increase in right ventricular wall thickness.  6. Left atrial size was normal.  7. Right atrial size was normal.  8. The mitral valve is grossly normal. Trace mitral valve regurgitation.  9. The tricuspid valve is normal in structure. Tricuspid valve regurgitation is trivial. 10. The aortic valve is normal in structure. Aortic valve regurgitation is trivial by color flow Doppler. 11. The pulmonic valve was not well visualized. Pulmonic valve regurgitation is not visualized by color flow Doppler.  Patient Profile     63 y.o. female with a hx colon cancer, recent right nephrectomy for renal abscess, multiple sclerosis with residual mild right sided weakness who is being seen today for the evaluation of low EF   Assessment & Plan    1. Acute systolic heart failure -Echocardiogram and EKG along with presentation concerning for Takotsubo cardiomyopathy.  Bundle blanch block on EKG.  No prior EKG to compare. -echo with apical akinesis and ER 20-25% -Patient will need ischemic evaluation.  Not a candidate for coronary angiography or coronary CT given renal function at this time with only 1 remaining kidney -had some heart block on one EKG to avoiding carvedilol at this time -creatinine improved to 1.76 today -will continue to follow creatinine and if it continues to improve then would plan for LHC with cors only cath and limited contrast -avoid ACEI/ARB  -start Hydralazine 33m TID and if BP remains stable add Imdur 145mdaily  2. Acute metabolic encephalopathy - improved  3. Cardiac arrest - Likely due to respiratory depression after lorazepam - Will need ischemic evaluation as above due to LV dysfunction  4. AKi with recent right nephrectomy 07/22/2019 -Scr worsen 1.49>>1.69>>1.9>>1.97>>1.76 -continue to follow and hopefully if improved then plan limited coronary cath      For questions or updates, please contact [facility] consult [email or web address] for contact info under Cardiology/STEMI.      Signed, TrFransico HimMD  08/13/2019, 7:17 AM

## 2019-08-13 NOTE — Progress Notes (Signed)
Physical Therapy Treatment Patient Details Name: Jeanne Bruce MRN: [id number] DOB: 09-28-1956 Today's Date: 08/13/2019    History of Present Illness 63 year old woman with a history of MS, colon cancer with colostomy, recent right nephrectomy for renal abscess (discharged mid October).  Presented with progressive rt-sided weakness, lethargy and then acute focal neurological signs concerning for possible stroke.  She had a paradoxical reaction to low-dose lorazepam, resulted in respiratory depression and a VF arrest.  She underwent brief CPR, required intubation mechanical ventilation. MRI brain does not show any evidence of CVA. EEG without any evidence of seizure activity; echocardiogram 10/26 that showed a new drop in EF 20-25% with septal and apical akinesis and evidence for diastolic dysfunction. Severe sepsis with encephalopathy    PT Comments    Patient more alert this date and able to walk across room with chair closely following. With transfers patient with bladder incontinence and assisted with balance during pericare. She asked husband to bring in some of her Depends.     Follow Up Recommendations  CIR;Supervision/Assistance - 24 hour     Equipment Recommendations  None recommended by PT    Recommendations for Other Services Rehab consult     Precautions / Restrictions Precautions Precautions: Fall Precaution Comments: ostomy L abdomen/ bandage R side of abdomen Restrictions Weight Bearing Restrictions: No    Mobility  Bed Mobility Overal bed mobility: Needs Assistance Bed Mobility: Supine to Sit     Supine to sit: Mod assist;HOB elevated     General bed mobility comments: better initiation on command; difficulty scooting pelvis out to EOB and required assist at pelvis  Transfers Overall transfer level: Needs assistance Equipment used: Rolling walker (2 wheeled) Transfers: Sit to/from [facility] Sit to Stand: Mod assist;From elevated surface          General transfer comment: more alert, improved standing, stepping around to [facility] and walking in her room  Ambulation/Gait Ambulation/Gait assistance: Min assist;+2 safety/equipment(chair follow) Gait Distance (Feet): 15 Feet Assistive device: Rolling walker (2 wheeled) Gait Pattern/deviations: Step-through pattern;Decreased stride length;Shuffle;Decreased dorsiflexion - right;Decreased dorsiflexion - left;Trendelenburg Gait velocity: decr   General Gait Details: assist for manuevering RW through tight spaces in ger room; close follow with chair   Stairs             Wheelchair Mobility    Modified Rankin (Stroke Patients Only)       Balance Overall balance assessment: Mild deficits observed, not formally tested                                          Cognition Arousal/Alertness: Awake/alert Behavior During Therapy: WFL for tasks assessed/performed Overall Cognitive Status: Impaired/Different from baseline Area of Impairment: Problem solving                             Problem Solving: Slow processing;Requires verbal cues;Requires tactile cues General Comments: slow processing and delayed responses to questions at times      Exercises      General Comments General comments (skin integrity, edema, etc.): from bed to [facility] to walking and back to chair      Pertinent Vitals/Pain Pain Assessment: Faces Faces Pain Scale: No hurt    Home Living  Prior Function            PT Goals (current goals can now be found in the care plan section) Acute Rehab PT Goals Patient Stated Goal: agreeable to continued therapy and return to walking PT Goal Formulation: With patient Time For Goal Achievement: 08/26/19 Potential to Achieve Goals: Good Progress towards PT goals: (PT goals set this session)    Frequency    Min 3X/week      PT Plan Current plan remains appropriate    Co-evaluation               AM-PAC PT "6 Clicks" Mobility   Outcome Measure  Help needed turning from your back to your side while in a flat bed without using bedrails?: A Lot Help needed moving from lying on your back to sitting on the side of a flat bed without using bedrails?: A Lot Help needed moving to and from a bed to a chair (including a wheelchair)?: A Lot Help needed standing up from a chair using your arms (e.g., wheelchair or bedside chair)?: A Little Help needed to walk in hospital room?: Total Help needed climbing 3-5 steps with a railing? : Total 6 Click Score: 11    End of Session Equipment Utilized During Treatment: Gait belt Activity Tolerance: Patient tolerated treatment well Patient left: in chair;with call bell/phone within reach;with chair alarm set Nurse Communication: Mobility status PT Visit Diagnosis: Other abnormalities of gait and mobility (R26.89);Muscle weakness (generalized) (M62.81);Other symptoms and signs involving the nervous system (R29.898)     Time: 7282-0601 PT Time Calculation (min) (ACUTE ONLY): 28 min  Charges:  $Gait Training: 23-37 mins                      Barry Brunner, PT Pager [phone]    Rexanne Mano 08/13/2019, 4:30 PM

## 2019-08-13 NOTE — Progress Notes (Signed)
  Speech Language Pathology Treatment: Dysphagia  Patient Details Name: Jeanenne Licea MRN: [id number] DOB: 1956-08-01 Today's Date: 08/13/2019 Time: 1021-1173 SLP Time Calculation (min) (ACUTE ONLY): 16 min  Assessment / Plan / Recommendation Clinical Impression  Pt's diet had been upgraded to regular solids and thin liquids. Pt reports that she does not remember having any odynophagia on previous date, but she denies having any today. Overall her swallow appeared to be functional with solids and liquids, although she had a single cough after a sip of her drink. No other coughing was elicited even with introduction of multiple, sequential straw sips. Would continue current diet. SLP will f/u briefly for tolerance and additional assessment of cognitive abilities, as pt reports having trouble remembering daily events but her processing and initiation also do not appear to be as slow as they may have been on previous date.    HPI HPI: 63 year old woman with a history of MS, colon cancer with colostomy, recent right nephrectomy for renal abscess (discharged mid October).  Presented with progressive weakness, lethargy and then acute focal neurological signs concerning for possible stroke.  She had a paradoxical reaction to low-dose lorazepam, resulted in respiratory depression and a VF arrest.  She underwent brief CPR, required intubation mechanical ventilation, 10/26, extubated 10/27.  MRI negative.       SLP Plan  Continue with current plan of care       Recommendations  Diet recommendations: Regular;Thin liquid Liquids provided via: Cup;Straw Medication Administration: Whole meds with puree Supervision: Patient able to self feed;Intermittent supervision to cue for compensatory strategies Compensations: Slow rate;Small sips/bites Postural Changes and/or Swallow Maneuvers: Seated upright 90 degrees                Oral Care Recommendations: Oral care BID Follow up Recommendations: Other  (comment)(tba - none anticipated for swallowing) SLP Visit Diagnosis: Dysphagia, unspecified (R13.10) Plan: Continue with current plan of care       Welch Parlee Amescua 08/13/2019, 2:05 PM  Pollyann Glen, M.A. [place] Acute Environmental education officer [phone] Office [phone]

## 2019-08-13 NOTE — Progress Notes (Addendum)
NAMEAlauna Bruce, MRN:  [id number], DOB:  11/18/1955, LOS: 2 ADMISSION DATE:  08/11/2019, CONSULTATION DATE:  10/26 REFERRING MD:  danford, CHIEF COMPLAINT:  Cardiac arrest    Brief History   63 year old female with complicated medical history as listed below.  Admitted initially on 10/26 with a working diagnosis of rule out acute CVA, plus minus concern for urinary tract infection.  Was premedicated for MRI with lorazepam, subsequently developed respiratory depression and VF arrest.  Following 1 round of CPR and successful defibrillation was intubated for airway protection and pulmonary asked to evaluate   Past Medical History   Multiple sclerosis, right sided weakness, prior right-sided nephrectomy for recurrent renal abscess, hypertension, history of colon cancer Significant Hospital Events   10/26 admitted initially with confusion, right-sided weakness with spastic movement of the right upper extremity; was being worked up for stroke.  While undergoing diagnostic evaluation had received Received lorazepam for MRI.  Developed respiratory depression, followed by agonal respiratory efforts, and then found to be in ventricular fibrillation.  CPR was initiated, was defibrillated x1 successfully, was given Romazicon, intubated for airway protection post arrest 10/27 MRI and EEG neg. Awake. Follows commands. Wbc much improved. Passed SBT so extubated. UC w/ mult orgs. ECHO w/ new CM. Cards asked to see.  10/28 renal function continues to improve.  Now fully awake oriented.  Hemodynamically stable.  Transferring to medical floor, narrowing antibiotics and changing to oral.  Advancing diet to regular. Consults:  Neurology 10/26  Procedures:  Intubation 10/26 CPR 10/26   Significant Diagnostic Tests:  UDS negative  CT head/CTA 10/26: No intracranial arterial occlusion or high-grade stenosis.  No dissection, aneurysm or stenosis.  No mass, hemorrhage or acute findings EEG 10/26>>>neg MRI  10/26>>>changes c/w MS ECHO 10/26: EF 20-25 % w/ severely depressed LV function  Micro Data:  Urine culture 10/26: multple species Blood culture x2 10/26>>> COVID-19 10/26>>>neg  Antimicrobials:  Ceftriaxone 10/26>>> 10/28 Flagyl 10/26>>> 10/28 Cefdinir 10/28>>>  Interim history/subjective:  No distress no chest pain no abdominal pain feeling better  Objective   Blood pressure 136/67, pulse 78, temperature 98.4 F (36.9 C), temperature source Axillary, resp. rate 18, height 5\' 2"  (1.575 m), weight 60.4 kg, SpO2 97 %.        Intake/Output Summary (Last 24 hours) at 08/13/2019 2458 Last data filed at 08/13/2019 0800 Gross per 24 hour  Intake 500 ml  Output 400 ml  Net 100 ml   Filed Weights   08/11/19 0051 08/11/19 1435  Weight: 59.8 kg 60.4 kg    Examination: General Pleasant 63 year old female patient resting in bed she is in no acute distress today HEENT normocephalic atraumatic no jugular venous distention is appreciated Pulmonary: Clear to auscultation Cardiac: Soft systolic murmur present regular rate and rhythm Abdomen soft nontender wound unremarkable GU clear yellow Neuro intact today  Resolved Hospital Problem list    Acute respiratory failure s/p cardiopulmonary arrest  Acute metabolic encephalopathy, resolved as of 10/28   Assessment & Plan:    VF arrest now w/new CM EF post-arrest is 20-25%; w/ severe LV dysfxn.  -Seemingly exacerbated by respiratory depression Plan Cardiology now following Continue telemetry monitoring Continue aspirin Future diagnostics depending on renal recovery  Sepsis associated acute metabolic encephalopathy. Potential source: urinary tract also consider surgical site/wound infection, I think this less likely urinalysis was concerning for possible infection.  She has a history of recurrent urinary tract infections.   Plan #3 antibiotics, will discontinue Flagyl and change to  oral  She will need to follow-up with her  outpatient urologist in regard to her recurrent infections  Open abdominal wound, surgical site Plan Wound ostomy consulted  AKI superimposed on prior right Nephrectomy (Jul 22 2019) ->scr continues to slowly improve Plan Avoid hypotension Keep euvolemic Renal adjust medications Holding diuretics Strict intake output A.m. chemistry  Fluid and electrolyte imbalance: Hypokalemia Plan Replace potassium  Anemia, no evidence of bleeding Plan Trend CBC  Severe deconditioning, has history of MS Plan Physical therapy consulted   Best practice:  Diet: N.p.o. Pain/Anxiety/Delirium protocol (if indicated): 10/26; stopped 10/27 VAP protocol (if indicated): 10/26; stopped 10/27 DVT prophylaxis: LMWH GI prophylaxis: PPI Glucose control: Sliding scale insulin Mobility: None bedrest; OOB 10/27 Code Status: Full code Family Communication: Updated plan of care at bedside Disposition: transfer to tele, step down abx, cont rehab effort. Trend chem and await cards plan    Critical care time:NA      Erick Colace ACNP-BC [place] Pager # [phone] OR # [phone] if no answer

## 2019-08-14 ENCOUNTER — Encounter (HOSPITAL_COMMUNITY)
Admission: EM | Disposition: A | Discharge status: Home / Self Care | Payer: Self-pay | Source: Home / Self Care | Attending: Emergency Medicine

## 2019-08-14 DIAGNOSIS — I420 Dilated cardiomyopathy: Secondary | ICD-10-CM | POA: Diagnosis not present

## 2019-08-14 DIAGNOSIS — I4901 Ventricular fibrillation: Secondary | ICD-10-CM | POA: Diagnosis not present

## 2019-08-14 DIAGNOSIS — N179 Acute kidney failure, unspecified: Secondary | ICD-10-CM | POA: Diagnosis not present

## 2019-08-14 DIAGNOSIS — I10 Essential (primary) hypertension: Secondary | ICD-10-CM | POA: Diagnosis not present

## 2019-08-14 HISTORY — PX: LEFT HEART CATH AND CORONARY ANGIOGRAPHY: CATH118249

## 2019-08-14 LAB — BASIC METABOLIC PANEL
Anion gap: 12 (ref 5–15)
BUN: 14 mg/dL (ref 8–23)
CO2: 20 mmol/L — ABNORMAL LOW (ref 22–32)
Calcium: 8.5 mg/dL — ABNORMAL LOW (ref 8.9–10.3)
Chloride: 107 mmol/L (ref 98–111)
Creatinine, Ser: 1.5 mg/dL — ABNORMAL HIGH (ref 0.44–1.00)
GFR calc Af Amer: 42 mL/min — ABNORMAL LOW (ref >60)
GFR calc non Af Amer: 36 mL/min — ABNORMAL LOW (ref >60)
Glucose, Bld: 116 mg/dL — ABNORMAL HIGH (ref 70–99)
Potassium: 3.8 mmol/L (ref 3.5–5.1)
Sodium: 139 mmol/L (ref 135–145)

## 2019-08-14 LAB — GLUCOSE, CAPILLARY
Glucose-Capillary: 104 mg/dL — ABNORMAL HIGH (ref 70–99)
Glucose-Capillary: 105 mg/dL — ABNORMAL HIGH (ref 70–99)
Glucose-Capillary: 88 mg/dL (ref 70–99)

## 2019-08-14 SURGERY — LEFT HEART CATH AND CORONARY ANGIOGRAPHY
Anesthesia: LOCAL

## 2019-08-14 MED ORDER — SODIUM CHLORIDE 0.9 % IV SOLN: 250.0000 mL | INTRAVENOUS | Status: DC | PRN

## 2019-08-14 MED ORDER — SODIUM CHLORIDE 0.9% FLUSH: 3.0000 mL | INTRAVENOUS | Status: DC | PRN

## 2019-08-14 MED ORDER — HEPARIN SODIUM (PORCINE) 1000 UNIT/ML IJ SOLN
INTRAMUSCULAR | Status: DC | PRN
  Administered 2019-08-14: 3000 [IU] via INTRAVENOUS

## 2019-08-14 MED ORDER — HEPARIN (PORCINE) IN NACL 1000-0.9 UT/500ML-% IV SOLN
INTRAVENOUS | Status: DC | PRN
  Administered 2019-08-14 (×2): 500 mL

## 2019-08-14 MED ORDER — FENTANYL CITRATE (PF) 100 MCG/2ML IJ SOLN
INTRAMUSCULAR | Status: AC
  Filled 2019-08-14: qty 2

## 2019-08-14 MED ORDER — HYDRALAZINE HCL 20 MG/ML IJ SOLN: 10.0000 mg | INTRAMUSCULAR | Status: AC | PRN

## 2019-08-14 MED ORDER — SODIUM CHLORIDE 0.9 % IV SOLN: INTRAVENOUS | Status: AC

## 2019-08-14 MED ORDER — SODIUM CHLORIDE 0.9 % IV SOLN: INTRAVENOUS | Status: DC

## 2019-08-14 MED ORDER — SODIUM CHLORIDE 0.9 % IV SOLN
INTRAVENOUS | Status: DC
  Administered 2019-08-14 (×2): via INTRAVENOUS

## 2019-08-14 MED ORDER — LIDOCAINE HCL (PF) 1 % IJ SOLN
INTRAMUSCULAR | Status: DC | PRN
  Administered 2019-08-14: 2 mL

## 2019-08-14 MED ORDER — OXYCODONE HCL 5 MG PO TABS
5.0000 mg | ORAL_TABLET | Freq: Four times a day (QID) | ORAL | Status: DC | PRN
  Administered 2019-08-14 – 2019-08-17 (×4): 5 mg via ORAL
  Filled 2019-08-14 (×4): qty 1

## 2019-08-14 MED ORDER — LABETALOL HCL 5 MG/ML IV SOLN: 10.0000 mg | INTRAVENOUS | Status: AC | PRN

## 2019-08-14 MED ORDER — IOHEXOL 350 MG/ML SOLN
INTRAVENOUS | Status: DC | PRN
  Administered 2019-08-14: 40 mL

## 2019-08-14 MED ORDER — SODIUM CHLORIDE 0.9% FLUSH: 3.0000 mL | Freq: Two times a day (BID) | INTRAVENOUS | Status: DC

## 2019-08-14 MED ORDER — FENTANYL CITRATE (PF) 100 MCG/2ML IJ SOLN
INTRAMUSCULAR | Status: DC | PRN
  Administered 2019-08-14 (×2): 25 ug via INTRAVENOUS

## 2019-08-14 MED ORDER — ISOSORBIDE MONONITRATE ER 30 MG PO TB24
15.0000 mg | ORAL_TABLET | Freq: Every day | ORAL | Status: DC
  Administered 2019-08-14 – 2019-08-20 (×7): 15 mg via ORAL
  Filled 2019-08-14 (×7): qty 1

## 2019-08-14 MED ORDER — VERAPAMIL HCL 2.5 MG/ML IV SOLN
INTRAVENOUS | Status: AC
  Filled 2019-08-14: qty 2

## 2019-08-14 MED ORDER — ASPIRIN 81 MG PO CHEW: 81.0000 mg | CHEWABLE_TABLET | ORAL | Status: AC

## 2019-08-14 MED ORDER — MIDAZOLAM HCL 2 MG/2ML IJ SOLN
INTRAMUSCULAR | Status: DC | PRN
  Administered 2019-08-14 (×2): 1 mg via INTRAVENOUS

## 2019-08-14 MED ORDER — MIDAZOLAM HCL 2 MG/2ML IJ SOLN
INTRAMUSCULAR | Status: AC
  Filled 2019-08-14: qty 2

## 2019-08-14 MED ORDER — HEPARIN (PORCINE) IN NACL 1000-0.9 UT/500ML-% IV SOLN
INTRAVENOUS | Status: AC
  Filled 2019-08-14: qty 1000

## 2019-08-14 MED ORDER — ENOXAPARIN SODIUM 30 MG/0.3ML ~~LOC~~ SOLN
30.0000 mg | Freq: Every day | SUBCUTANEOUS | Status: DC
  Administered 2019-08-15 – 2019-08-20 (×6): 30 mg via SUBCUTANEOUS
  Filled 2019-08-14 (×6): qty 0.3

## 2019-08-14 MED ORDER — VERAPAMIL HCL 2.5 MG/ML IV SOLN
INTRAVENOUS | Status: DC | PRN
  Administered 2019-08-14: 10 mL via INTRA_ARTERIAL

## 2019-08-14 MED ORDER — LIDOCAINE HCL (PF) 1 % IJ SOLN
INTRAMUSCULAR | Status: AC
  Filled 2019-08-14: qty 30

## 2019-08-14 MED ORDER — SODIUM CHLORIDE 0.9% FLUSH
3.0000 mL | Freq: Two times a day (BID) | INTRAVENOUS | Status: DC
  Administered 2019-08-14 – 2019-08-19 (×8): 3 mL via INTRAVENOUS

## 2019-08-14 SURGICAL SUPPLY — 9 items

## 2019-08-14 NOTE — H&P (View-Only) (Signed)
Progress Note  Patient Name: Jeanne Bruce Date of Encounter: 08/14/2019  Primary Cardiologist: Fransico Him, MD   Subjective   No chest pain or SOB  Inpatient Medications    Scheduled Meds:  aspirin  325 mg Oral Daily   cefdinir  300 mg Oral Daily   Chlorhexidine Gluconate Cloth  6 each Topical Daily   enoxaparin (LOVENOX) injection  30 mg Subcutaneous Daily   hydrALAZINE  10 mg Oral Q8H   Continuous Infusions:  PRN Meds:    Vital Signs    Vitals:   08/14/19 0500 08/14/19 0600 08/14/19 0700 08/14/19 0729  BP: (!) 141/68 (!) 145/71 (!) 143/77 (!) 143/77  Pulse: 85 76 83   Resp: 17 (!) 23 18   Temp:      TempSrc:      SpO2: 96% 93% 97%   Weight:      Height:        Intake/Output Summary (Last 24 hours) at 08/14/2019 0837 Last data filed at 08/14/2019 0700 Gross per 24 hour  Intake 960 ml  Output 700 ml  Net 260 ml   Filed Weights   08/11/19 0051 08/11/19 1435  Weight: 59.8 kg 60.4 kg    Telemetry    Generally ST, PVCs, multifocal at times, no pairs or longer runs - Personally Reviewed  ECG    No new EKG to review - Personally Reviewed  Physical Exam   General: Well developed, well nourished, female in no acute distress Head: Eyes PERRLA, Head normocephalic and atraumatic Lungs: clear bilaterally to auscultation. Heart: HRRR S1 S2, without rub or gallop. No murmur. 4/4 extremity pulses are 2+ & equal. No JVD. Abdomen: Bowel sounds are present, abdomen soft and non-tender without masses or  hernias noted. Msk: Normal strength and tone for age. Extremities: No clubbing, cyanosis or edema.    Skin:  No rashes or lesions noted. Neuro: Alert and oriented X 3. Psych:  Good affect, responds appropriately  Labs    Chemistry Recent Labs  Lab 08/11/19 0045 08/11/19 0810  08/11/19 2046 08/12/19 0641 08/13/19 0142  NA 137 136   < > 138 137 139  K 3.5 3.4*   < > 3.9 3.9 3.3*  CL 100 102  --  102 103 107  CO2 22 22  --  23 24 20*  GLUCOSE  146* 209*  --  122* 120* 94  BUN 17 15  --  18 20 20   CREATININE 1.49* 1.69*  --  1.90* 1.97* 1.76*  CALCIUM 9.1 8.7*  --  8.5* 8.4* 8.3*  PROT 6.6 6.0*  --   --   --  5.4*  ALBUMIN 3.4* 3.0*  --   --   --  2.6*  AST 45* 48*  --   --   --  13*  ALT 57* 59*  --   --   --  27  ALKPHOS 101 93  --   --   --  86  BILITOT 0.8 0.4  --   --   --  0.3  GFRNONAA 36* 31*  --  27* 26* 30*  GFRAA 42* 36*  --  32* 30* 35*  ANIONGAP 15 12  --  13 10 12    < > = values in this interval not displayed.     Hematology Recent Labs  Lab 08/11/19 0810  08/11/19 1515 08/12/19 0641 08/13/19 0142  WBC 27.3*  --   --  11.9* 9.7  RBC 3.21*  --   --  3.38* 2.99*  HGB 9.6*   < > 9.9* 10.1* 9.0*  HCT 30.6*   < > 29.0* 33.2* 29.4*  MCV 95.3  --   --  98.2 98.3  MCH 29.9  --   --  29.9 30.1  MCHC 31.4  --   --  30.4 30.6  RDW 16.5*  --   --  17.1* 17.0*  PLT 458*  --   --  302 268   < > = values in this interval not displayed.    Cardiac Enzymes High Sensitivity Troponin:   Recent Labs  Lab 08/11/19 0656 08/11/19 1540 08/11/19 2046  TROPONINIHS 38* 344* 229*       Radiology    No results found.  Cardiac Studies   2D echo 08/11/2019 IMPRESSIONS    1. Left ventricular ejection fraction, by visual estimation, is 20 to 25%. The left ventricle has severely decreased function. Normal left ventricular size. There is no left ventricular hypertrophy.  2. Definity contrast agent was given IV to delineate the left ventricular endocardial borders.  3. Left ventricular diastolic Doppler parameters are consistent with impaired relaxation pattern of LV diastolic filling.  4. LVEF is severely depressed with diffuse hypokinesis; septal and apical akinesis.  5. Global right ventricle has normal systolic function.The right ventricular size is normal. No increase in right ventricular wall thickness.  6. Left atrial size was normal.  7. Right atrial size was normal.  8. The mitral valve is grossly normal.  Trace mitral valve regurgitation.  9. The tricuspid valve is normal in structure. Tricuspid valve regurgitation is trivial. 10. The aortic valve is normal in structure. Aortic valve regurgitation is trivial by color flow Doppler. 11. The pulmonic valve was not well visualized. Pulmonic valve regurgitation is not visualized by color flow Doppler.  Patient Profile     63 y.o. female with a hx colon cancer, recent right nephrectomy for renal abscess, multiple sclerosis with residual mild right sided weakness who is being seen today for the evaluation of low EF   Assessment & Plan    1. Acute systolic heart failure - ?Takotsubo CM - has no hx CAD, no ischemic eval yet, hoping that renal function will improve enough to cath (decreased renal function w/ only 1 kidney)>>recheck today - EF 20-25% w/ apical AK - Had some heart block>>no BB till now, but HR is elevated and no heart block in > 48 hr, discuss adding low-dose BB w/ MD - no ACE/ARB w/ decreased renal function - hydralazine 10 mg tid added 10/28 - add Imdur 15 mg 10/29  2. Acute metabolic encephalopathy - follow, improved  3. Cardiac arrest - VF arrest in setting of respiratory depression (had lorazepam) -  No sig arrhythmia on tele  4. AKi with recent right nephrectomy 07/22/2019 -Scr worsen 1.49>>1.69>>1.9>>1.97>>1.76>>pending -continue to follow and hopefully if improved then plan limited coronary cath    The risks and benefits of a cardiac catheterization including, but not limited to, death, stroke, MI, kidney damage and bleeding were discussed with the patient who indicates understanding and agrees to proceed.   She has had breakfast today, could to this pm, if renal function good enough. BMET ordered.  For questions or updates, please contact [place] Please consult [email or web address] for contact info under Cardiology/STEMI.      Signed, Rosaria Ferries, PA-C  08/14/2019, 8:37 AM

## 2019-08-14 NOTE — Progress Notes (Signed)
eLink Physician-Brief Progress Note Patient Name: Jeanne Bruce DOB: [birth year] MRN: [id number]   Date of Service  08/14/2019  HPI/Events of Note  Notified of patient's complain of chest wall pain.  Pt takes oxycodone at home.   Pt is awake and alert.   eICU Interventions  Oxycodone reordered.     Intervention Category Intermediate Interventions: Pain - evaluation and management  Elsie Lincoln 08/14/2019, 1:29 AM

## 2019-08-14 NOTE — Progress Notes (Signed)
PROGRESS NOTE    Jeanne Bruce  WUJ:[id number] DOB: Dec 13, 1955 DOA: 08/11/2019 PCP: Derrill Center., MD   Brief Narrative:  63 year old woman with MS, history of colon cancer with a colostomy, recent right nephrectomy for renal abscess (October).  She is admitted with progressive weakness and then acute focal neurological signs concerning for acute CVA.  She then suffered a brief respiratory and VF arrest in the ED after receiving low-dose lorazepam as evaluation was underway.  She was intubated, resuscitated with 1 round of CPR.  Hypothermia protocol was deferred. Her MRI, EEG were reassuring.  She has been treated with empiric antibiotics with a working diagnosis of sepsis as a precipitant of acute encephalopathy.  Urine culture showed multiple species.    Assessment & Plan:   Principal Problem:   Acute CVA (cerebrovascular accident) ([place]) Active Problems:   Acute lower UTI   Essential hypertension   Acute encephalopathy   CKD (chronic kidney disease) stage 4, GFR 15-29 ml/min (HCC)   Multiple sclerosis (HCC)   Acute respiratory failure (HCC)   Renal insufficiency   VF (ventricular fibrillation) (HCC)   Pressure injury of skin   DCM (dilated cardiomyopathy) ([place])   AKI (acute kidney injury) ([place])   Acute encephalopathy, presumed due to sepsis, currently improved.  No evidence of CVA or residual neurological deficit.  Suspect urinary tract source versus wound infection. -Switched to cefdinir today -Wound ostomy consultation recommendations -Continue to follow mental status, improved  VF arrest presumed due to respiratory suppression following lorazepam. Evidence for severe LV dysfunction? stress reaction versus CAD versus other. -Appreciate cardiology evaluation.  Plan for cardiac catheterization today to assess for CAD depending on her renal recovery.  Acute on chronic renal insufficiency -Follow BMP, urine output with restoration renal perfusion -Avoid nephrotoxins -Holding any  diuretics for now  UTI: -Urine culture showed multiple species.  However, she did have some leukocytosis along with encephalopathy.  Currently on cefdinir.   DVT prophylaxis: Subcutaneous Lovenox Code Status: Full code Family Communication: No family at bedside  Disposition Plan: Home when stable  Consultants:   Cardiology  Procedures:   Cardiac cath today?  Antimicrobials:   Ceftriaxone, Flagyl (10/26-10/27), cefdinir 08/13/2019   Subjective: Complaining of chest pain.  Denies having any shortness of breath or nausea.  Objective: Vitals:   08/14/19 0729 08/14/19 0835 08/14/19 0900 08/14/19 1000  BP: (!) 143/77 (!) 145/89 (!) 160/76 (!) 152/72  Pulse:  81 88 80  Resp:  20 18 18   Temp:      TempSrc:      SpO2:  98% 95% 96%  Weight:      Height:        Intake/Output Summary (Last 24 hours) at 08/14/2019 1054 Last data filed at 08/14/2019 0800 Gross per 24 hour  Intake 1060 ml  Output 700 ml  Net 360 ml   Filed Weights   08/11/19 0051 08/11/19 1435  Weight: 59.8 kg 60.4 kg    Examination:  General exam: Appears calm and comfortable  Respiratory system: Clear to auscultation. Respiratory effort normal. Cardiovascular system: S1 & S2 heard, RRR.  Murmur, no pedal edema. Gastrointestinal system: Abdomen is nondistended, nontender.  Has ostomy, normal bowel sounds heard.  Right lower quadrant JP drain Central nervous system: Alert and oriented. No focal neurological deficits. Extremities: Symmetric 5 x 5 power. Skin: No rashes, lesions or ulcers Psychiatry: Judgement and insight appear normal. Mood & affect appropriate.     Data Reviewed: I have personally reviewed following labs  and imaging studies  CBC: Recent Labs  Lab 08/11/19 0045 08/11/19 0810 08/11/19 0939 08/11/19 1515 08/12/19 0641 08/13/19 0142  WBC 10.5 27.3*  --   --  11.9* 9.7  NEUTROABS 6.3  --   --   --  8.9*  --   HGB 9.9* 9.6* 9.5* 9.9* 10.1* 9.0*  HCT 32.0* 30.6* 28.0* 29.0*  33.2* 29.4*  MCV 95.5 95.3  --   --  98.2 98.3  PLT 411* 458*  --   --  302 409   Basic Metabolic Panel: Recent Labs  Lab 08/11/19 0656 08/11/19 0810  08/11/19 1515 08/11/19 2046 08/12/19 0641 08/13/19 0142 08/14/19 0950  NA  --  136   < > 136 138 137 139 139  K  --  3.4*   < > 3.4* 3.9 3.9 3.3* 3.8  CL  --  102  --   --  102 103 107 107  CO2  --  22  --   --  23 24 20* 20*  GLUCOSE  --  209*  --   --  122* 120* 94 116*  BUN  --  15  --   --  18 20 20 14   CREATININE  --  1.69*  --   --  1.90* 1.97* 1.76* 1.50*  CALCIUM  --  8.7*  --   --  8.5* 8.4* 8.3* 8.5*  MG 2.6*  --   --   --  2.4 2.3  --   --    < > = values in this interval not displayed.   GFR: Estimated Creatinine Clearance: 32 mL/min (A) (by C-G formula based on SCr of 1.5 mg/dL (H)). Liver Function Tests: Recent Labs  Lab 08/11/19 0045 08/11/19 0810 08/13/19 0142  AST 45* 48* 13*  ALT 57* 59* 27  ALKPHOS 101 93 86  BILITOT 0.8 0.4 0.3  PROT 6.6 6.0* 5.4*  ALBUMIN 3.4* 3.0* 2.6*   No results for input(s): LIPASE, AMYLASE in the last 168 hours. No results for input(s): AMMONIA in the last 168 hours. Coagulation Profile: Recent Labs  Lab 08/11/19 0045  INR 1.1   Cardiac Enzymes: No results for input(s): CKTOTAL, CKMB, CKMBINDEX, TROPONINI in the last 168 hours. BNP (last 3 results) No results for input(s): PROBNP in the last 8760 hours. HbA1C: Recent Labs    08/11/19 1540  HGBA1C 4.6*   CBG: Recent Labs  Lab 08/13/19 1124 08/13/19 1601 08/13/19 1956 08/14/19 0005 08/14/19 0346  GLUCAP 113* 135* 124* 105* 104*   Lipid Profile: No results for input(s): CHOL, HDL, LDLCALC, TRIG, CHOLHDL, LDLDIRECT in the last 72 hours. Thyroid Function Tests: No results for input(s): TSH, T4TOTAL, FREET4, T3FREE, THYROIDAB in the last 72 hours. Anemia Panel: No results for input(s): VITAMINB12, FOLATE, FERRITIN, TIBC, IRON, RETICCTPCT in the last 72 hours. Sepsis Labs: No results for input(s):  PROCALCITON, LATICACIDVEN in the last 168 hours.  Recent Results (from the past 240 hour(s))  Urine culture     Status: Abnormal   Collection Time: 08/11/19  1:13 AM   Specimen: Urine, Catheterized  Result Value Ref Range Status   Specimen Description   Final    URINE, CATHETERIZED Performed at [facility], [address] [place], [place] [address]    Special Requests   Final    NONE Performed at [facility], [address] [place], [place] [address]    Culture MULTIPLE SPECIES PRESENT, SUGGEST RECOLLECTION (A)  Final  Report Status 08/12/2019 FINAL  Final  SARS CORONAVIRUS 2 (TAT 6-24 HRS) Nasopharyngeal Nasopharyngeal Swab     Status: None   Collection Time: 08/11/19  2:08 AM   Specimen: Nasopharyngeal Swab  Result Value Ref Range Status   SARS Coronavirus 2 NEGATIVE NEGATIVE Final    Comment: (NOTE) SARS-CoV-2 target nucleic acids are NOT DETECTED. The SARS-CoV-2 RNA is generally detectable in upper and lower respiratory specimens during the acute phase of infection. Negative results do not preclude SARS-CoV-2 infection, do not rule out co-infections with other pathogens, and should not be used as the sole basis for treatment or other patient management decisions. Negative results must be combined with clinical observations, patient history, and epidemiological information. The expected result is Negative. Fact Sheet for Patients: SugarRoll.be Fact Sheet for Healthcare Providers: https://www.woods-mathews.com/ This test is not yet approved or cleared by the [place] FDA and  has been authorized for detection and/or diagnosis of SARS-CoV-2 by FDA under an Emergency Use Authorization (EUA). This EUA will remain  in effect (meaning this test can be used) for the duration of the COVID-19 declaration under Section 56 4(b)(1) of the Act, 21 U.S.C. section 360bbb-3(b)(1), unless the authorization is  terminated or revoked sooner. Performed at [facility], [place] [address], [place], [place] [address]   Culture, blood (Routine X 2) w Reflex to ID Panel     Status: None (Preliminary result)   Collection Time: 08/11/19  8:36 AM   Specimen: BLOOD LEFT HAND  Result Value Ref Range Status   Specimen Description BLOOD LEFT HAND  Final   Special Requests   Final    BOTTLES DRAWN AEROBIC ONLY Blood Culture results may not be optimal due to an inadequate volume of blood received in culture bottles   Culture   Final    NO GROWTH 3 DAYS Performed at [facility], [place] [address] [place], [place] [address]    Report Status PENDING  Incomplete  Culture, blood (Routine X 2) w Reflex to ID Panel     Status: None (Preliminary result)   Collection Time: 08/11/19  9:30 AM   Specimen: BLOOD LEFT HAND  Result Value Ref Range Status   Specimen Description BLOOD LEFT HAND  Final   Special Requests   Final    BOTTLES DRAWN AEROBIC ONLY Blood Culture results may not be optimal due to an inadequate volume of blood received in culture bottles   Culture   Final    NO GROWTH 3 DAYS Performed at [facility], [place] [address] [place], [place] [address]    Report Status PENDING  Incomplete  SARS Coronavirus 2 by RT PCR (hospital order, performed in [facility] hospital lab) Nasopharyngeal Nasopharyngeal Swab     Status: None   Collection Time: 08/11/19 10:23 AM   Specimen: Nasopharyngeal Swab  Result Value Ref Range Status   SARS Coronavirus 2 NEGATIVE NEGATIVE Final    Comment: (NOTE) If result is NEGATIVE SARS-CoV-2 target nucleic acids are NOT DETECTED. The SARS-CoV-2 RNA is generally detectable in upper and lower  respiratory specimens during the acute phase of infection. The lowest  concentration of SARS-CoV-2 viral copies this assay can detect is 250  copies / mL. A negative result does not preclude SARS-CoV-2 infection  and should not be used as the sole basis for treatment or  other  patient management decisions.  A negative result may occur with  improper specimen collection / handling, submission of specimen other  than nasopharyngeal swab, presence of viral mutation(s) within the  areas targeted by this assay, and inadequate number of viral copies  (<250 copies / mL). A negative result must be combined with clinical  observations, patient history, and epidemiological information. If result is POSITIVE SARS-CoV-2 target nucleic acids are DETECTED. The SARS-CoV-2 RNA is generally detectable in upper and lower  respiratory specimens dur ing the acute phase of infection.  Positive  results are indicative of active infection with SARS-CoV-2.  Clinical  correlation with patient history and other diagnostic information is  necessary to determine patient infection status.  Positive results do  not rule out bacterial infection or co-infection with other viruses. If result is PRESUMPTIVE POSTIVE SARS-CoV-2 nucleic acids MAY BE PRESENT.   A presumptive positive result was obtained on the submitted specimen  and confirmed on repeat testing.  While 2019 novel coronavirus  (SARS-CoV-2) nucleic acids may be present in the submitted sample  additional confirmatory testing may be necessary for epidemiological  and / or clinical management purposes  to differentiate between  SARS-CoV-2 and other Sarbecovirus currently known to infect humans.  If clinically indicated additional testing with an alternate test  methodology [phone]) is advised. The SARS-CoV-2 RNA is generally  detectable in upper and lower respiratory sp ecimens during the acute  phase of infection. The expected result is Negative. Fact Sheet for Patients:  StrictlyIdeas.no Fact Sheet for Healthcare Providers: BankingDealers.co.za This test is not yet approved or cleared by the [place] FDA and has been authorized for detection and/or diagnosis of  SARS-CoV-2 by FDA under an Emergency Use Authorization (EUA).  This EUA will remain in effect (meaning this test can be used) for the duration of the COVID-19 declaration under Section 564(b)(1) of the Act, 21 U.S.C. section 360bbb-3(b)(1), unless the authorization is terminated or revoked sooner. Performed at [facility], [place] [address], [place], [place] [address]   MRSA PCR Screening     Status: None   Collection Time: 08/11/19  3:01 PM   Specimen: Nasal Mucosa; Nasopharyngeal  Result Value Ref Range Status   MRSA by PCR NEGATIVE NEGATIVE Final    Comment:        The GeneXpert MRSA Assay (FDA approved for NASAL specimens only), is one component of a comprehensive MRSA colonization surveillance program. It is not intended to diagnose MRSA infection nor to guide or monitor treatment for MRSA infections. Performed at [facility], [place] [address] [place], [place] [address]          Radiology Studies: No results found.      Scheduled Meds: . aspirin  325 mg Oral Daily  . cefdinir  300 mg Oral Daily  . Chlorhexidine Gluconate Cloth  6 each Topical Daily  . enoxaparin (LOVENOX) injection  30 mg Subcutaneous Daily  . hydrALAZINE  10 mg Oral Q8H  . isosorbide mononitrate  15 mg Oral Daily   Continuous Infusions:   LOS: 3 days    Yaakov Guthrie, MD [facility] Pager on amion  If 7PM-7AM, please contact night-coverage [email or web address] Password [email or web address] 08/14/2019, 10:54 AM

## 2019-08-14 NOTE — Progress Notes (Signed)
  Speech Language Pathology Treatment: Dysphagia;Cognitive-Linquistic  Patient Details Name: Jeanne Bruce MRN: [id number] DOB: [birth year] Today's Date: 08/14/2019 Time: 0165-5374 SLP Time Calculation (min) (ACUTE ONLY): 23 min  Assessment / Plan / Recommendation Clinical Impression  Pt was seen for treatment targeting dysphagia and cognitive-linguistic deficits.  RN and pt both reported that the pt was tolerating regular solids and thin liquids without difficulty.  Pt was observed with trials of regular solids and thin liquid from her breakfast meal tray and she did not exhibit clinical s/sx of aspiration or difficulty with any trials.  SLP educated pt regarding safe swallow strategies (listed below) and the pt verbalized understanding with teach back.  No further skilled ST is warranted targeting dysphagia.   Pt additionally was educated regarding short-term memory strategies (write, repeat, associate, picture) and she completed a functional 4 word recall task using the repetition and association strategies.  Pt generated a 4 item grocery list and she recalled 4/4 items immediately and 3/4 items following a 5 minute delay, improving to 4/4 given 1 semantic cue. Recommend continuation of skilled ST targeting cognitive-linguistic deficits.  SLP will continue to f/u per POC.    HPI HPI: 63 year old woman with a history of MS, colon cancer with colostomy, recent right nephrectomy for renal abscess (discharged mid October).  Presented with progressive weakness, lethargy and then acute focal neurological signs concerning for possible stroke.  She had a paradoxical reaction to low-dose lorazepam, resulted in respiratory depression and a VF arrest.  She underwent brief CPR, required intubation mechanical ventilation, 10/26, extubated 10/27.  MRI negative.       SLP Plan  Goals updated       Recommendations  Diet recommendations: Regular;Thin liquid Liquids provided via: Cup;Straw Medication  Administration: Whole meds with puree Supervision: Patient able to self feed;Intermittent supervision to cue for compensatory strategies Compensations: Slow rate;Small sips/bites Postural Changes and/or Swallow Maneuvers: Seated upright 90 degrees                SLP Visit Diagnosis: Dysphagia, unspecified (R13.10);Cognitive communication deficit (M27.078) Plan: Goals updated       Bretta Bang, M.S., [facility] Office: [phone]               [place] 08/14/2019, 10:05 AM

## 2019-08-14 NOTE — Interval H&P Note (Signed)
History and Physical Interval Note:  08/14/2019 3:57 PM  Jeanne Bruce  has presented today for cardiac cath with the diagnosis of cardiomyopathy/v fib arrest.  The various methods of treatment have been discussed with the patient and family. After consideration of risks, benefits and other options for treatment, the patient has consented to  Procedure(s): LEFT HEART CATH AND CORONARY ANGIOGRAPHY (N/A) as a surgical intervention.  The patient's history has been reviewed, patient examined, no change in status, stable for surgery.  I have reviewed the patient's chart and labs.  Questions were answered to the patient's satisfaction.    Cath Lab Visit (complete for each Cath Lab visit)  Clinical Evaluation Leading to the Procedure:   ACS: No.  Non-ACS:    Anginal Classification: CCS I  Anti-ischemic medical therapy: No Therapy  Non-Invasive Test Results: No non-invasive testing performed  Prior CABG: No previous CABG         Lauree Chandler

## 2019-08-14 NOTE — Progress Notes (Addendum)
Progress Note  Patient Name: Jeanne Bruce Date of Encounter: 08/14/2019  Primary Cardiologist: Fransico Him, MD   Subjective   No chest pain or SOB  Inpatient Medications    Scheduled Meds:  aspirin  325 mg Oral Daily   cefdinir  300 mg Oral Daily   Chlorhexidine Gluconate Cloth  6 each Topical Daily   enoxaparin (LOVENOX) injection  30 mg Subcutaneous Daily   hydrALAZINE  10 mg Oral Q8H   Continuous Infusions:  PRN Meds:    Vital Signs    Vitals:   08/14/19 0500 08/14/19 0600 08/14/19 0700 08/14/19 0729  BP: (!) 141/68 (!) 145/71 (!) 143/77 (!) 143/77  Pulse: 85 76 83   Resp: 17 (!) 23 18   Temp:      TempSrc:      SpO2: 96% 93% 97%   Weight:      Height:        Intake/Output Summary (Last 24 hours) at 08/14/2019 0837 Last data filed at 08/14/2019 0700 Gross per 24 hour  Intake 960 ml  Output 700 ml  Net 260 ml   Filed Weights   08/11/19 0051 08/11/19 1435  Weight: 59.8 kg 60.4 kg    Telemetry    Generally ST, PVCs, multifocal at times, no pairs or longer runs - Personally Reviewed  ECG    No new EKG to review - Personally Reviewed  Physical Exam   General: Well developed, well nourished, female in no acute distress Head: Eyes PERRLA, Head normocephalic and atraumatic Lungs: clear bilaterally to auscultation. Heart: HRRR S1 S2, without rub or gallop. No murmur. 4/4 extremity pulses are 2+ & equal. No JVD. Abdomen: Bowel sounds are present, abdomen soft and non-tender without masses or  hernias noted. Msk: Normal strength and tone for age. Extremities: No clubbing, cyanosis or edema.    Skin:  No rashes or lesions noted. Neuro: Alert and oriented X 3. Psych:  Good affect, responds appropriately  Labs    Chemistry Recent Labs  Lab 08/11/19 0045 08/11/19 0810  08/11/19 2046 08/12/19 0641 08/13/19 0142  NA 137 136   < > 138 137 139  K 3.5 3.4*   < > 3.9 3.9 3.3*  CL 100 102  --  102 103 107  CO2 22 22  --  23 24 20*  GLUCOSE  146* 209*  --  122* 120* 94  BUN 17 15  --  18 20 20   CREATININE 1.49* 1.69*  --  1.90* 1.97* 1.76*  CALCIUM 9.1 8.7*  --  8.5* 8.4* 8.3*  PROT 6.6 6.0*  --   --   --  5.4*  ALBUMIN 3.4* 3.0*  --   --   --  2.6*  AST 45* 48*  --   --   --  13*  ALT 57* 59*  --   --   --  27  ALKPHOS 101 93  --   --   --  86  BILITOT 0.8 0.4  --   --   --  0.3  GFRNONAA 36* 31*  --  27* 26* 30*  GFRAA 42* 36*  --  32* 30* 35*  ANIONGAP 15 12  --  13 10 12    < > = values in this interval not displayed.     Hematology Recent Labs  Lab 08/11/19 0810  08/11/19 1515 08/12/19 0641 08/13/19 0142  WBC 27.3*  --   --  11.9* 9.7  RBC 3.21*  --   --  3.38* 2.99*  HGB 9.6*   < > 9.9* 10.1* 9.0*  HCT 30.6*   < > 29.0* 33.2* 29.4*  MCV 95.3  --   --  98.2 98.3  MCH 29.9  --   --  29.9 30.1  MCHC 31.4  --   --  30.4 30.6  RDW 16.5*  --   --  17.1* 17.0*  PLT 458*  --   --  302 268   < > = values in this interval not displayed.    Cardiac Enzymes High Sensitivity Troponin:   Recent Labs  Lab 08/11/19 0656 08/11/19 1540 08/11/19 2046  TROPONINIHS 38* 344* 229*       Radiology    No results found.  Cardiac Studies   2D echo 08/11/2019 IMPRESSIONS    1. Left ventricular ejection fraction, by visual estimation, is 20 to 25%. The left ventricle has severely decreased function. Normal left ventricular size. There is no left ventricular hypertrophy.  2. Definity contrast agent was given IV to delineate the left ventricular endocardial borders.  3. Left ventricular diastolic Doppler parameters are consistent with impaired relaxation pattern of LV diastolic filling.  4. LVEF is severely depressed with diffuse hypokinesis; septal and apical akinesis.  5. Global right ventricle has normal systolic function.The right ventricular size is normal. No increase in right ventricular wall thickness.  6. Left atrial size was normal.  7. Right atrial size was normal.  8. The mitral valve is grossly normal.  Trace mitral valve regurgitation.  9. The tricuspid valve is normal in structure. Tricuspid valve regurgitation is trivial. 10. The aortic valve is normal in structure. Aortic valve regurgitation is trivial by color flow Doppler. 11. The pulmonic valve was not well visualized. Pulmonic valve regurgitation is not visualized by color flow Doppler.  Patient Profile     63 y.o. female with a hx colon cancer, recent right nephrectomy for renal abscess, multiple sclerosis with residual mild right sided weakness who is being seen today for the evaluation of low EF   Assessment & Plan    1. Acute systolic heart failure - ?Takotsubo CM - has no hx CAD, no ischemic eval yet, hoping that renal function will improve enough to cath (decreased renal function w/ only 1 kidney)>>recheck today - EF 20-25% w/ apical AK - Had some heart block>>no BB till now, but HR is elevated and no heart block in > 48 hr, discuss adding low-dose BB w/ MD - no ACE/ARB w/ decreased renal function - hydralazine 10 mg tid added 10/28 - add Imdur 15 mg 10/29  2. Acute metabolic encephalopathy - follow, improved  3. Cardiac arrest - VF arrest in setting of respiratory depression (had lorazepam) -  No sig arrhythmia on tele  4. AKi with recent right nephrectomy 07/22/2019 -Scr worsen 1.49>>1.69>>1.9>>1.97>>1.76>>pending -continue to follow and hopefully if improved then plan limited coronary cath    The risks and benefits of a cardiac catheterization including, but not limited to, death, stroke, MI, kidney damage and bleeding were discussed with the patient who indicates understanding and agrees to proceed.   She has had breakfast today, could to this pm, if renal function good enough. BMET ordered.  For questions or updates, please contact [place] Please consult [email or web address] for contact info under Cardiology/STEMI.      Signed, Rosaria Ferries, PA-C  08/14/2019, 8:37 AM

## 2019-08-14 NOTE — Progress Notes (Signed)
CCMD called to advise ST elevation is increasing for patient. Patient is asymptomatic. EKG obtained and Hospitalist notified. Will continue to monitor.

## 2019-08-14 NOTE — Progress Notes (Signed)
Cath today revealed normal coronary arteries.  Will treat for DCM which is likely Takotsubo DCM and hopefully will improve in a few weeks.  Continue Hydralazin and Imdur.  Will see in am for uptitration of HF meds.

## 2019-08-15 ENCOUNTER — Encounter (HOSPITAL_COMMUNITY): Payer: Self-pay | Admitting: Cardiovascular Disease

## 2019-08-15 DIAGNOSIS — I420 Dilated cardiomyopathy: Secondary | ICD-10-CM | POA: Diagnosis not present

## 2019-08-15 DIAGNOSIS — I10 Essential (primary) hypertension: Secondary | ICD-10-CM | POA: Diagnosis not present

## 2019-08-15 DIAGNOSIS — I4901 Ventricular fibrillation: Secondary | ICD-10-CM | POA: Diagnosis not present

## 2019-08-15 DIAGNOSIS — N179 Acute kidney failure, unspecified: Secondary | ICD-10-CM | POA: Diagnosis not present

## 2019-08-15 LAB — CBC
HCT: 29.6 % — ABNORMAL LOW (ref 36.0–46.0)
Hemoglobin: 9 g/dL — ABNORMAL LOW (ref 12.0–15.0)
MCH: 29.7 pg (ref 26.0–34.0)
MCHC: 30.4 g/dL (ref 30.0–36.0)
MCV: 97.7 fL (ref 80.0–100.0)
Platelets: 257 10*3/uL (ref 150–400)
RBC: 3.03 MIL/uL — ABNORMAL LOW (ref 3.87–5.11)
RDW: 16.5 % — ABNORMAL HIGH (ref 11.5–15.5)
WBC: 7.7 10*3/uL (ref 4.0–10.5)
nRBC: 0 % (ref 0.0–0.2)

## 2019-08-15 LAB — BASIC METABOLIC PANEL
Anion gap: 11 (ref 5–15)
BUN: 15 mg/dL (ref 8–23)
CO2: 21 mmol/L — ABNORMAL LOW (ref 22–32)
Calcium: 8.3 mg/dL — ABNORMAL LOW (ref 8.9–10.3)
Chloride: 107 mmol/L (ref 98–111)
Creatinine, Ser: 1.49 mg/dL — ABNORMAL HIGH (ref 0.44–1.00)
GFR calc Af Amer: 42 mL/min — ABNORMAL LOW (ref >60)
GFR calc non Af Amer: 36 mL/min — ABNORMAL LOW (ref >60)
Glucose, Bld: 98 mg/dL (ref 70–99)
Potassium: 3.3 mmol/L — ABNORMAL LOW (ref 3.5–5.1)
Sodium: 139 mmol/L (ref 135–145)

## 2019-08-15 MED ORDER — CARVEDILOL 3.125 MG PO TABS
3.1250 mg | ORAL_TABLET | Freq: Two times a day (BID) | ORAL | Status: DC
  Administered 2019-08-15 (×2): 3.125 mg via ORAL
  Filled 2019-08-15 (×3): qty 1

## 2019-08-15 MED ORDER — HYDRALAZINE HCL 25 MG PO TABS
25.0000 mg | ORAL_TABLET | Freq: Three times a day (TID) | ORAL | Status: DC
  Administered 2019-08-15 – 2019-08-20 (×15): 25 mg via ORAL
  Filled 2019-08-15 (×15): qty 1

## 2019-08-15 MED ORDER — POTASSIUM CHLORIDE 20 MEQ PO PACK
40.0000 meq | PACK | Freq: Once | ORAL | Status: AC
  Administered 2019-08-15: 40 meq via ORAL
  Filled 2019-08-15: qty 2

## 2019-08-15 NOTE — Progress Notes (Signed)
Physical Therapy Treatment Patient Details Name: Jeanne Bruce MRN: [id number] DOB: 1956-10-10 Today's Date: 08/15/2019    History of Present Illness 63 year old woman with a history of MS, colon cancer with colostomy, recent right nephrectomy for renal abscess (discharged mid October).  Presented with progressive rt-sided weakness, lethargy and then acute focal neurological signs concerning for possible stroke.  She had a paradoxical reaction to low-dose lorazepam, resulted in respiratory depression and a VF arrest.  She underwent brief CPR, required intubation mechanical ventilation. MRI brain does not show any evidence of CVA. EEG without any evidence of seizure activity; echocardiogram 10/26 that showed a new drop in EF 20-25% with septal and apical akinesis and evidence for diastolic dysfunction. Severe sepsis with encephalopathy    PT Comments    Patient seen for mobility progression. Pt reports feeling well and agreeable to participate in PT. Pt requires min guard/min A for OOB mobility with RW. Pt tolerated gait distance of 100 ft well. Distance limited by therapist due to elevated HR to 140 bpm noted on telemetry. HR decreased to 103 bpm after a few minutes rest. Pt asymptomatic. BP and SpO2 WNL on RA. RN notified of HR. Continue to progress as tolerated.     Follow Up Recommendations  CIR;Supervision/Assistance - 24 hour     Equipment Recommendations  None recommended by PT    Recommendations for Other Services       Precautions / Restrictions Precautions Precautions: Fall Precaution Comments: ostomy L abdomen/ bandage R side of abdomen Restrictions Weight Bearing Restrictions: No    Mobility  Bed Mobility Overal bed mobility: Needs Assistance Bed Mobility: Supine to Sit     Supine to sit: Supervision;HOB elevated     General bed mobility comments: use of rail; supervision for safety  Transfers Overall transfer level: Needs assistance Equipment used: Rolling walker  (2 wheeled) Transfers: Sit to/from [facility] Sit to Stand: Min guard Stand pivot transfers: Min guard       General transfer comment: use of RW upon standing; cues for safe hand placement; pt transfered bed to [facility] prior to ambulating in hallway  Ambulation/Gait Ambulation/Gait assistance: Min assist;+2 safety/equipment;Min guard(chair follow) Gait Distance (Feet): 100 Feet Assistive device: Rolling walker (2 wheeled) Gait Pattern/deviations: Step-through pattern;Decreased stride length;Trendelenburg;Decreased dorsiflexion - left Gait velocity: decr   General Gait Details: grossly min guard but min A at times due to drifting R/L; cues for maintaining safe proximity to [employer]    Modified Rankin (Stroke Patients Only)       Balance Overall balance assessment: Mild deficits observed, not formally tested                                          Cognition Arousal/Alertness: Awake/alert Behavior During Therapy: WFL for tasks assessed/performed Overall Cognitive Status: Within Functional Limits for tasks assessed                                 General Comments: reports not remembering events earlier this week      Exercises      General Comments General comments (skin integrity, edema, etc.): noted HR up to 140 bpm and STV 4.3 seconds so gait distance cut short; pt asymptomatic; other vitals  WNL on RA       Pertinent Vitals/Pain Pain Assessment: No/denies pain    Home Living                      Prior Function            PT Goals (current goals can now be found in the care plan section) Progress towards PT goals: Progressing toward goals    Frequency    Min 3X/week      PT Plan Current plan remains appropriate    Co-evaluation              AM-PAC PT "6 Clicks" Mobility   Outcome Measure  Help needed turning from your back to your side while  in a flat bed without using bedrails?: A Little Help needed moving from lying on your back to sitting on the side of a flat bed without using bedrails?: A Little Help needed moving to and from a bed to a chair (including a wheelchair)?: A Little Help needed standing up from a chair using your arms (e.g., wheelchair or bedside chair)?: A Little Help needed to walk in hospital room?: A Little Help needed climbing 3-5 steps with a railing? : A Lot 6 Click Score: 17    End of Session Equipment Utilized During Treatment: Gait belt Activity Tolerance: Patient tolerated treatment well Patient left: in chair;with call bell/phone within reach;with chair alarm set Nurse Communication: Mobility status PT Visit Diagnosis: Other abnormalities of gait and mobility (R26.89);Muscle weakness (generalized) (M62.81);Other symptoms and signs involving the nervous system (R29.898)     Time: 6301-6010 PT Time Calculation (min) (ACUTE ONLY): 27 min  Charges:  $Gait Training: 23-37 mins                     Earney Navy, PTA Acute Rehabilitation Services Pager: [phone] Office: [phone]     Darliss Cheney 08/15/2019, 11:16 AM

## 2019-08-15 NOTE — Progress Notes (Signed)
Progress Note  Patient Name: Jeanne Bruce Date of Encounter: 08/15/2019  Primary Cardiologist: Fransico Him, MD   Subjective   Cath yesterday with normal coronary arteries.  Likely Takotsubo CM.  Denies any CP or SOB this am.   Inpatient Medications    Scheduled Meds: . aspirin  325 mg Oral Daily  . cefdinir  300 mg Oral Daily  . Chlorhexidine Gluconate Cloth  6 each Topical Daily  . enoxaparin (LOVENOX) injection  30 mg Subcutaneous Daily  . hydrALAZINE  10 mg Oral Q8H  . isosorbide mononitrate  15 mg Oral Daily  . sodium chloride flush  3 mL Intravenous Q12H   Continuous Infusions: . sodium chloride Stopped (08/14/19 1651)  . sodium chloride     PRN Meds: sodium chloride, oxyCODONE, sodium chloride flush   Vital Signs    Vitals:   08/15/19 0500 08/15/19 0517 08/15/19 0645 08/15/19 0805  BP:  136/61  (!) 157/70  Pulse:  77    Resp:      Temp:   98.6 F (37 C)   TempSrc:   Oral   SpO2:  96%    Weight: 58.4 kg     Height:        Intake/Output Summary (Last 24 hours) at 08/15/2019 0849 Last data filed at 08/15/2019 0700 Gross per 24 hour  Intake 396.45 ml  Output 200 ml  Net 196.45 ml   Filed Weights   08/11/19 0051 08/11/19 1435 08/15/19 0500  Weight: 59.8 kg 60.4 kg 58.4 kg    Telemetry    NSR - Personally Reviewed  ECG    No new EKG to review - Personally Reviewed  Physical Exam   GEN: No acute distress.   Neck: No JVD Cardiac: RRR, no murmurs, rubs, or gallops.  Respiratory: Clear to auscultation bilaterally. GI: Soft, nontender, non-distended  MS: No edema; No deformity. Neuro:  Nonfocal  Psych: Normal affect   Labs    Chemistry Recent Labs  Lab 08/11/19 0045 08/11/19 0810  08/13/19 0142 08/14/19 0950 08/15/19 0330  NA 137 136   < > 139 139 139  K 3.5 3.4*   < > 3.3* 3.8 3.3*  CL 100 102   < > 107 107 107  CO2 22 22   < > 20* 20* 21*  GLUCOSE 146* 209*   < > 94 116* 98  BUN 17 15   < > 20 14 15   CREATININE 1.49* 1.69*   <  > 1.76* 1.50* 1.49*  CALCIUM 9.1 8.7*   < > 8.3* 8.5* PENDING  PROT 6.6 6.0*  --  5.4*  --   --   ALBUMIN 3.4* 3.0*  --  2.6*  --   --   AST 45* 48*  --  13*  --   --   ALT 57* 59*  --  27  --   --   ALKPHOS 101 93  --  86  --   --   BILITOT 0.8 0.4  --  0.3  --   --   GFRNONAA 36* 31*   < > 30* 36* 36*  GFRAA 42* 36*   < > 35* 42* 42*  ANIONGAP 15 12   < > 12 12 11    < > = values in this interval not displayed.     Hematology Recent Labs  Lab 08/12/19 0641 08/13/19 0142 08/15/19 0330  WBC 11.9* 9.7 7.7  RBC 3.38* 2.99* 3.03*  HGB 10.1* 9.0* 9.0*  HCT 33.2* 29.4* 29.6*  MCV 98.2 98.3 97.7  MCH 29.9 30.1 29.7  MCHC 30.4 30.6 30.4  RDW 17.1* 17.0* 16.5*  PLT 302 268 257    Cardiac EnzymesNo results for input(s): TROPONINI in the last 168 hours. No results for input(s): TROPIPOC in the last 168 hours.   BNPNo results for input(s): BNP, PROBNP in the last 168 hours.   DDimer No results for input(s): DDIMER in the last 168 hours.   Radiology    No results found.  Cardiac Studies   Cardiac cath 08/14/2019 Conclusion  1. No angiographic evidence of CAD 2. Non-ischemic cardiomyopathy   2D echo 08/11/2019 IMPRESSIONS    1. Left ventricular ejection fraction, by visual estimation, is 20 to 25%. The left ventricle has severely decreased function. Normal left ventricular size. There is no left ventricular hypertrophy.  2. Definity contrast agent was given IV to delineate the left ventricular endocardial borders.  3. Left ventricular diastolic Doppler parameters are consistent with impaired relaxation pattern of LV diastolic filling.  4. LVEF is severely depressed with diffuse hypokinesis; septal and apical akinesis.  5. Global right ventricle has normal systolic function.The right ventricular size is normal. No increase in right ventricular wall thickness.  6. Left atrial size was normal.  7. Right atrial size was normal.  8. The mitral valve is grossly normal. Trace  mitral valve regurgitation.  9. The tricuspid valve is normal in structure. Tricuspid valve regurgitation is trivial. 10. The aortic valve is normal in structure. Aortic valve regurgitation is trivial by color flow Doppler. 11. The pulmonic valve was not well visualized. Pulmonic valve regurgitation is not visualized by color flow Doppler.   Patient Profile     63 y.o. female with a hx colon cancer, recent right nephrectomy for renal abscess, multiple sclerosis with residual mild right sided weaknesswho is being seen today for the evaluation of low EF   Assessment & Plan    1. Acute systolic heart failure - cath with normal coronary arteries c/w Takotsubo stress MI with DCM - EF 20-25% w/ apical AK - Had some heart block around time of respiratory arrest>>no BB till now, but HR is elevated and no heart block in > 48 hr - no ACE/ARB w/ decreased renal function and only 1 kidney - creatinine post cath stable at 1.49 - BP remains controlled  - increase hydralazine to 25mg  TID and continue Imdur 15mg  daily - add carvedilol 3.125mg  BID and watch on tele - consider addition of low dose spiro at discharge  2. Acute metabolic encephalopathy - resolved  3. Cardiac arrest - VF arrest in setting of respiratory depression (had lorazepam) -  No sig arrhythmia on tele - one EKG with ? Transient heart block around the time of respiratory arrest  4. AKi with recent right nephrectomy 07/22/2019 -Scr worsen 1.49>>1.69>>1.9>>1.97>>1.76>>1.49 today post cath    For questions or updates, please contact [place] Please consult [email or web address] for contact info under Cardiology/STEMI.      Signed, Fransico Him, MD  08/15/2019, 8:49 AM

## 2019-08-15 NOTE — Progress Notes (Signed)
PROGRESS NOTE    Jeanne Bruce  JJH:[id number] DOB: 04-14-1956 DOA: 08/11/2019 PCP: Derrill Center., MD    Brief Narrative:  63 year old woman with MS, history of colon cancer with a colostomy, recent right nephrectomy for renal abscess (October). She is admitted with progressive weakness and then acute focal neurological signs concerning for acute CVA. She then suffered a brief respiratory and VF arrest in the ED after receiving low-dose lorazepam as evaluation was underway. She was intubated, resuscitated with 1 round of CPR. Hypothermia protocol was deferred. Her MRI, EEG were reassuring. She has been treated with empiric antibiotics with a working diagnosis of sepsis as a precipitant of acute encephalopathy.  Urine culture showed multiple species.    Assessment & Plan:   Principal Problem:   Acute CVA (cerebrovascular accident) ([facility]) Active Problems:   Acute lower UTI   Essential hypertension   Acute encephalopathy   CKD (chronic kidney disease) stage 4, GFR 15-29 ml/min (HCC)   Multiple sclerosis (HCC)   Acute respiratory failure (HCC)   Renal insufficiency   VF (ventricular fibrillation) (HCC)   Pressure injury of skin   DCM (dilated cardiomyopathy) ([facility])   AKI (acute kidney injury) ([facility])   Acute encephalopathy, presumed due to sepsis, currently improved. No evidence of CVA or residual neurological deficit. Suspect urinary tract source versus wound infection. -Switched to cefdinir -Wound ostomy consultation recommendations -Mental status improved  VF arrest presumed due to respiratory suppression following lorazepam. Evidence for severe LV dysfunction? stress reaction versus CAD versus other. -Appreciate cardiology evaluation.  -Cardiac catheterization did not show any acute findings.  Per cardiology suspect Takotsubo.  Acute on chronic renal insufficiency -Follow BMP, urine output with restoration renal perfusion -Avoid nephrotoxins -Holding diuretics for now   UTI: -Urine culture showed multiple species.  However, she did have some leukocytosis along with encephalopathy.  Currently on cefdinir. -Denies symptoms.  Consider discontinue after today's dose.   DVT prophylaxis: Subcutaneous Lovenox Code Status: Full code Family Communication:  Discussed with family at bedside.  Disposition Plan: CIR?  Consultants:   Cardiology  Procedures:   Cardiac cath  Antimicrobials:   Ceftriaxone, Flagyl (10/26-10/27), cefdinir 08/13/2019  Subjective: No acute events.  She denies having any fevers, chills, shortness of breath, diarrhea or dysuria at this time.  Objective: Vitals:   08/15/19 0500 08/15/19 0517 08/15/19 0645 08/15/19 0805  BP:  136/61  (!) 157/70  Pulse:  77    Resp:      Temp:   98.6 F (37 C)   TempSrc:   Oral   SpO2:  96%    Weight: 58.4 kg     Height:        Intake/Output Summary (Last 24 hours) at 08/15/2019 1141 Last data filed at 08/15/2019 0700 Gross per 24 hour  Intake 396.45 ml  Output 200 ml  Net 196.45 ml   Filed Weights   08/11/19 0051 08/11/19 1435 08/15/19 0500  Weight: 59.8 kg 60.4 kg 58.4 kg    Examination:  General exam: Appears calm and comfortable  Respiratory system: Clear to auscultation. Respiratory effort normal. Cardiovascular system: S1 & S2 heard, RRR.  Murmur. No pedal edema. Gastrointestinal system: Abdomen is nondistended, soft and nontender.  Has ostomy. Normal bowel sounds heard. Central nervous system: Alert and oriented. No focal neurological deficits. Extremities: Symmetric 5 x 5 power. Skin: No rashes, lesions or ulcers Psychiatry: Judgement and insight appear normal. Mood & affect appropriate.     Data Reviewed: I have personally reviewed following  labs and imaging studies  CBC: Recent Labs  Lab 08/11/19 0045 08/11/19 0810 08/11/19 0939 08/11/19 1515 08/12/19 0641 08/13/19 0142 08/15/19 0330  WBC 10.5 27.3*  --   --  11.9* 9.7 7.7  NEUTROABS 6.3  --   --    --  8.9*  --   --   HGB 9.9* 9.6* 9.5* 9.9* 10.1* 9.0* 9.0*  HCT 32.0* 30.6* 28.0* 29.0* 33.2* 29.4* 29.6*  MCV 95.5 95.3  --   --  98.2 98.3 97.7  PLT 411* 458*  --   --  302 268 119   Basic Metabolic Panel: Recent Labs  Lab 08/11/19 0656  08/11/19 2046 08/12/19 0641 08/13/19 0142 08/14/19 0950 08/15/19 0330  NA  --    < > 138 137 139 139 139  K  --    < > 3.9 3.9 3.3* 3.8 3.3*  CL  --    < > 102 103 107 107 107  CO2  --    < > 23 24 20* 20* 21*  GLUCOSE  --    < > 122* 120* 94 116* 98  BUN  --    < > 18 20 20 14 15   CREATININE  --    < > 1.90* 1.97* 1.76* 1.50* 1.49*  CALCIUM  --    < > 8.5* 8.4* 8.3* 8.5* 8.3*  MG 2.6*  --  2.4 2.3  --   --   --    < > = values in this interval not displayed.   GFR: Estimated Creatinine Clearance: 29.8 mL/min (A) (by C-G formula based on SCr of 1.49 mg/dL (H)). Liver Function Tests: Recent Labs  Lab 08/11/19 0045 08/11/19 0810 08/13/19 0142  AST 45* 48* 13*  ALT 57* 59* 27  ALKPHOS 101 93 86  BILITOT 0.8 0.4 0.3  PROT 6.6 6.0* 5.4*  ALBUMIN 3.4* 3.0* 2.6*   No results for input(s): LIPASE, AMYLASE in the last 168 hours. No results for input(s): AMMONIA in the last 168 hours. Coagulation Profile: Recent Labs  Lab 08/11/19 0045  INR 1.1   Cardiac Enzymes: No results for input(s): CKTOTAL, CKMB, CKMBINDEX, TROPONINI in the last 168 hours. BNP (last 3 results) No results for input(s): PROBNP in the last 8760 hours. HbA1C: No results for input(s): HGBA1C in the last 72 hours. CBG: Recent Labs  Lab 08/13/19 1601 08/13/19 1956 08/14/19 0005 08/14/19 0346 08/14/19 1212  GLUCAP 135* 124* 105* 104* 88   Lipid Profile: No results for input(s): CHOL, HDL, LDLCALC, TRIG, CHOLHDL, LDLDIRECT in the last 72 hours. Thyroid Function Tests: No results for input(s): TSH, T4TOTAL, FREET4, T3FREE, THYROIDAB in the last 72 hours. Anemia Panel: No results for input(s): VITAMINB12, FOLATE, FERRITIN, TIBC, IRON, RETICCTPCT in the last  72 hours. Sepsis Labs: No results for input(s): PROCALCITON, LATICACIDVEN in the last 168 hours.  Recent Results (from the past 240 hour(s))  Urine culture     Status: Abnormal   Collection Time: 08/11/19  1:13 AM   Specimen: Urine, Catheterized  Result Value Ref Range Status   Specimen Description   Final    URINE, CATHETERIZED Performed at [facility], [address] [place], [place] [address]    Special Requests   Final    NONE Performed at [facility], [address] [place], [place] [address]    Culture MULTIPLE SPECIES PRESENT, SUGGEST RECOLLECTION (A)  Final   Report Status 08/12/2019 FINAL  Final  SARS  CORONAVIRUS 2 (TAT 6-24 HRS) Nasopharyngeal Nasopharyngeal Swab     Status: None   Collection Time: 08/11/19  2:08 AM   Specimen: Nasopharyngeal Swab  Result Value Ref Range Status   SARS Coronavirus 2 NEGATIVE NEGATIVE Final    Comment: (NOTE) SARS-CoV-2 target nucleic acids are NOT DETECTED. The SARS-CoV-2 RNA is generally detectable in upper and lower respiratory specimens during the acute phase of infection. Negative results do not preclude SARS-CoV-2 infection, do not rule out co-infections with other pathogens, and should not be used as the sole basis for treatment or other patient management decisions. Negative results must be combined with clinical observations, patient history, and epidemiological information. The expected result is Negative. Fact Sheet for Patients: SugarRoll.be Fact Sheet for Healthcare Providers: https://www.woods-mathews.com/ This test is not yet approved or cleared by the [place] FDA and  has been authorized for detection and/or diagnosis of SARS-CoV-2 by FDA under an Emergency Use Authorization (EUA). This EUA will remain  in effect (meaning this test can be used) for the duration of the COVID-19 declaration under Section 56 4(b)(1) of the Act, 21 U.S.C. section  360bbb-3(b)(1), unless the authorization is terminated or revoked sooner. Performed at [facility], [place] [address] [place], [place] [address]   Culture, blood (Routine X 2) w Reflex to ID Panel     Status: None (Preliminary result)   Collection Time: 08/11/19  8:36 AM   Specimen: BLOOD LEFT HAND  Result Value Ref Range Status   Specimen Description BLOOD LEFT HAND  Final   Special Requests   Final    BOTTLES DRAWN AEROBIC ONLY Blood Culture results may not be optimal due to an inadequate volume of blood received in culture bottles   Culture   Final    NO GROWTH 4 DAYS Performed at [facility], [place] [address] [place], [place] [address]    Report Status PENDING  Incomplete  Culture, blood (Routine X 2) w Reflex to ID Panel     Status: None (Preliminary result)   Collection Time: 08/11/19  9:30 AM   Specimen: BLOOD LEFT HAND  Result Value Ref Range Status   Specimen Description BLOOD LEFT HAND  Final   Special Requests   Final    BOTTLES DRAWN AEROBIC ONLY Blood Culture results may not be optimal due to an inadequate volume of blood received in culture bottles   Culture   Final    NO GROWTH 4 DAYS Performed at [facility], [place] [address], [place], [place] [address]    Report Status PENDING  Incomplete  SARS Coronavirus 2 by RT PCR (hospital order, performed in [facility] hospital lab) Nasopharyngeal Nasopharyngeal Swab     Status: None   Collection Time: 08/11/19 10:23 AM   Specimen: Nasopharyngeal Swab  Result Value Ref Range Status   SARS Coronavirus 2 NEGATIVE NEGATIVE Final    Comment: (NOTE) If result is NEGATIVE SARS-CoV-2 target nucleic acids are NOT DETECTED. The SARS-CoV-2 RNA is generally detectable in upper and lower  respiratory specimens during the acute phase of infection. The lowest  concentration of SARS-CoV-2 viral copies this assay can detect is 250  copies / mL. A negative result does not preclude SARS-CoV-2 infection  and should not be  used as the sole basis for treatment or other  patient management decisions.  A negative result may occur with  improper specimen collection / handling, submission of specimen other  than nasopharyngeal swab, presence of viral mutation(s)  within the  areas targeted by this assay, and inadequate number of viral copies  (<250 copies / mL). A negative result must be combined with clinical  observations, patient history, and epidemiological information. If result is POSITIVE SARS-CoV-2 target nucleic acids are DETECTED. The SARS-CoV-2 RNA is generally detectable in upper and lower  respiratory specimens dur ing the acute phase of infection.  Positive  results are indicative of active infection with SARS-CoV-2.  Clinical  correlation with patient history and other diagnostic information is  necessary to determine patient infection status.  Positive results do  not rule out bacterial infection or co-infection with other viruses. If result is PRESUMPTIVE POSTIVE SARS-CoV-2 nucleic acids MAY BE PRESENT.   A presumptive positive result was obtained on the submitted specimen  and confirmed on repeat testing.  While 2019 novel coronavirus  (SARS-CoV-2) nucleic acids may be present in the submitted sample  additional confirmatory testing may be necessary for epidemiological  and / or clinical management purposes  to differentiate between  SARS-CoV-2 and other Sarbecovirus currently known to infect humans.  If clinically indicated additional testing with an alternate test  methodology [phone]) is advised. The SARS-CoV-2 RNA is generally  detectable in upper and lower respiratory sp ecimens during the acute  phase of infection. The expected result is Negative. Fact Sheet for Patients:  StrictlyIdeas.no Fact Sheet for Healthcare Providers: BankingDealers.co.za This test is not yet approved or cleared by the [place] FDA and has been authorized  for detection and/or diagnosis of SARS-CoV-2 by FDA under an Emergency Use Authorization (EUA).  This EUA will remain in effect (meaning this test can be used) for the duration of the COVID-19 declaration under Section 564(b)(1) of the Act, 21 U.S.C. section 360bbb-3(b)(1), unless the authorization is terminated or revoked sooner. Performed at [facility], [place] [address], [place], [place] [address]   MRSA PCR Screening     Status: None   Collection Time: 08/11/19  3:01 PM   Specimen: Nasal Mucosa; Nasopharyngeal  Result Value Ref Range Status   MRSA by PCR NEGATIVE NEGATIVE Final    Comment:        The GeneXpert MRSA Assay (FDA approved for NASAL specimens only), is one component of a comprehensive MRSA colonization surveillance program. It is not intended to diagnose MRSA infection nor to guide or monitor treatment for MRSA infections. Performed at [facility], [place] [address], [place], [place] [address]          Radiology Studies: No results found.      Scheduled Meds: . aspirin  325 mg Oral Daily  . carvedilol  3.125 mg Oral BID WC  . cefdinir  300 mg Oral Daily  . Chlorhexidine Gluconate Cloth  6 each Topical Daily  . enoxaparin (LOVENOX) injection  30 mg Subcutaneous Daily  . hydrALAZINE  25 mg Oral Q8H  . isosorbide mononitrate  15 mg Oral Daily  . sodium chloride flush  3 mL Intravenous Q12H   Continuous Infusions: . sodium chloride Stopped (08/14/19 1651)  . sodium chloride       LOS: 4 days    Yaakov Guthrie MD [facility] Pager on amion  If 7PM-7AM, please contact night-coverage [email or web address] Password [email or web address] 08/15/2019, 11:41 AM

## 2019-08-16 DIAGNOSIS — I5181 Takotsubo syndrome: Secondary | ICD-10-CM | POA: Diagnosis not present

## 2019-08-16 DIAGNOSIS — I420 Dilated cardiomyopathy: Secondary | ICD-10-CM | POA: Diagnosis not present

## 2019-08-16 LAB — CULTURE, BLOOD (ROUTINE X 2)
Culture: NO GROWTH
Culture: NO GROWTH

## 2019-08-16 LAB — CBC
HCT: 29.1 % — ABNORMAL LOW (ref 36.0–46.0)
Hemoglobin: 9 g/dL — ABNORMAL LOW (ref 12.0–15.0)
MCH: 29.9 pg (ref 26.0–34.0)
MCHC: 30.9 g/dL (ref 30.0–36.0)
MCV: 96.7 fL (ref 80.0–100.0)
Platelets: 237 10*3/uL (ref 150–400)
RBC: 3.01 MIL/uL — ABNORMAL LOW (ref 3.87–5.11)
RDW: 16.3 % — ABNORMAL HIGH (ref 11.5–15.5)
WBC: 7.5 10*3/uL (ref 4.0–10.5)
nRBC: 0 % (ref 0.0–0.2)

## 2019-08-16 LAB — BASIC METABOLIC PANEL
Anion gap: 7 (ref 5–15)
BUN: 14 mg/dL (ref 8–23)
CO2: 22 mmol/L (ref 22–32)
Calcium: 8.4 mg/dL — ABNORMAL LOW (ref 8.9–10.3)
Chloride: 109 mmol/L (ref 98–111)
Creatinine, Ser: 1.46 mg/dL — ABNORMAL HIGH (ref 0.44–1.00)
GFR calc Af Amer: 43 mL/min — ABNORMAL LOW (ref >60)
GFR calc non Af Amer: 37 mL/min — ABNORMAL LOW (ref >60)
Glucose, Bld: 96 mg/dL (ref 70–99)
Potassium: 4 mmol/L (ref 3.5–5.1)
Sodium: 138 mmol/L (ref 135–145)

## 2019-08-16 MED ORDER — CARVEDILOL 6.25 MG PO TABS
6.2500 mg | ORAL_TABLET | Freq: Two times a day (BID) | ORAL | Status: DC
  Administered 2019-08-16 – 2019-08-20 (×9): 6.25 mg via ORAL
  Filled 2019-08-16 (×9): qty 1

## 2019-08-16 NOTE — Progress Notes (Signed)
Progress Note  Patient Name: Jeanne Bruce Date of Encounter: 08/16/2019  Primary Cardiologist: Fransico Him, MD   Subjective   63 year old female who was admitted with urosepsis and confusion and weakness.  She was initially felt to have a CVA but MRI showed no findings of a CVA.  The mental status changes were thought to be due to septic encephalopathy.  She had an ejection fraction of 25% on echo with apical ballooning.  Heart catheterization several days ago revealed normal coronary arteries and she is thought to have Takotsubo syndrome.  She is now on hydralazine, Imdur  and carvedilol.  We are avoiding ARB's because of elevated creatinine in the fact that she has just 1 kidney.  I discussed her progress with the patient and her daughter by  FaceTime /  cell phone today.  She has done very well and is ready to be discharged from a cardiac standpoint.  Inpatient Medications    Scheduled Meds: . aspirin  325 mg Oral Daily  . carvedilol  3.125 mg Oral BID WC  . cefdinir  300 mg Oral Daily  . Chlorhexidine Gluconate Cloth  6 each Topical Daily  . enoxaparin (LOVENOX) injection  30 mg Subcutaneous Daily  . hydrALAZINE  25 mg Oral Q8H  . isosorbide mononitrate  15 mg Oral Daily  . sodium chloride flush  3 mL Intravenous Q12H   Continuous Infusions: . sodium chloride Stopped (08/14/19 1651)  . sodium chloride     PRN Meds: sodium chloride, oxyCODONE, sodium chloride flush   Vital Signs    Vitals:   08/15/19 2059 08/15/19 2345 08/16/19 0510 08/16/19 0548  BP: 132/68 (!) 146/68 (!) 157/81 (!) 149/70  Pulse: 91  84   Resp: 18  18   Temp: 98 F (36.7 C)  98.1 F (36.7 C)   TempSrc: Oral  Oral   SpO2: 96%  99%   Weight:   58.3 kg   Height:        Intake/Output Summary (Last 24 hours) at 08/16/2019 9381 Last data filed at 08/16/2019 0612 Gross per 24 hour  Intake 600 ml  Output 700 ml  Net -100 ml   Last 3 Weights 08/16/2019 08/15/2019 08/11/2019  Weight (lbs) 128 lb  9.6 oz 128 lb 12.8 oz 133 lb 2.5 oz  Weight (kg) 58.333 kg 58.423 kg 60.4 kg      Telemetry     Sinus tach  - Personally Reviewed  ECG     - Personally Reviewed  Physical Exam   GEN: No acute distress.   Neck: No JVD Cardiac: RRR, no murmurs, rubs, or gallops.  Respiratory: Clear to auscultation bilaterally. GI: Soft, nontender, non-distended  MS: No edema; No deformity. Neuro:  Nonfocal  Psych: Normal affect   Labs    High Sensitivity Troponin:   Recent Labs  Lab 08/11/19 0656 08/11/19 1540 08/11/19 2046  TROPONINIHS 38* 344* 229*      Chemistry Recent Labs  Lab 08/11/19 0045 08/11/19 0810  08/13/19 0142 08/14/19 0950 08/15/19 0330 08/16/19 0443  NA 137 136   < > 139 139 139 138  K 3.5 3.4*   < > 3.3* 3.8 3.3* 4.0  CL 100 102   < > 107 107 107 109  CO2 22 22   < > 20* 20* 21* 22  GLUCOSE 146* 209*   < > 94 116* 98 96  BUN 17 15   < > 20 14 15 14   CREATININE 1.49* 1.69*   < >  1.76* 1.50* 1.49* 1.46*  CALCIUM 9.1 8.7*   < > 8.3* 8.5* 8.3* 8.4*  PROT 6.6 6.0*  --  5.4*  --   --   --   ALBUMIN 3.4* 3.0*  --  2.6*  --   --   --   AST 45* 48*  --  13*  --   --   --   ALT 57* 59*  --  27  --   --   --   ALKPHOS 101 93  --  86  --   --   --   BILITOT 0.8 0.4  --  0.3  --   --   --   GFRNONAA 36* 31*   < > 30* 36* 36* 37*  GFRAA 42* 36*   < > 35* 42* 42* 43*  ANIONGAP 15 12   < > 12 12 11 7    < > = values in this interval not displayed.     Hematology Recent Labs  Lab 08/13/19 0142 08/15/19 0330 08/16/19 0443  WBC 9.7 7.7 7.5  RBC 2.99* 3.03* 3.01*  HGB 9.0* 9.0* 9.0*  HCT 29.4* 29.6* 29.1*  MCV 98.3 97.7 96.7  MCH 30.1 29.7 29.9  MCHC 30.6 30.4 30.9  RDW 17.0* 16.5* 16.3*  PLT 268 257 237    BNPNo results for input(s): BNP, PROBNP in the last 168 hours.   DDimer No results for input(s): DDIMER in the last 168 hours.   Radiology    No results found.  Cardiac Studies      Patient Profile     63 y.o. female added admitted with  urosepsis and encephalopathy.  She was found to have Takotsubo syndrome.  Assessment & Plan    1.  Acute systolic congestive heart failure: The patient was admitted with sepsis syndrome and developed Takotsubo syndrome.  She has normal coronary arteries at the time of heart catheterization.  She is done well on medical therapy and we would expect a near complete recovery from her Takotsubo syndrome.  We are avoiding ACE inhibitor's and ARB's because of her chronic kidney disease and since she has just 1 kidney.  We could consider ARB's in the future if her renal function improves. For now we will continue with hydralazine, isosorbide, carvedilol.  I will increase the carvedilol to 6.25 mg twice a day.  We will continue to titrate these medications upward as needed/as tolerated.  She will follow-up with Dr. Radford Pax ( or APP )  in the office in several weeks.  I see that she is on aspirin 325 mg a day.  There is no cardiology indication for aspirin.  My impression is that she ruled out for stroke so there may not be any indication for aspirin.  We will defer to the primary medical team.    [facility] will sign off.   Medication Recommendations:  Cont. Current meds  Other recommendations (labs, testing, etc):   Follow up as an outpatient:  With Dr. Radford Pax or APP in 2-3 weeks for her Takotsubo syndrome .   For questions or updates, please contact [place] Please consult [email or web address] for contact info under        Signed, Mertie Moores, MD  08/16/2019, 8:22 AM

## 2019-08-16 NOTE — Progress Notes (Signed)
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 She is admitted with progressive weakness and then acute focal neurological signs concerning for acute CVA. She then suffered a brief respiratory and VF arrest in the ED after receiving low-dose lorazepam as evaluation was underway. She was intubated, resuscitated with 1 round of CPR. Hypothermia protocol was deferred.HerMRI, EEG were reassuring. She has been treated with empiric antibiotics with a working diagnosis of sepsis as a precipitant of acute encephalopathy.Urine culture showed multiple species.   Cardiac medicine chest with the help of cardiology team.  Holding off on ARB/ACE inhibitor at this time.   Assessment & Plan:   Principal Problem:   Acute CVA (cerebrovascular accident) ([place]) Active Problems:   Acute lower UTI   Essential hypertension   Acute encephalopathy   CKD (chronic kidney disease) stage 4, GFR 15-29 ml/min (HCC)   Multiple sclerosis (HCC)   Acute respiratory failure (HCC)   Renal insufficiency   VF (ventricular fibrillation) (HCC)   Pressure injury of skin   DCM (dilated cardiomyopathy) ([place])   AKI (acute kidney injury) ([place])  Acute metabolic encephalopathy suspect secondary to urinary tract infection, multiple species.  Improved. Sepsis due to UTI present on admission, end organ damage-cardiomyopathy. -Continue cefdinir, complete 7-day course.  Per daughter patient has had multiple urinary tract infections in the past and follows infectious disease at [facility]. -Wound ostomy consultation recommendations -Mental status improved I have reviewed urine culture sensitivity from June 11, 2019 in [place]. -Clinically monitor, would encourage her to follow-up outpatient with infectious disease and urology once  she is recovered from this acute illness.   acute congestive heart failure with reduced ejection fraction, 25%.   Takotsubo cardiomyopathy suspected secondary to sepsis. -Seen by cardiology.  Medication adjustments-hydralazine, Coreg and isosorbide mononitrate.  Currently not a candidate for ARB/ACE inhibitor due to renal function.  Possibly can be added in the future. Limited echocardiogram can be repeated in the future.  Uncontrolled hypertension -Cardiac meds adjusted.  Coreg increased today.  Continue to monitor.  Acute on chronic renal insufficiency -Follow BMP, urine output with restoration renal perfusion -Avoid nephrotoxins -Holding diuretics for now  History of multiple sclerosis-on [employer] History of colon cancer status post colectomy/colostomy Previous history of E. coli/Pseudomonas/Enterococcus UTI with bacteremia.  Also has had previous history of retroperitoneal/perinephric fluid collection requiring IV antibiotics until radical nephrectomy 07/22/2019.  DVT prophylaxis: Lovenox Code Status: Full code Family Communication: Spoke with the daughter via face time Disposition Plan: CIR placement  Consultants:   Cardiology  Procedures:   None  Antimicrobials:   Cefdinir   Subjective: Patient does not any complaints.  Overall she is weak requiring CIR.  Slightly elevated blood pressures overnight. Spoke with daughter over face time was very concerned about stopping her urinary tract infection very quickly given her previous history of recurrent UTI especially in the past year.  She follows with infectious disease at [facility].   Review of Systems Otherwise negative except as per HPI, including: General: Denies fever, chills, night sweats or unintended weight loss. Resp: Denies cough, wheezing, shortness of breath. Cardiac: Denies chest pain, palpitations, orthopnea, paroxysmal nocturnal dyspnea. GI: Denies abdominal pain, nausea, vomiting, diarrhea or constipation  GU: Denies dysuria, frequency, hesitancy or incontinence MS: Denies muscle aches, joint pain or swelling Neuro: Denies headache, neurologic deficits (  focal weakness, numbness, tingling), abnormal gait Psych: Denies anxiety, depression, SI/HI/AVH Skin: Denies new rashes or lesions ID: Denies sick contacts, exotic exposures, travel  Objective: Vitals:   08/15/19 2059 08/16/19 0510 08/16/19 0548 08/16/19 1057  BP:  (!) 157/81 (!) 149/70 (!) 158/86  Pulse: 91 84  96  Resp: 18 18  18   Temp: 98 F (36.7 C) 98.1 F (36.7 C)  98.5 F (36.9 C)  TempSrc: Oral Oral  Oral  SpO2: 96% 99%  100%  Weight:  58.3 kg    Height:        Intake/Output Summary (Last 24 hours) at 08/16/2019 1215 Last data filed at 08/16/2019 0849 Gross per 24 hour  Intake 720 ml  Output 700 ml  Net 20 ml   Filed Weights   08/11/19 1435 08/15/19 0500 08/16/19 0510  Weight: 60.4 kg 58.4 kg 58.3 kg    Examination:  General exam: Appears calm and comfortable  Respiratory system: Clear to auscultation. Respiratory effort normal. Cardiovascular system: S1 & S2 heard, RRR. No JVD, murmurs, rubs, gallops or clicks. No pedal edema. Gastrointestinal system: Abdomen is nondistended, soft and nontender. No organomegaly or masses felt. Normal bowel sounds heard. Central nervous system: Alert and oriented. No focal neurological deficits. Extremities: Symmetric 5 x 5 power. Skin: No rashes, lesions or ulcers Psychiatry: Judgement and insight appear normal. Mood & affect appropriate.     Data Reviewed:   CBC: Recent Labs  Lab 08/11/19 0045 08/11/19 0810  08/11/19 1515 08/12/19 0641 08/13/19 0142 08/15/19 0330 08/16/19 0443  WBC 10.5 27.3*  --   --  11.9* 9.7 7.7 7.5  NEUTROABS 6.3  --   --   --  8.9*  --   --   --   HGB 9.9* 9.6*   < > 9.9* 10.1* 9.0* 9.0* 9.0*  HCT 32.0* 30.6*   < > 29.0* 33.2* 29.4* 29.6* 29.1*  MCV 95.5 95.3  --   --  98.2 98.3 97.7 96.7  PLT 411* 458*  --   --  302 268 257 237   < > =  values in this interval not displayed.   Basic Metabolic Panel: Recent Labs  Lab 08/11/19 0656  08/11/19 2046 08/12/19 0641 08/13/19 0142 08/14/19 0950 08/15/19 0330 08/16/19 0443  NA  --    < > 138 137 139 139 139 138  K  --    < > 3.9 3.9 3.3* 3.8 3.3* 4.0  CL  --    < > 102 103 107 107 107 109  CO2  --    < > 23 24 20* 20* 21* 22  GLUCOSE  --    < > 122* 120* 94 116* 98 96  BUN  --    < > 18 20 20 14 15 14   CREATININE  --    < > 1.90* 1.97* 1.76* 1.50* 1.49* 1.46*  CALCIUM  --    < > 8.5* 8.4* 8.3* 8.5* 8.3* 8.4*  MG 2.6*  --  2.4 2.3  --   --   --   --    < > = values in this interval not displayed.   GFR: Estimated Creatinine Clearance: 30.4 mL/min (A) (by C-G formula based on SCr of 1.46 mg/dL (H)). Liver Function Tests: Recent Labs  Lab 08/11/19 0045 08/11/19 0810 08/13/19 0142  AST 45* 48* 13*  ALT 57* 59* 27  ALKPHOS 101 93 86  BILITOT 0.8 0.4 0.3  PROT 6.6 6.0* 5.4*  ALBUMIN 3.4* 3.0* 2.6*   No results for input(s): LIPASE, AMYLASE in the last 168 hours. No results for input(s): AMMONIA in the last 168 hours. Coagulation Profile: Recent Labs  Lab 08/11/19 0045  INR 1.1   Cardiac Enzymes: No results for input(s): CKTOTAL, CKMB, CKMBINDEX, TROPONINI in the last 168 hours. BNP (last 3 results) No results for input(s): PROBNP in the last 8760 hours. HbA1C: No results for input(s): HGBA1C in the last 72 hours. CBG: Recent Labs  Lab 08/13/19 1601 08/13/19 1956 08/14/19 0005 08/14/19 0346 08/14/19 1212  GLUCAP 135* 124* 105* 104* 88   Lipid Profile: No results for input(s): CHOL, HDL, LDLCALC, TRIG, CHOLHDL, LDLDIRECT in the last 72 hours. Thyroid Function Tests: No results for input(s): TSH, T4TOTAL, FREET4, T3FREE, THYROIDAB in the last 72 hours. Anemia Panel: No results for input(s): VITAMINB12, FOLATE, FERRITIN, TIBC, IRON, RETICCTPCT in the last 72 hours. Sepsis Labs: No results for input(s): PROCALCITON, LATICACIDVEN in the last 168 hours.   Recent Results (from the past 240 hour(s))  Urine culture     Status: Abnormal   Collection Time: 08/11/19  1:13 AM   Specimen: Urine, Catheterized  Result Value Ref Range Status   Specimen Description   Final    URINE, CATHETERIZED Performed at [facility], [address] [place], [place] [address]    Special Requests   Final    NONE Performed at [facility], [address] [place], [place] [address]    Culture MULTIPLE SPECIES PRESENT, SUGGEST RECOLLECTION (A)  Final   Report Status 08/12/2019 FINAL  Final  SARS CORONAVIRUS 2 (TAT 6-24 HRS) Nasopharyngeal Nasopharyngeal Swab     Status: None   Collection Time: 08/11/19  2:08 AM   Specimen: Nasopharyngeal Swab  Result Value Ref Range Status   SARS Coronavirus 2 NEGATIVE NEGATIVE Final    Comment: (NOTE) SARS-CoV-2 target nucleic acids are NOT DETECTED. The SARS-CoV-2 RNA is generally detectable in upper and lower respiratory specimens during the acute phase of infection. Negative results do not preclude SARS-CoV-2 infection, do not rule out co-infections with other pathogens, and should not be used as the sole basis for treatment or other patient management decisions. Negative results must be combined with clinical observations, patient history, and epidemiological information. The expected result is Negative. Fact Sheet for Patients: SugarRoll.be Fact Sheet for Healthcare Providers: https://www.woods-mathews.com/ This test is not yet approved or cleared by the [place] FDA and  has been authorized for detection and/or diagnosis of SARS-CoV-2 by FDA under an Emergency Use Authorization (EUA). This EUA will remain  in effect (meaning this test can be used) for the duration of the COVID-19 declaration under Section 56 4(b)(1) of the Act, 21 U.S.C. section 360bbb-3(b)(1), unless the authorization is terminated or revoked sooner. Performed at [facility], [place] [address], [place], [place] [address]   Culture, blood (Routine X 2) w Reflex to ID Panel     Status: None   Collection Time: 08/11/19  8:36 AM   Specimen: BLOOD LEFT HAND  Result Value Ref Range Status   Specimen Description BLOOD LEFT HAND  Final   Special Requests   Final    BOTTLES DRAWN AEROBIC ONLY Blood Culture results may not be optimal due to an inadequate volume of blood received in culture bottles   Culture   Final    NO GROWTH 5 DAYS Performed at [facility], [place] [address] [place],  [address]  Report Status 08/16/2019 FINAL  Final  Culture, blood (Routine X 2) w Reflex to ID Panel     Status: None   Collection Time: 08/11/19  9:30 AM   Specimen: BLOOD LEFT HAND  Result Value Ref Range Status   Specimen Description BLOOD LEFT HAND  Final   Special Requests   Final    BOTTLES DRAWN AEROBIC ONLY Blood Culture results may not be optimal due to an inadequate volume of blood received in culture bottles   Culture   Final    NO GROWTH 5 DAYS Performed at [facility], [place] [address], [place], [place] [address]    Report Status 08/16/2019 FINAL  Final  SARS Coronavirus 2 by RT PCR (hospital order, performed in [facility] hospital lab) Nasopharyngeal Nasopharyngeal Swab     Status: None   Collection Time: 08/11/19 10:23 AM   Specimen: Nasopharyngeal Swab  Result Value Ref Range Status   SARS Coronavirus 2 NEGATIVE NEGATIVE Final    Comment: (NOTE) If result is NEGATIVE SARS-CoV-2 target nucleic acids are NOT DETECTED. The SARS-CoV-2 RNA is generally detectable in upper and lower  respiratory specimens during the acute phase of infection. The lowest  concentration of SARS-CoV-2 viral copies this assay can detect is 250  copies / mL. A negative result does not preclude SARS-CoV-2 infection  and should not be used as the sole basis for treatment or other  patient management decisions.  A negative result may occur with  improper specimen  collection / handling, submission of specimen other  than nasopharyngeal swab, presence of viral mutation(s) within the  areas targeted by this assay, and inadequate number of viral copies  (<250 copies / mL). A negative result must be combined with clinical  observations, patient history, and epidemiological information. If result is POSITIVE SARS-CoV-2 target nucleic acids are DETECTED. The SARS-CoV-2 RNA is generally detectable in upper and lower  respiratory specimens dur ing the acute phase of infection.  Positive  results are indicative of active infection with SARS-CoV-2.  Clinical  correlation with patient history and other diagnostic information is  necessary to determine patient infection status.  Positive results do  not rule out bacterial infection or co-infection with other viruses. If result is PRESUMPTIVE POSTIVE SARS-CoV-2 nucleic acids MAY BE PRESENT.   A presumptive positive result was obtained on the submitted specimen  and confirmed on repeat testing.  While 2019 novel coronavirus  (SARS-CoV-2) nucleic acids may be present in the submitted sample  additional confirmatory testing may be necessary for epidemiological  and / or clinical management purposes  to differentiate between  SARS-CoV-2 and other Sarbecovirus currently known to infect humans.  If clinically indicated additional testing with an alternate test  methodology [id number]) is advised. The SARS-CoV-2 RNA is generally  detectable in upper and lower respiratory sp ecimens during the acute  phase of infection. The expected result is Negative. Fact Sheet for Patients:  StrictlyIdeas.no Fact Sheet for Healthcare Providers: BankingDealers.co.za This test is not yet approved or cleared by the [place] FDA and has been authorized for detection and/or diagnosis of SARS-CoV-2 by FDA under an Emergency Use Authorization (EUA).  This EUA will remain in effect  (meaning this test can be used) for the duration of the COVID-19 declaration under Section 564(b)(1) of the Act, 21 U.S.C. section 360bbb-3(b)(1), unless the authorization is terminated or revoked sooner. Performed at [facility], [place] [address] [place], [place] [address]   MRSA PCR Screening  Status: None   Collection Time: 08/11/19  3:01 PM   Specimen: Nasal Mucosa; Nasopharyngeal  Result Value Ref Range Status   MRSA by PCR NEGATIVE NEGATIVE Final    Comment:        The GeneXpert MRSA Assay (FDA approved for NASAL specimens only), is one component of a comprehensive MRSA colonization surveillance program. It is not intended to diagnose MRSA infection nor to guide or monitor treatment for MRSA infections. Performed at [facility], [place] [address] [place], [place] [address]          Radiology Studies: No results found.      Scheduled Meds: . carvedilol  6.25 mg Oral BID WC  . cefdinir  300 mg Oral Daily  . Chlorhexidine Gluconate Cloth  6 each Topical Daily  . enoxaparin (LOVENOX) injection  30 mg Subcutaneous Daily  . hydrALAZINE  25 mg Oral Q8H  . isosorbide mononitrate  15 mg Oral Daily  . sodium chloride flush  3 mL Intravenous Q12H   Continuous Infusions: . sodium chloride Stopped (08/14/19 1651)  . sodium chloride       LOS: 5 days   Time spent= 25 mins    Ankit Arsenio Loader, MD [facility]  If 7PM-7AM, please contact night-coverage  08/16/2019, 12:15 PM

## 2019-08-17 LAB — BASIC METABOLIC PANEL
Anion gap: 10 (ref 5–15)
BUN: 16 mg/dL (ref 8–23)
CO2: 20 mmol/L — ABNORMAL LOW (ref 22–32)
Calcium: 8.6 mg/dL — ABNORMAL LOW (ref 8.9–10.3)
Chloride: 111 mmol/L (ref 98–111)
Creatinine, Ser: 1.4 mg/dL — ABNORMAL HIGH (ref 0.44–1.00)
GFR calc Af Amer: 46 mL/min — ABNORMAL LOW (ref >60)
GFR calc non Af Amer: 39 mL/min — ABNORMAL LOW (ref >60)
Glucose, Bld: 79 mg/dL (ref 70–99)
Potassium: 4.1 mmol/L (ref 3.5–5.1)
Sodium: 141 mmol/L (ref 135–145)

## 2019-08-17 NOTE — Progress Notes (Signed)
PROGRESS NOTE    Jeanne Bruce  OHY:[id number] DOB: Jan 31, 1956 DOA: 08/11/2019 PCP: Derrill Center., MD      Brief Narrative:  Jeanne Bruce is a 63 y.o. F with MS, history of colon cancer with a colostomy, recent right nephrectomy for renal abscess (October). She is admitted with progressive weakness and then acute focal neurological signs concerning for acute CVA.      In the ER, she suffered a brief respiratory and VF arrest in the ED after receiving low-dose lorazepam as evaluation was underway. She was intubated, resuscitated with 1 round of CPR. Hypothermia protocol was deferred.HerMRI, EEG were reassuring. She has been treated with empiric antibiotics with a working diagnosis of sepsis as a precipitant of acute encephalopathy.Urine culture showed multiple species.  Cardiac medicine chest with the help of cardiology team.  Holding off on ARB/ACE inhibitor at this time.      Assessment & Plan:  Acute metabolic encephalopathy Suspect that her presenting encephalopathy was due to infection, not stroke.  MRI showed background pattern of chronic multiple sclerosis with no acute ischemic infarction.  Her encephalopathy resolved with treatment of her infection with ceftriaxone.     Sepsis due to UTI Patient developed fever and leukocytosis on hospital day 1, blood cultures negative but urine culture growing multiple species.  Started on ceftriaxone and defervesced completely.  Now completed 7 days therapy.  Urine culture not recollected. -Stop antibiotics and monitor -If she were to develop symptoms of UTI again, would re-culture and discuss with ID before starting empiric antibiotics   Acute systolic congestive heart failure, suspect Takotsubo cardiomyopathy secondary to sepsis -Continue hydralazine, Coreg, Imdur -Hold ARB due to renal function -Obtain echocardiogram at outpatient cardiology follow-up  Hypertension Pressure elevated -Continue Coreg, hydralazine, Imdur   Chronic kidney disesase IIIb Creatinine is resolved at baseline 1.4  History of multiple sclerosis  History of colon cancer status post colectomy/colostomy  History of recurrent UTI (E. 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  Medication management as above.  The patient was admitted with confusion, had respiratory arrest after lorazepam, ROSC quickly and did not need cooling.  Subequently CVA was ruled out and it was determined she had encephalopathy and cardiomyopathy from sepsis from UTI.  This was treated with ceftriaxone to cefdinir and she is improving.        DVT prophylaxis: Lovenox Code Status: FULL Family Communication:     Consultants:   Critical Care  Procedures:   10/26 Echo  IMPRESSIONS    1. Left ventricular ejection fraction, by visual estimation, is 20 to 25%. The left ventricle has severely decreased function. Normal left ventricular size. There is no left ventricular hypertrophy.  2. Definity contrast agent was given IV to delineate the left ventricular endocardial borders.  3. Left ventricular diastolic Doppler parameters are consistent with impaired relaxation pattern of LV diastolic filling.  4. LVEF is severely depressed with diffuse hypokinesis; septal and apical akinesis.  5. Global right ventricle has normal systolic function.The right ventricular size is normal. No increase in right ventricular wall thickness.  6. Left atrial size was normal.  7. Right atrial size was normal.  8. The mitral valve is grossly normal. Trace mitral valve regurgitation.  9. The tricuspid  valve is normal in structure. Tricuspid valve regurgitation is trivial. 10. The aortic valve is  normal in structure. Aortic valve regurgitation is trivial by color flow Doppler. 11. The pulmonic valve was not well visualized. Pulmonic valve regurgitation is not visualized by color flow Doppler.  10/29 Left heart cath 1. No angiographic evidence of CAD 2. Non-ischemic cardiomyopathy     Antimicrobials:   CTX/flagyl x2 days  Cefdinir 10/28 >> 11/1    Subjective: Feeling well.  No dysuria, suprapubic pain, urgency.  No fever, confusion.  Feels at baseline.  Appetite good.  Objective: Vitals:   08/16/19 1654 08/16/19 2104 08/17/19 0615 08/17/19 0832  BP: 134/69 (!) 150/86 (!) 161/72 (!) 146/67  Pulse:  97 74 94  Resp:  18 18   Temp:  97.7 F (36.5 C) 98.2 F (36.8 C)   TempSrc:  Oral Oral   SpO2:  100% 99%   Weight:      Height:        Intake/Output Summary (Last 24 hours) at 08/17/2019 1010 Last data filed at 08/17/2019 0839 Gross per 24 hour  Intake 243 ml  Output 300 ml  Net -57 ml   Filed Weights   08/11/19 1435 08/15/19 0500 08/16/19 0510  Weight: 60.4 kg 58.4 kg 58.3 kg    Examination: General appearance:  adult female, alert and in no acute distress.  Texting on her phone. HEENT: Anicteric, conjunctiva pink, lids and lashes normal. No nasal deformity, discharge, epistaxis.  Lips moist, dentition normal, oropharynx moist, no oral lesions, hearing normal.   Skin: Warm and dry.  No jaundice.  No suspicious rashes or lesions. Cardiac: RRR, nl S1-S2, no murmurs appreciated.  Capillary refill is brisk.  JVP normal.  No LE edema.  Radia  pulses 2+ and symmetric. Respiratory: Normal respiratory rate and rhythm.  CTAB without rales or wheezes. Abdomen: Abdomen soft.  No TTP or guarding. No ascites, distension, hepatosplenomegaly.   MSK: No deformities or effusions.  Normal muscle bulk and tone. Neuro: Awake and alert.  EOMI, moves all extremities with normal strength and coordination, perhaps right mild weakness. Speech fluent.    Psych: Sensorium intact and  responding to questions, attention normal. Affect normal.  Judgment and insight appear normal.    Data Reviewed: I have personally reviewed following labs and imaging studies:  CBC: Recent Labs  Lab 08/11/19 0045 08/11/19 0810  08/11/19 1515 08/12/19 0641 08/13/19 0142 08/15/19 0330 08/16/19 0443  WBC 10.5 27.3*  --   --  11.9* 9.7 7.7 7.5  NEUTROABS 6.3  --   --   --  8.9*  --   --   --   HGB 9.9* 9.6*   < > 9.9* 10.1* 9.0* 9.0* 9.0*  HCT 32.0* 30.6*   < > 29.0* 33.2* 29.4* 29.6* 29.1*  MCV 95.5 95.3  --   --  98.2 98.3 97.7 96.7  PLT 411* 458*  --   --  302 268 257 237   < > = values in this interval not displayed.   Basic Metabolic Panel: Recent Labs  Lab 08/11/19 0656  08/11/19 2046 08/12/19 0641 08/13/19 0142 08/14/19 0950 08/15/19 0330 08/16/19 0443 08/17/19 0346  NA  --    < > 138 137 139 139 139 138 141  K  --    < > 3.9 3.9 3.3* 3.8 3.3* 4.0 4.1  CL  --    < > 102 103 107 107 107 109 111  CO2  --    < >  23 24 20* 20* 21* 22 20*  GLUCOSE  --    < > 122* 120* 94 116* 98 96 79  BUN  --    < > 18 20 20 14 15 14 16   CREATININE  --    < > 1.90* 1.97* 1.76* 1.50* 1.49* 1.46* 1.40*  CALCIUM  --    < > 8.5* 8.4* 8.3* 8.5* 8.3* 8.4* 8.6*  MG 2.6*  --  2.4 2.3  --   --   --   --   --    < > = values in this interval not displayed.   GFR: Estimated Creatinine Clearance: 31.7 mL/min (A) (by C-G formula based on SCr of 1.4 mg/dL (H)). Liver Function Tests: Recent Labs  Lab 08/11/19 0045 08/11/19 0810 08/13/19 0142  AST 45* 48* 13*  ALT 57* 59* 27  ALKPHOS 101 93 86  BILITOT 0.8 0.4 0.3  PROT 6.6 6.0* 5.4*  ALBUMIN 3.4* 3.0* 2.6*   No results for input(s): LIPASE, AMYLASE in the last 168 hours. No results for input(s): AMMONIA in the last 168 hours. Coagulation Profile: Recent Labs  Lab 08/11/19 0045  INR 1.1   Cardiac Enzymes: No results for input(s): CKTOTAL, CKMB, CKMBINDEX, TROPONINI in the last 168 hours. BNP (last 3 results) No results for  input(s): PROBNP in the last 8760 hours. HbA1C: No results for input(s): HGBA1C in the last 72 hours. CBG: Recent Labs  Lab 08/13/19 1601 08/13/19 1956 08/14/19 0005 08/14/19 0346 08/14/19 1212  GLUCAP 135* 124* 105* 104* 88   Lipid Profile: No results for input(s): CHOL, HDL, LDLCALC, TRIG, CHOLHDL, LDLDIRECT in the last 72 hours. Thyroid Function Tests: No results for input(s): TSH, T4TOTAL, FREET4, T3FREE, THYROIDAB in the last 72 hours. Anemia Panel: No results for input(s): VITAMINB12, FOLATE, FERRITIN, TIBC, IRON, RETICCTPCT in the last 72 hours. Urine analysis:    Component Value Date/Time   COLORURINE STRAW (A) 08/11/2019 0113   APPEARANCEUR HAZY (A) 08/11/2019 0113   LABSPEC 1.015 08/11/2019 0113   PHURINE 8.0 08/11/2019 0113   GLUCOSEU NEGATIVE 08/11/2019 0113   HGBUR TRACE (A) 08/11/2019 0113   BILIRUBINUR NEGATIVE 08/11/2019 0113   KETONESUR 15 (A) 08/11/2019 0113   PROTEINUR 100 (A) 08/11/2019 0113   NITRITE NEGATIVE 08/11/2019 0113   LEUKOCYTESUR SMALL (A) 08/11/2019 0113   Sepsis Labs: @LABRCNTIP (procalcitonin:4,lacticacidven:4)  ) Recent Results (from the past 240 hour(s))  Urine culture     Status: Abnormal   Collection Time: 08/11/19  1:13 AM   Specimen: Urine, Catheterized  Result Value Ref Range Status   Specimen Description   Final    URINE, CATHETERIZED Performed at [facility], [address] [place], [place] [address]    Special Requests   Final    NONE Performed at [facility], [address] [place], [place] [address]    Culture MULTIPLE SPECIES PRESENT, SUGGEST RECOLLECTION (A)  Final   Report Status 08/12/2019 FINAL  Final  SARS CORONAVIRUS 2 (TAT 6-24 HRS) Nasopharyngeal Nasopharyngeal Swab     Status: None   Collection Time: 08/11/19  2:08 AM   Specimen: Nasopharyngeal Swab  Result Value Ref Range Status   SARS Coronavirus 2 NEGATIVE NEGATIVE Final    Comment: (NOTE) SARS-CoV-2 target nucleic acids are  NOT DETECTED. The SARS-CoV-2 RNA is generally detectable in upper and lower respiratory specimens during the acute phase of infection. Negative results do not preclude SARS-CoV-2 infection, do not rule out co-infections with  other pathogens, and should not be used as the sole basis for treatment or other patient management decisions. Negative results must be combined with clinical observations, patient history, and epidemiological information. The expected result is Negative. Fact Sheet for Patients: SugarRoll.be Fact Sheet for Healthcare Providers: https://www.woods-mathews.com/ This test is not yet approved or cleared by the [place] FDA and  has been authorized for detection and/or diagnosis of SARS-CoV-2 by FDA under an Emergency Use Authorization (EUA). This EUA will remain  in effect (meaning this test can be used) for the duration of the COVID-19 declaration under Section 56 4(b)(1) of the Act, 21 U.S.C. section 360bbb-3(b)(1), unless the authorization is terminated or revoked sooner. Performed at [facility], [place] [address] [place], [place] [address]   Culture, blood (Routine X 2) w Reflex to ID Panel     Status: None   Collection Time: 08/11/19  8:36 AM   Specimen: BLOOD LEFT HAND  Result Value Ref Range Status   Specimen Description BLOOD LEFT HAND  Final   Special Requests   Final    BOTTLES DRAWN AEROBIC ONLY Blood Culture results may not be optimal due to an inadequate volume of blood received in culture bottles   Culture   Final    NO GROWTH 5 DAYS Performed at [facility], [place] [address], [place], [place] [address]    Report Status 08/16/2019 FINAL  Final  Culture, blood (Routine X 2) w Reflex to ID Panel     Status: None   Collection Time: 08/11/19  9:30 AM   Specimen: BLOOD LEFT HAND  Result Value Ref Range Status   Specimen Description BLOOD LEFT HAND  Final   Special Requests   Final    BOTTLES  DRAWN AEROBIC ONLY Blood Culture results may not be optimal due to an inadequate volume of blood received in culture bottles   Culture   Final    NO GROWTH 5 DAYS Performed at [facility], [place] [address], [place], [place] [address]    Report Status 08/16/2019 FINAL  Final  SARS Coronavirus 2 by RT PCR (hospital order, performed in [facility] hospital lab) Nasopharyngeal Nasopharyngeal Swab     Status: None   Collection Time: 08/11/19 10:23 AM   Specimen: Nasopharyngeal Swab  Result Value Ref Range Status   SARS Coronavirus 2 NEGATIVE NEGATIVE Final    Comment: (NOTE) If result is NEGATIVE SARS-CoV-2 target nucleic acids are NOT DETECTED. The SARS-CoV-2 RNA is generally detectable in upper and lower  respiratory specimens during the acute phase of infection. The lowest  concentration of SARS-CoV-2 viral copies this assay can detect is 250  copies / mL. A negative result does not preclude SARS-CoV-2 infection  and should not be used as the sole basis for treatment or other  patient management decisions.  A negative result may occur with  improper specimen collection / handling, submission of specimen other  than nasopharyngeal swab, presence of viral mutation(s) within the  areas targeted by this assay, and inadequate number of viral copies  (<250 copies / mL). A negative result must be combined with clinical  observations, patient history, and epidemiological information. If result is POSITIVE SARS-CoV-2 target nucleic acids are DETECTED. The SARS-CoV-2 RNA is generally detectable in upper and lower  respiratory specimens dur ing the acute phase of infection.  Positive  results are indicative of active infection with SARS-CoV-2.  Clinical  correlation with patient history and other diagnostic information is  necessary to determine patient infection status.  Positive results do  not rule out bacterial infection or co-infection with other viruses. If result is PRESUMPTIVE  POSTIVE SARS-CoV-2 nucleic acids MAY BE PRESENT.   A presumptive positive result was obtained on the submitted specimen  and confirmed on repeat testing.  While 2019 novel coronavirus  (SARS-CoV-2) nucleic acids may be present in the submitted sample  additional confirmatory testing may be necessary for epidemiological  and / or clinical management purposes  to differentiate between  SARS-CoV-2 and other Sarbecovirus currently known to infect humans.  If clinically indicated additional testing with an alternate test  methodology [phone]) is advised. The SARS-CoV-2 RNA is generally  detectable in upper and lower respiratory sp ecimens during the acute  phase of infection. The expected result is Negative. Fact Sheet for Patients:  StrictlyIdeas.no Fact Sheet for Healthcare Providers: BankingDealers.co.za This test is not yet approved or cleared by the [place] FDA and has been authorized for detection and/or diagnosis of SARS-CoV-2 by FDA under an Emergency Use Authorization (EUA).  This EUA will remain in effect (meaning this test can be used) for the duration of the COVID-19 declaration under Section 564(b)(1) of the Act, 21 U.S.C. section 360bbb-3(b)(1), unless the authorization is terminated or revoked sooner. Performed at [facility], [place] [address] [place], [place] [address]   MRSA PCR Screening     Status: None   Collection Time: 08/11/19  3:01 PM   Specimen: Nasal Mucosa; Nasopharyngeal  Result Value Ref Range Status   MRSA by PCR NEGATIVE NEGATIVE Final    Comment:        The GeneXpert MRSA Assay (FDA approved for NASAL specimens only), is one component of a comprehensive MRSA colonization surveillance program. It is not intended to diagnose MRSA infection nor to guide or monitor treatment for MRSA infections. Performed at [facility], [place] [address], [place], [place] [address]           Radiology Studies: No results found.      Scheduled Meds: . carvedilol  6.25 mg Oral BID WC  . cefdinir  300 mg Oral Daily  . enoxaparin (LOVENOX) injection  30 mg Subcutaneous Daily  . hydrALAZINE  25 mg Oral Q8H  . isosorbide mononitrate  15 mg Oral Daily  . sodium chloride flush  3 mL Intravenous Q12H   Continuous Infusions: . sodium chloride Stopped (08/14/19 1651)  . sodium chloride       LOS: 6 days    Time spent: 25 minutes    Edwin Dada, MD [facility] 08/17/2019, 10:10 AM     Please page through AMION:  [email or web address] Password [email or web address] If 7PM-7AM, please contact night-coverage

## 2019-08-18 DIAGNOSIS — G35 Multiple sclerosis: Secondary | ICD-10-CM

## 2019-08-18 DIAGNOSIS — N289 Disorder of kidney and ureter, unspecified: Secondary | ICD-10-CM

## 2019-08-18 LAB — BASIC METABOLIC PANEL
Anion gap: 9 (ref 5–15)
BUN: 16 mg/dL (ref 8–23)
CO2: 22 mmol/L (ref 22–32)
Calcium: 9 mg/dL (ref 8.9–10.3)
Chloride: 110 mmol/L (ref 98–111)
Creatinine, Ser: 1.51 mg/dL — ABNORMAL HIGH (ref 0.44–1.00)
GFR calc Af Amer: 42 mL/min — ABNORMAL LOW (ref >60)
GFR calc non Af Amer: 36 mL/min — ABNORMAL LOW (ref >60)
Glucose, Bld: 81 mg/dL (ref 70–99)
Potassium: 3.4 mmol/L — ABNORMAL LOW (ref 3.5–5.1)
Sodium: 141 mmol/L (ref 135–145)

## 2019-08-18 MED ORDER — ENSURE ENLIVE PO LIQD: 237.0000 mL | Freq: Two times a day (BID) | ORAL | Status: DC

## 2019-08-18 MED ORDER — CEFDINIR 300 MG PO CAPS
300.0000 mg | ORAL_CAPSULE | Freq: Two times a day (BID) | ORAL | Status: DC
  Administered 2019-08-18 – 2019-08-20 (×5): 300 mg via ORAL
  Filled 2019-08-18 (×5): qty 1

## 2019-08-18 NOTE — Progress Notes (Signed)
PROGRESS NOTE    Jeanne Bruce  LDJ:[id number] DOB: [birth year] DOA: 08/11/2019 PCP: Derrill Center., MD      Brief Narrative:  Jeanne Bruce is a 63 y.o. F with MS, history of colon cancer with a colostomy, recent right nephrectomy for renal abscess (October). She is admitted with progressive weakness and then acute focal neurological signs concerning for acute CVA.      In the ER, she suffered a brief respiratory and VF arrest in the ED after receiving low-dose lorazepam as evaluation was underway. She was intubated, resuscitated with 1 round of CPR. Hypothermia protocol was deferred.HerMRI, EEG were reassuring. She has been treated with empiric antibiotics with a working diagnosis of sepsis as a precipitant of acute encephalopathy.Urine culture showed multiple species.  Cardiac medicine chest with the help of cardiology team.  Holding off on ARB/ACE inhibitor at this time.      Assessment & Plan:  Acute metabolic encephalopathy Suspect that her presenting encephalopathy was due to infection, not stroke.  MRI showed background pattern of chronic multiple sclerosis with no acute ischemic infarction.  Her encephalopathy resolved with treatment of her infection with ceftriaxone.     Sepsis due to UTI History of recurrent UTI (E. 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   She then developed fever and leukocytosis on hospital day 2, blood cultures negative but urine culture growing multiple species.  Started on ceftriaxone and defervesced completely.  Urine culture with multiple species and repeat Urine culture not recollected.  Stopped antibiotics 24 hours ago, but now I see that her urine character is very cloudy in the purewick cannister, and I favor to continue cefdinir  (which she responded to well) until she sees her ID specialist (at [facility]) one week from tomorrow. -Continue cefdinir 300 BID until she sees ID in 1 week -Repeat urine culture -Patient has follow up with ID at [facility] in 1 week    Acute systolic congestive heart failure, suspect Takotsubo cardiomyopathy secondary to sepsis Patient noted here to be fluid overloaded.  Echocardiogram showed EF that was reduced.  Cardiology consulted, diagnosed Takotsubo.    Now appears euvolemic, no dyspnea. -Continue hydralazine, Coreg, Imdur -Hold ARB due to renal function -Obtain echocardiogram at outpatient cardiology follow-up  Hypertension BP high normal -Continue Coreg, hydralazine, Imdur  Chronic kidney disesase IIIb Creatinine is resolved at baseline 1.4-1.5  History of multiple sclerosis  History of colon cancer status post colectomy/colostomy  Stage II pressure injury sacrum POA   Mood  -Hold escitalopram        MDM and disposition: The below labs and imaging reports reviewed and summarized above.  Medication management as above.   The patient was admitted with confusion, had respiratory arrest after lorazepam, ROSC quickly and did not need cooling.  Subequently CVA was ruled out and it was determined she had encephalopathy and cardiomyopathy from sepsis from UTI.  This was treated with ceftriaxone to cefdinir and she is improving but she remains significantly debilitated from her infection and will require substantial help for self cares to regain her previous independent level of function.       DVT prophylaxis: Lovenox Code Status: FULL Family Communication: Daughter and husband    Consultants:   Critical Care  Procedures:   10/26 Echo  IMPRESSIONS    1. Left ventricular ejection fraction, by  visual estimation, is 20 to 25%. The left ventricle has severely decreased function. Normal left ventricular size. There is no left ventricular hypertrophy.  2. Definity  contrast agent was given IV to delineate the left ventricular endocardial borders.  3. Left ventricular diastolic Doppler parameters are consistent with impaired relaxation pattern of LV diastolic filling.  4. LVEF is severely depressed with diffuse hypokinesis; septal and apical akinesis.  5. Global right ventricle has normal systolic function.The right ventricular size is normal. No increase in right ventricular wall thickness.  6. Left atrial size was normal.  7. Right atrial size was normal.  8. The mitral valve is grossly normal. Trace mitral valve regurgitation.  9. The tricuspid valve is normal in structure. Tricuspid valve regurgitation is trivial. 10. The aortic valve is normal in structure. Aortic valve regurgitation is trivial by color flow Doppler. 11. The pulmonic valve was not well visualized. Pulmonic valve regurgitation is not visualized by color flow Doppler.  10/29 Left heart cath 1. No angiographic evidence of CAD 2. Non-ischemic cardiomyopathy     Antimicrobials:   CTX/flagyl x2 days  Cefdinir 10/28 >>   Subjective: No new fever, suprapubic pain, confusion, vomiting, chest pain, flank pain.  She is generally extremely weak.  Objective: Vitals:   08/17/19 2133 08/18/19 0508 08/18/19 0541 08/18/19 1507  BP: 128/60 (!) 166/81 (!) 159/68 (!) 141/65  Pulse:  86    Resp:  18    Temp:  98.6 F (37 C)    TempSrc:  Oral    SpO2:  100%    Weight:      Height:        Intake/Output Summary (Last 24 hours) at 08/18/2019 1958 Last data filed at 08/18/2019 0542 Gross per 24 hour  Intake 153 ml  Output -  Net 153 ml   Filed Weights   08/11/19 1435 08/15/19 0500 08/16/19 0510  Weight: 60.4 kg 58.4 kg 58.3 kg    Examination: General appearance: Adult female, lying in bed, appears debilitated HEENT: Anicteric, conjunctiva pink, lids and lashes normal. No nasal deformity, discharge, epistaxis.  Lips moist, dentition normal, oropharynx moist, no oral lesions,  hearing normal.   Skin: Warm and dry.  No jaundice.  No suspicious rashes or lesions. Cardiac: Regular rate and rhythm, no murmurs appreciated, JVP normal, no lower extremity edema Respiratory: Normal respiratory rate and rhythm, lungs clear without rales or wheezes Abdomen: Abdomen soft, no guarding or rebound, or rigidity, but some mild nonfocal tenderness palpation, no ascites or distention. MSK: No deformities or effusions.  Normal muscle bulk and tone. Neuro: Awake and alert, extraocular movements intact, moves all extremities with severe generalized weakness and coordination, speech fluent.    Psych: Sensorium intact responding to questions, attention normal, affect normal, judgment insight appear normal.    Data Reviewed: I have personally reviewed following labs and imaging studies:  CBC: Recent Labs  Lab 08/12/19 0641 08/13/19 0142 08/15/19 0330 08/16/19 0443  WBC 11.9* 9.7 7.7 7.5  NEUTROABS 8.9*  --   --   --   HGB 10.1* 9.0* 9.0* 9.0*  HCT 33.2* 29.4* 29.6* 29.1*  MCV 98.2 98.3 97.7 96.7  PLT 302 268 257 932   Basic Metabolic Panel: Recent Labs  Lab 08/12/19 0641  08/14/19 0950 08/15/19 0330 08/16/19 0443 08/17/19 0346 08/18/19 0422  NA 137   < > 139 139 138 141 141  K 3.9   < > 3.8 3.3* 4.0 4.1 3.4*  CL 103   < >  107 107 109 111 110  CO2 24   < > 20* 21* 22 20* 22  GLUCOSE 120*   < > 116* 98 96 79 81  BUN 20   < > 14 15 14 16 16   CREATININE 1.97*   < > 1.50* 1.49* 1.46* 1.40* 1.51*  CALCIUM 8.4*   < > 8.5* 8.3* 8.4* 8.6* 9.0  MG 2.3  --   --   --   --   --   --    < > = values in this interval not displayed.   GFR: Estimated Creatinine Clearance: 29.4 mL/min (A) (by C-G formula based on SCr of 1.51 mg/dL (H)). Liver Function Tests: Recent Labs  Lab 08/13/19 0142  AST 13*  ALT 27  ALKPHOS 86  BILITOT 0.3  PROT 5.4*  ALBUMIN 2.6*   No results for input(s): LIPASE, AMYLASE in the last 168 hours. No results for input(s): AMMONIA in the last 168  hours. Coagulation Profile: No results for input(s): INR, PROTIME in the last 168 hours. Cardiac Enzymes: No results for input(s): CKTOTAL, CKMB, CKMBINDEX, TROPONINI in the last 168 hours. BNP (last 3 results) No results for input(s): PROBNP in the last 8760 hours. HbA1C: No results for input(s): HGBA1C in the last 72 hours. CBG: Recent Labs  Lab 08/13/19 1601 08/13/19 1956 08/14/19 0005 08/14/19 0346 08/14/19 1212  GLUCAP 135* 124* 105* 104* 88   Lipid Profile: No results for input(s): CHOL, HDL, LDLCALC, TRIG, CHOLHDL, LDLDIRECT in the last 72 hours. Thyroid Function Tests: No results for input(s): TSH, T4TOTAL, FREET4, T3FREE, THYROIDAB in the last 72 hours. Anemia Panel: No results for input(s): VITAMINB12, FOLATE, FERRITIN, TIBC, IRON, RETICCTPCT in the last 72 hours. Urine analysis:    Component Value Date/Time   COLORURINE STRAW (A) 08/11/2019 0113   APPEARANCEUR HAZY (A) 08/11/2019 0113   LABSPEC 1.015 08/11/2019 0113   PHURINE 8.0 08/11/2019 0113   GLUCOSEU NEGATIVE 08/11/2019 0113   HGBUR TRACE (A) 08/11/2019 0113   BILIRUBINUR NEGATIVE 08/11/2019 0113   KETONESUR 15 (A) 08/11/2019 0113   PROTEINUR 100 (A) 08/11/2019 0113   NITRITE NEGATIVE 08/11/2019 0113   LEUKOCYTESUR SMALL (A) 08/11/2019 0113   Sepsis Labs: @LABRCNTIP (procalcitonin:4,lacticacidven:4)  ) Recent Results (from the past 240 hour(s))  Urine culture     Status: Abnormal   Collection Time: 08/11/19  1:13 AM   Specimen: Urine, Catheterized  Result Value Ref Range Status   Specimen Description   Final    URINE, CATHETERIZED Performed at [facility], [address] [place], [place] [address]    Special Requests   Final    NONE Performed at [facility], [address] [place], [place] [address]    Culture MULTIPLE SPECIES PRESENT, SUGGEST RECOLLECTION (A)  Final   Report Status 08/12/2019 FINAL  Final  SARS CORONAVIRUS 2 (TAT 6-24 HRS) Nasopharyngeal  Nasopharyngeal Swab     Status: None   Collection Time: 08/11/19  2:08 AM   Specimen: Nasopharyngeal Swab  Result Value Ref Range Status   SARS Coronavirus 2 NEGATIVE NEGATIVE Final    Comment: (NOTE) SARS-CoV-2 target nucleic acids are NOT DETECTED. The SARS-CoV-2 RNA is generally detectable in upper and lower respiratory specimens during the acute phase of infection. Negative results do not preclude SARS-CoV-2 infection, do not rule out co-infections with other pathogens, and should not be used as the sole basis for treatment or other patient management decisions. Negative results must be combined with  clinical observations, patient history, and epidemiological information. The expected result is Negative. Fact Sheet for Patients: SugarRoll.be Fact Sheet for Healthcare Providers: https://www.woods-mathews.com/ This test is not yet approved or cleared by the [place] FDA and  has been authorized for detection and/or diagnosis of SARS-CoV-2 by FDA under an Emergency Use Authorization (EUA). This EUA will remain  in effect (meaning this test can be used) for the duration of the COVID-19 declaration under Section 56 4(b)(1) of the Act, 21 U.S.C. section 360bbb-3(b)(1), unless the authorization is terminated or revoked sooner. Performed at [facility], [place] [address] [place], [place] [address]   Culture, blood (Routine X 2) w Reflex to ID Panel     Status: None   Collection Time: 08/11/19  8:36 AM   Specimen: BLOOD LEFT HAND  Result Value Ref Range Status   Specimen Description BLOOD LEFT HAND  Final   Special Requests   Final    BOTTLES DRAWN AEROBIC ONLY Blood Culture results may not be optimal due to an inadequate volume of blood received in culture bottles   Culture   Final    NO GROWTH 5 DAYS Performed at [facility], [place] [address] [place], [place] [address]    Report Status 08/16/2019 FINAL  Final  Culture,  blood (Routine X 2) w Reflex to ID Panel     Status: None   Collection Time: 08/11/19  9:30 AM   Specimen: BLOOD LEFT HAND  Result Value Ref Range Status   Specimen Description BLOOD LEFT HAND  Final   Special Requests   Final    BOTTLES DRAWN AEROBIC ONLY Blood Culture results may not be optimal due to an inadequate volume of blood received in culture bottles   Culture   Final    NO GROWTH 5 DAYS Performed at [facility], [place] [address] [place], [place] [address]    Report Status 08/16/2019 FINAL  Final  SARS Coronavirus 2 by RT PCR (hospital order, performed in [facility] hospital lab) Nasopharyngeal Nasopharyngeal Swab     Status: None   Collection Time: 08/11/19 10:23 AM   Specimen: Nasopharyngeal Swab  Result Value Ref Range Status   SARS Coronavirus 2 NEGATIVE NEGATIVE Final    Comment: (NOTE) If result is NEGATIVE SARS-CoV-2 target nucleic acids are NOT DETECTED. The SARS-CoV-2 RNA is generally detectable in upper and lower  respiratory specimens during the acute phase of infection. The lowest  concentration of SARS-CoV-2 viral copies this assay can detect is 250  copies / mL. A negative result does not preclude SARS-CoV-2 infection  and should not be used as the sole basis for treatment or other  patient management decisions.  A negative result may occur with  improper specimen collection / handling, submission of specimen other  than nasopharyngeal swab, presence of viral mutation(s) within the  areas targeted by this assay, and inadequate number of viral copies  (<250 copies / mL). A negative result must be combined with clinical  observations, patient history, and epidemiological information. If result is POSITIVE SARS-CoV-2 target nucleic acids are DETECTED. The SARS-CoV-2 RNA is generally detectable in upper and lower  respiratory specimens dur ing the acute phase of infection.  Positive  results are indicative of active infection with SARS-CoV-2.  Clinical   correlation with patient history and other diagnostic information is  necessary to determine patient infection status.  Positive results do  not rule out bacterial infection or co-infection with other viruses. If  result is PRESUMPTIVE POSTIVE SARS-CoV-2 nucleic acids MAY BE PRESENT.   A presumptive positive result was obtained on the submitted specimen  and confirmed on repeat testing.  While 2019 novel coronavirus  (SARS-CoV-2) nucleic acids may be present in the submitted sample  additional confirmatory testing may be necessary for epidemiological  and / or clinical management purposes  to differentiate between  SARS-CoV-2 and other Sarbecovirus currently known to infect humans.  If clinically indicated additional testing with an alternate test  methodology [id number]) is advised. The SARS-CoV-2 RNA is generally  detectable in upper and lower respiratory sp ecimens during the acute  phase of infection. The expected result is Negative. Fact Sheet for Patients:  StrictlyIdeas.no Fact Sheet for Healthcare Providers: BankingDealers.co.za This test is not yet approved or cleared by the [place] FDA and has been authorized for detection and/or diagnosis of SARS-CoV-2 by FDA under an Emergency Use Authorization (EUA).  This EUA will remain in effect (meaning this test can be used) for the duration of the COVID-19 declaration under Section 564(b)(1) of the Act, 21 U.S.C. section 360bbb-3(b)(1), unless the authorization is terminated or revoked sooner. Performed at [facility], [place] [address], [place], [place] [address]   MRSA PCR Screening     Status: None   Collection Time: 08/11/19  3:01 PM   Specimen: Nasal Mucosa; Nasopharyngeal  Result Value Ref Range Status   MRSA by PCR NEGATIVE NEGATIVE Final    Comment:        The GeneXpert MRSA Assay (FDA approved for NASAL specimens only), is one component of a comprehensive MRSA  colonization surveillance program. It is not intended to diagnose MRSA infection nor to guide or monitor treatment for MRSA infections. Performed at [facility], [place] [address], [place], [place] [address]          Radiology Studies: No results found.      Scheduled Meds: . carvedilol  6.25 mg Oral BID WC  . cefdinir  300 mg Oral Q12H  . enoxaparin (LOVENOX) injection  30 mg Subcutaneous Daily  . [START ON 08/19/2019] feeding supplement (ENSURE ENLIVE)  237 mL Oral BID BM  . hydrALAZINE  25 mg Oral Q8H  . isosorbide mononitrate  15 mg Oral Daily  . sodium chloride flush  3 mL Intravenous Q12H   Continuous Infusions: . sodium chloride Stopped (08/14/19 1651)  . sodium chloride       LOS: 7 days    Time spent: 25 minutes    Edwin Dada, MD [facility] 08/18/2019, 7:58 PM     Please page through [place]:  [email or web address] Password [email or web address] If 7PM-7AM, please contact night-coverage

## 2019-08-18 NOTE — Consult Note (Signed)
Inpatient Rehabilitation Admissions Coordinator  Inpatient rehab consult received. I met with patient with her spouse at bedside . We discussed goals and expectations of an inpt rehab admit. Pt previously received [facility] and [facility] in the past. Spouse refers an inpt rehab admit prior to d/c home. I will begin insurance approval with [employer]. I will follow up tomorrow after decision made my insurance.  Danne Baxter, RN, MSN Rehab Admissions Coordinator [phone] 08/18/2019 1:32 PM

## 2019-08-18 NOTE — Progress Notes (Addendum)
Physical Therapy Treatment Patient Details Name: Jeanne Bruce MRN: [id number] DOB: 05-26-1956 Today's Date: 08/18/2019    History of Present Illness 63 year old woman with a history of MS, colon cancer with colostomy, recent right nephrectomy for renal abscess (discharged mid October).  Presented with progressive rt-sided weakness, lethargy and then acute focal neurological signs concerning for possible stroke.  She had a paradoxical reaction to low-dose lorazepam, resulted in respiratory depression and a VF arrest.  She underwent brief CPR, required intubation mechanical ventilation. MRI brain does not show any evidence of CVA. EEG without any evidence of seizure activity; echocardiogram 10/26 that showed a new drop in EF 20-25% with septal and apical akinesis and evidence for diastolic dysfunction. Severe sepsis with encephalopathy    PT Comments    Pt with near syncope episode with amb with HR dropping to 38. Able to transfer pt from chair back to bed with HR incr to 100 after returning to supine. Nurse present.    Follow Up Recommendations  CIR;Supervision/Assistance - 24 hour     Equipment Recommendations  None recommended by PT    Recommendations for Other Services       Precautions / Restrictions Precautions Precautions: Fall Precaution Comments: ostomy L abdomen/ bandage R side of abdomen Restrictions Weight Bearing Restrictions: No    Mobility  Bed Mobility Overal bed mobility: Needs Assistance Bed Mobility: Supine to Sit;Sit to Supine     Supine to sit: Min assist;HOB elevated Sit to supine: Mod assist(due to near syncope)   General bed mobility comments: Assist to elevate trunk into sitting. Assist to lower trunk and bring legs into bed returning to supine due to near syncope  Transfers Overall transfer level: Needs assistance Equipment used: Rolling walker (2 wheeled) Transfers: Sit to/from [facility] Sit to Stand: Min assist Stand pivot  transfers: Min assist;Mod assist       General transfer comment: Assist for balance and verbal cues for hand placement. Bed to bsc with min assist with hand held. Mod assist chair to bed with near syncopal episode.  Ambulation/Gait Ambulation/Gait assistance: Min assist Gait Distance (Feet): 125 Feet Assistive device: Rolling walker (2 wheeled) Gait Pattern/deviations: Step-through pattern;Decreased stride length;Trendelenburg;Decreased dorsiflexion - left Gait velocity: decr Gait velocity interpretation: <1.31 ft/sec, indicative of household ambulator General Gait Details: Assist for balance and support. As finishing ambulation pt became nauseous. Sat in chair closest to door and pt became hot and near syncopal. HR down to 38. Chair pulled to bed and pt assisted back to bed.    Stairs             Wheelchair Mobility    Modified Rankin (Stroke Patients Only)       Balance Overall balance assessment: Needs assistance Sitting-balance support: No upper extremity supported;Feet supported Sitting balance-Leahy Scale: Good     Standing balance support: Single extremity supported Standing balance-Leahy Scale: Poor Standing balance comment: walker and min guard for static standing                            Cognition Arousal/Alertness: Awake/alert Behavior During Therapy: WFL for tasks assessed/performed Overall Cognitive Status: Within Functional Limits for tasks assessed                                        Exercises      General Comments  Pertinent Vitals/Pain Pain Assessment: No/denies pain    Home Living                      Prior Function            PT Goals (current goals can now be found in the care plan section) Progress towards PT goals: Progressing toward goals    Frequency    Min 3X/week      PT Plan Current plan remains appropriate    Co-evaluation              AM-PAC PT "6 Clicks"  Mobility   Outcome Measure  Help needed turning from your back to your side while in a flat bed without using bedrails?: A Little Help needed moving from lying on your back to sitting on the side of a flat bed without using bedrails?: A Little Help needed moving to and from a bed to a chair (including a wheelchair)?: A Little Help needed standing up from a chair using your arms (e.g., wheelchair or bedside chair)?: A Little Help needed to walk in hospital room?: A Little Help needed climbing 3-5 steps with a railing? : A Lot 6 Click Score: 17    End of Session Equipment Utilized During Treatment: Gait belt Activity Tolerance: Treatment limited secondary to medical complications (Comment)(bradycardia) Patient left: with call bell/phone within reach;in bed;with nursing/sitter in room Nurse Communication: Mobility status;Other (comment)(bradycardia) PT Visit Diagnosis: Other abnormalities of gait and mobility (R26.89);Muscle weakness (generalized) (M62.81);Other symptoms and signs involving the nervous system (E09.233)     Time: 0076-2263 PT Time Calculation (min) (ACUTE ONLY): 31 min  Charges:  $Gait Training: 23-37 mins                     [place] Pager [phone] [place] 08/18/2019, 12:02 PM

## 2019-08-18 NOTE — Progress Notes (Signed)
Initial Nutrition Assessment  INTERVENTION:   -Ensure Enlive po BID, each supplement provides 350 kcal and 20 grams of protein  NUTRITION DIAGNOSIS:   Increased nutrient needs related to wound healing as evidenced by estimated needs.  GOAL:   Patient will meet greater than or equal to 90% of their needs  MONITOR:   PO intake, Supplement acceptance, Labs, Weight trends, I & O's, Skin  REASON FOR ASSESSMENT:   Malnutrition Screening Tool   ASSESSMENT:   63 year old woman with MS, history of colon cancer with a colostomy, recent right nephrectomy for renal abscess (October).  She is admitted with progressive weakness and then acute focal neurological signs concerning for acute CVA.  She then suffered a brief respiratory and VF arrest in the ED after receiving low-dose lorazepam as evaluation was underway.  She was intubated, resuscitated with 1 round of CPR.  Hypothermia protocol was deferred. Her MRI, EEG were reassuring.  She has been treated with empiric antibiotics with a working diagnosis of sepsis as a precipitant of acute encephalopathy.  10/26: admitted, intubated 10/27: extubated  **RD working remotely**  Patient has been consuming 50-100% of meals since diet was advanced following extubation. Has good appetite. Will order Ensure supplements given stage 2 sacral pressure injury.  Per chart review, pt may discharge to CIR.  Admission weight: 131 lbs. Current weight 128 lbs.  Per weight records in care everywhere, weights fluctuated between 130 to 150 lbs from July to October 2020.  I/Os: +2.7L since admit UOP: 250 ml x 24 hrs  Medications reviewed. Labs reviewed: Low K  NUTRITION - FOCUSED PHYSICAL EXAM:  Unable to perform -working remotely.  Diet Order:   Diet Order            Diet Heart Room service appropriate? Yes; Fluid consistency: Thin  Diet effective now              EDUCATION NEEDS:   No education needs have been identified at this  time  Skin:  Skin Assessment: Skin Integrity Issues: Skin Integrity Issues:: Stage II Stage II: sacrum  Last BM:  11/1  Height:   Ht Readings from Last 1 Encounters:  08/11/19 5\' 2"  (1.575 m)    Weight:   Wt Readings from Last 1 Encounters:  08/16/19 58.3 kg    Ideal Body Weight:  50 kg  BMI:  Body mass index is 23.52 kg/m.  Estimated Nutritional Needs:   Kcal:  1500-1700  Protein:  70-80g  Fluid:  1.7L/day   Clayton Bibles, MS, RD, LDN Inpatient Clinical Dietitian Pager: [phone] After Hours Pager: [phone]

## 2019-08-18 NOTE — TOC Initial Note (Signed)
Transition of Care [facility]) - Initial/Assessment Note    Patient Details  Name: Jeanne Bruce MRN: [id number] Date of Birth: 11-19-1955  [facility] CM/SW Contact:    Bethena Roys, RN Phone Number: 08/18/2019, 10:37 AM  Clinical Narrative: Pt presented for progressive weakness- encephalopathy. PTA from home with husband that works. Patient states that she has an aide that comes in 4 days a week from 8:30 am-2:00 pm. Pt has DME Cane, RW and WC in the home. Patient states husband drives to all appointments and she gets her medications from [facility]. PT recommendations for [place] Admissions Coordinator to speak with patient regarding CIR. CM will continue to follow for transition of care needs.                   Expected Discharge Plan: IP Rehab Facility Barriers to Discharge: No Barriers Identified   Patient Goals and CMS Choice     Choice offered to / list presented to : NA  Expected Discharge Plan and Services Expected Discharge Plan: [place] In-house Referral: NA Discharge Planning Services: CM Consult Post Acute Care Choice: NA Living arrangements for the past 2 months: Single Family Home                   Prior Living Arrangements/Services Living arrangements for the past 2 months: Single Family Home Lives with:: Spouse Patient language and need for interpreter reviewed:: Yes Do you feel safe going back to the place where you live?: Yes      Need for Family Participation in Patient Care: Yes (Comment) Care giver support system in place?: Yes (comment) Current home services: Homehealth aide(Personal care aide- paid out of pocket.) Criminal Activity/Legal Involvement Pertinent to Current Situation/Hospitalization: No - Comment as needed  Activities of Daily Living Home Assistive Devices/Equipment: Wheelchair, Environmental consultant (specify type), Grab bars in shower ADL Screening (condition at time of admission) Patient's cognitive ability  adequate to safely complete daily activities?: Yes Is the patient deaf or have difficulty hearing?: No Does the patient have difficulty seeing, even when wearing glasses/contacts?: Yes Does the patient have difficulty concentrating, remembering, or making decisions?: No Patient able to express need for assistance with ADLs?: Yes Does the patient have difficulty dressing or bathing?: Yes Independently performs ADLs?: No Communication: Independent Dressing (OT): Needs assistance Is this a change from baseline?: Pre-admission baseline Grooming: Independent Feeding: Independent Bathing: Needs assistance Is this a change from baseline?: Pre-admission baseline Toileting: Needs assistance Is this a change from baseline?: Pre-admission baseline In/Out Bed: Needs assistance Is this a change from baseline?: Pre-admission baseline Walks in Home: Independent with device (comment) Does the patient have difficulty walking or climbing stairs?: Yes Weakness of Legs: None Weakness of Arms/Hands: None  Permission Sought/Granted Permission sought to share information with : Family Supports                Emotional Assessment Appearance:: Appears stated age Attitude/Demeanor/Rapport: Engaged Affect (typically observed): Appropriate Orientation: : Oriented to Situation, Oriented to  Time, Oriented to Place, Oriented to Self Alcohol / Substance Use: Not Applicable Psych Involvement: No (comment)  Admission diagnosis:  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 DCM (dilated cardiomyopathy) ([place])   .  AKI (acute kidney injury) ([place])   . Acute CVA  (cerebrovascular accident) ([place]) 08/11/2019  . Acute lower UTI 08/11/2019  . Essential hypertension 08/11/2019  . Acute encephalopathy 08/11/2019  . CKD (chronic kidney disease) stage 4, GFR 15-29 ml/min (HCC) 08/11/2019  . Multiple sclerosis ([place]) 08/11/2019  . Pressure injury of skin 08/11/2019  . Acute respiratory failure ([place])   . Torsades de pointes ([place])   . Renal insufficiency   . VF (ventricular fibrillation) ([place])    PCP:  Derrill Center., MD Pharmacy:   [facility] [phone] - [place], [address] [place] [address] Phone: [phone] Fax: [phone]     Social Determinants of Health (SDOH) Interventions    Readmission Risk Interventions No flowsheet data found.

## 2019-08-19 DIAGNOSIS — L8995 Pressure ulcer of unspecified site, unstageable: Secondary | ICD-10-CM

## 2019-08-19 LAB — BASIC METABOLIC PANEL
Anion gap: 11 (ref 5–15)
BUN: 10 mg/dL (ref 8–23)
CO2: 21 mmol/L — ABNORMAL LOW (ref 22–32)
Calcium: 9.2 mg/dL (ref 8.9–10.3)
Chloride: 109 mmol/L (ref 98–111)
Creatinine, Ser: 1.46 mg/dL — ABNORMAL HIGH (ref 0.44–1.00)
GFR calc Af Amer: 43 mL/min — ABNORMAL LOW (ref >60)
GFR calc non Af Amer: 37 mL/min — ABNORMAL LOW (ref >60)
Glucose, Bld: 84 mg/dL (ref 70–99)
Potassium: 3.5 mmol/L (ref 3.5–5.1)
Sodium: 141 mmol/L (ref 135–145)

## 2019-08-19 NOTE — Progress Notes (Signed)
PROGRESS NOTE    Jeanne Bruce  QQV:[id number] DOB: 25-Nov-1955 DOA: 08/11/2019 PCP: Derrill Center., MD   Brief Narrative:  Jeanne Bruce is a 63 y.o. F with MS, history of colon cancer with a colostomy, recent right nephrectomy for renal abscess (October). She is admitted with progressive weakness and then acute focal neurological signs concerning for acute CVA.  In the ER, she suffered a brief respiratory and VF arrest in the ED after receiving low-dose lorazepam as evaluation was underway. She was intubated, resuscitated with 1 round of CPR. Hypothermia protocol was deferred.HerMRI, EEG were reassuring. She has been treated with empiric antibiotics with a working diagnosis of sepsis as a precipitant of acute encephalopathy.Urine culture showed multiple species.Cardiac medicine chest with the help of cardiology team. Holding off on ARB/ACE inhibitor at this time.  Now pending CIR insurance auth. Assessment & Plan   Sepsis secondary to UTI -patient with history of UTI (E. 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  LV severely decreased function.  LV diastolic Doppler parameters are consistent with impaired relaxation.  EF severely depressed with diffuse hypokinesis, septal and apical akinesis. -Status post cardiac catheterization which showed normal coronary arteries. -Cardiology consulted and appreciated, diagnosed Takotsubo -Currently patient is euvolemic and  stable.  No shortness of breath. -Continue hydralazine, Coreg, Imdur -Patient to follow-up with cardiology as an outpatient and obtain echocardiogram  Acute metabolic encephalopathy -Resolved -Possibly secondary to sepsis -Patient presented with questionable CVA- ruled out -UDS negative -CT head: No intracranial hemorrhage -MRI brain: Background pattern of chronic multiple sclerosis.  No acute or subacute ischemic infarction. -CTA head and neck showed no intracranial arterial occlusion or high-grade stenosis.  No dissection, aneurysm or hemodynamically significant stenosis of the major cervical or intracranial arteries. -Echocardiogram as above -LDL 92, hemoglobn A1c 4.6 -Neurology consulted and appreciated -EEG: Severe diffuse encephalopathy, nonischemic etiology.  No seizure or definite left form discharges were seen throughout the recording.  V. fib arrest/acute respiratory failure -Secondary to respiratory suppression insufficiency -Patient did require defibrillation and CPR for 2 minutes -Patient did require intubation on 08/11/2019 through 08/12/2019  Essential hypertension -stable -Continue Coreg, hydralazine, Imdur  Acute kidney injury on Chronic kidney disease, stage IIIb -Status post right nephrectomy on 07/22/2019 -during hospitalization, creatinine peaked to 1.97, now back to baseline 1.49 -Stable  History multiple sclerosis -Follow-up with neurology, continue Tysabri  History of colon cancer status post colectomy/colostomy -Stable  Stage II pressure injuries sacrum -Present on admission -Wound care consulted and appreciated: "Cleanse sacral wound with soap and water and pat dry. Apply sacral foam dressing. Change every three days and PRN soilage."  Depression -Lexapro held  Physical deconditioning -PT recommending CIR -Inpatient rehab consulted and appreciated  DVT Prophylaxis  lovenox  Code Status: Full  Family Communication: None at bedside   Disposition Plan: Admitted. Pending CIR auth.  Procedures: Echocardiogram EEG Intubation/extubation Cardiac catheterization  Consultations: PCCM Neurology Cardiology  Inpatient rehab  Antibiotics   Anti-infectives (From admission, onward)   Start     Dose/Rate Route Frequency Ordered Stop   08/18/19  1530  cefdinir (OMNICEF) capsule 300 mg     300 mg Oral Every 12 hours 08/18/19 1515     08/13/19 1100  cefdinir (OMNICEF) capsule 300 mg     300 mg Oral Daily 08/13/19 0954 08/17/19 0833   08/11/19 1800  metroNIDAZOLE (FLAGYL) IVPB 500 mg  Status:  Discontinued     500 mg 100 mL/hr over 60 Minutes Intravenous Every 6 hours 08/11/19 1454 08/13/19 0954   08/11/19 0600  cefTRIAXone (ROCEPHIN) 1 g in sodium chloride 0.9 % 100 mL IVPB  Status:  Discontinued     1 g 200 mL/hr over 30 Minutes Intravenous Daily 08/11/19 0525 08/13/19 0954      Subjective:   Markesha Lockridge seen and examined today.  Patient denies any current chest pain, shortness of breath, abdominal pain, N/V/D/C. Feels weak.  Objective:   Vitals:   08/18/19 2050 08/18/19 2127 08/19/19 0609 08/19/19 1504  BP: (!) 141/67  (!) 150/68 (!) 132/58  Pulse:  81 78 90  Resp: 16     Temp:  98.4 F (36.9 C) 98 F (36.7 C) 98.3 F (36.8 C)  TempSrc:  Oral Oral Oral  SpO2:  99% 99% 100%  Weight:   56.2 kg   Height:        Intake/Output Summary (Last 24 hours) at 08/19/2019 1606 Last data filed at 08/19/2019 0915 Gross per 24 hour  Intake 240 ml  Output 650 ml  Net -410 ml   Filed Weights   08/15/19 0500 08/16/19 0510 08/19/19 0609  Weight: 58.4 kg 58.3 kg 56.2 kg    Exam  General: Well developed, well nourished, NAD, appears stated age  27: NCAT, PERRLA, EOMI, Anicteic Sclera, mucous membranes moist.   Cardiovascular: S1 S2 auscultated, RRR, no murmur  Respiratory: Clear to auscultation bilaterally  Abdomen: Soft, nontender, nondistended, + bowel sounds, +ostomy  Extremities: warm dry without  cyanosis clubbing or edema  Neuro: AAOx3, nonfocal  Psych: Appropriate mood and affect   Data Reviewed: I have personally reviewed following labs and imaging studies  CBC: Recent Labs  Lab 08/13/19 0142 08/15/19 0330 08/16/19 0443  WBC 9.7 7.7 7.5  HGB 9.0* 9.0* 9.0*  HCT 29.4* 29.6* 29.1*  MCV 98.3 97.7 96.7  PLT 268 257 102   Basic Metabolic Panel: Recent Labs  Lab 08/15/19 0330 08/16/19 0443 08/17/19 0346 08/18/19 0422 08/19/19 0654  NA 139 138 141 141 141  K 3.3* 4.0 4.1 3.4* 3.5  CL 107 109 111 110 109  CO2 21* 22 20* 22 21*  GLUCOSE 98 96 79 81 84  BUN 15 14 16 16 10   CREATININE 1.49* 1.46* 1.40* 1.51* 1.46*  CALCIUM 8.3* 8.4* 8.6* 9.0 9.2   GFR: Estimated Creatinine Clearance: 30.4 mL/min (A) (by C-G formula based on SCr of 1.46 mg/dL (H)). Liver Function Tests: Recent Labs  Lab 08/13/19 0142  AST 13*  ALT 27  ALKPHOS 86  BILITOT 0.3  PROT 5.4*  ALBUMIN 2.6*   No results for input(s): LIPASE, AMYLASE in the last 168 hours. No results for input(s): AMMONIA in the last 168 hours. Coagulation Profile: No results for input(s): INR, PROTIME in the last 168 hours. Cardiac Enzymes: No results for input(s): CKTOTAL, CKMB, CKMBINDEX, TROPONINI in the last 168 hours. BNP (last 3 results) No results for input(s): PROBNP in the last 8760 hours. HbA1C: No results for input(s): HGBA1C in the last 72 hours. CBG: Recent Labs  Lab 08/13/19 1601 08/13/19 1956 08/14/19 0005 08/14/19 7253  08/14/19 1212  GLUCAP 135* 124* 105* 104* 88   Lipid Profile: No results for input(s): CHOL, HDL, LDLCALC, TRIG, CHOLHDL, LDLDIRECT in the last 72 hours. Thyroid Function Tests: No results for input(s): TSH, T4TOTAL, FREET4, T3FREE, THYROIDAB in the last 72 hours. Anemia Panel: No results for input(s): VITAMINB12, FOLATE, FERRITIN, TIBC, IRON, RETICCTPCT in the last 72 hours. Urine analysis:    Component Value Date/Time   COLORURINE STRAW (A) 08/11/2019 0113    APPEARANCEUR HAZY (A) 08/11/2019 0113   LABSPEC 1.015 08/11/2019 0113   PHURINE 8.0 08/11/2019 0113   GLUCOSEU NEGATIVE 08/11/2019 0113   HGBUR TRACE (A) 08/11/2019 0113   BILIRUBINUR NEGATIVE 08/11/2019 0113   KETONESUR 15 (A) 08/11/2019 0113   PROTEINUR 100 (A) 08/11/2019 0113   NITRITE NEGATIVE 08/11/2019 0113   LEUKOCYTESUR SMALL (A) 08/11/2019 0113   Sepsis Labs: @LABRCNTIP (procalcitonin:4,lacticidven:4)  ) Recent Results (from the past 240 hour(s))  Urine culture     Status: Abnormal   Collection Time: 08/11/19  1:13 AM   Specimen: Urine, Catheterized  Result Value Ref Range Status   Specimen Description   Final    URINE, CATHETERIZED Performed at [facility], [address] [place], [place] [address]    Special Requests   Final    NONE Performed at [facility], [address] [place], [place] [address]    Culture MULTIPLE SPECIES PRESENT, SUGGEST RECOLLECTION (A)  Final   Report Status 08/12/2019 FINAL  Final  SARS CORONAVIRUS 2 (TAT 6-24 HRS) Nasopharyngeal Nasopharyngeal Swab     Status: None   Collection Time: 08/11/19  2:08 AM   Specimen: Nasopharyngeal Swab  Result Value Ref Range Status   SARS Coronavirus 2 NEGATIVE NEGATIVE Final    Comment: (NOTE) SARS-CoV-2 target nucleic acids are NOT DETECTED. The SARS-CoV-2 RNA is generally detectable in upper and lower respiratory specimens during the acute phase of infection. Negative results do not preclude SARS-CoV-2 infection, do not rule out co-infections with other pathogens, and should not be used as the sole basis for treatment or other patient management decisions. Negative results must be combined with clinical observations, patient history, and epidemiological information. The expected result is Negative. Fact Sheet for Patients: SugarRoll.be Fact Sheet for Healthcare Providers: https://www.woods-mathews.com/ This test is not yet approved  or cleared by the [place] FDA and  has been authorized for detection and/or diagnosis of SARS-CoV-2 by FDA under an Emergency Use Authorization (EUA). This EUA will remain  in effect (meaning this test can be used) for the duration of the COVID-19 declaration under Section 56 4(b)(1) of the Act, 21 U.S.C. section 360bbb-3(b)(1), unless the authorization is terminated or revoked sooner. Performed at [facility], [place] [address] [place], [place] [address]   Culture, blood (Routine X 2) w Reflex to ID Panel     Status: None   Collection Time: 08/11/19  8:36 AM   Specimen: BLOOD LEFT HAND  Result Value Ref Range Status   Specimen Description BLOOD LEFT HAND  Final   Special Requests   Final    BOTTLES DRAWN AEROBIC ONLY Blood Culture results may not be optimal due to an inadequate volume of blood received in culture bottles   Culture   Final    NO GROWTH 5 DAYS Performed at [facility], [place] [address], [place],  [address]    Report Status 08/16/2019 FINAL  Final  Culture, blood (Routine X 2) w Reflex to ID Panel  Status: None   Collection Time: 08/11/19  9:30 AM   Specimen: BLOOD LEFT HAND  Result Value Ref Range Status   Specimen Description BLOOD LEFT HAND  Final   Special Requests   Final    BOTTLES DRAWN AEROBIC ONLY Blood Culture results may not be optimal due to an inadequate volume of blood received in culture bottles   Culture   Final    NO GROWTH 5 DAYS Performed at [facility], [place] [address] [place], [place] [address]    Report Status 08/16/2019 FINAL  Final  SARS Coronavirus 2 by RT PCR (hospital order, performed in [facility] hospital lab) Nasopharyngeal Nasopharyngeal Swab     Status: None   Collection Time: 08/11/19 10:23 AM   Specimen: Nasopharyngeal Swab  Result Value Ref Range Status   SARS Coronavirus 2 NEGATIVE NEGATIVE Final    Comment: (NOTE) If result is NEGATIVE SARS-CoV-2 target nucleic acids are NOT DETECTED. The  SARS-CoV-2 RNA is generally detectable in upper and lower  respiratory specimens during the acute phase of infection. The lowest  concentration of SARS-CoV-2 viral copies this assay can detect is 250  copies / mL. A negative result does not preclude SARS-CoV-2 infection  and should not be used as the sole basis for treatment or other  patient management decisions.  A negative result may occur with  improper specimen collection / handling, submission of specimen other  than nasopharyngeal swab, presence of viral mutation(s) within the  areas targeted by this assay, and inadequate number of viral copies  (<250 copies / mL). A negative result must be combined with clinical  observations, patient history, and epidemiological information. If result is POSITIVE SARS-CoV-2 target nucleic acids are DETECTED. The SARS-CoV-2 RNA is generally detectable in upper and lower  respiratory specimens dur ing the acute phase of infection.  Positive  results are indicative of active infection with SARS-CoV-2.  Clinical  correlation with patient history and other diagnostic information is  necessary to determine patient infection status.  Positive results do  not rule out bacterial infection or co-infection with other viruses. If result is PRESUMPTIVE POSTIVE SARS-CoV-2 nucleic acids MAY BE PRESENT.   A presumptive positive result was obtained on the submitted specimen  and confirmed on repeat testing.  While 2019 novel coronavirus  (SARS-CoV-2) nucleic acids may be present in the submitted sample  additional confirmatory testing may be necessary for epidemiological  and / or clinical management purposes  to differentiate between  SARS-CoV-2 and other Sarbecovirus currently known to infect humans.  If clinically indicated additional testing with an alternate test  methodology [phone]) is advised. The SARS-CoV-2 RNA is generally  detectable in upper and lower respiratory sp ecimens during the acute   phase of infection. The expected result is Negative. Fact Sheet for Patients:  StrictlyIdeas.no Fact Sheet for Healthcare Providers: BankingDealers.co.za This test is not yet approved or cleared by the [place] FDA and has been authorized for detection and/or diagnosis of SARS-CoV-2 by FDA under an Emergency Use Authorization (EUA).  This EUA will remain in effect (meaning this test can be used) for the duration of the COVID-19 declaration under Section 564(b)(1) of the Act, 21 U.S.C. section 360bbb-3(b)(1), unless the authorization is terminated or revoked sooner. Performed at [facility], [place] [address], [place], [place] [address]   MRSA PCR Screening     Status: None   Collection Time: 08/11/19  3:01 PM   Specimen: Nasal Mucosa; Nasopharyngeal  Result  Value Ref Range Status   MRSA by PCR NEGATIVE NEGATIVE Final    Comment:        The GeneXpert MRSA Assay (FDA approved for NASAL specimens only), is one component of a comprehensive MRSA colonization surveillance program. It is not intended to diagnose MRSA infection nor to guide or monitor treatment for MRSA infections. Performed at [facility], [place] [address] [place], [place] [address]   Culture, Urine     Status: Abnormal (Preliminary result)   Collection Time: 08/18/19  4:14 PM   Specimen: Urine, Random  Result Value Ref Range Status   Specimen Description URINE, RANDOM  Final   Special Requests NONE  Final   Culture (A)  Final    >=100,000 COLONIES/mL PSEUDOMONAS AERUGINOSA SUSCEPTIBILITIES TO FOLLOW Performed at [facility], [place] [address] [place], [place] [address]    Report Status PENDING  Incomplete      Radiology Studies: No results found.   Scheduled Meds: . carvedilol  6.25 mg Oral BID WC  . cefdinir  300 mg Oral Q12H  . enoxaparin (LOVENOX) injection  30 mg Subcutaneous Daily  . feeding supplement (ENSURE ENLIVE)  237 mL Oral  BID BM  . hydrALAZINE  25 mg Oral Q8H  . isosorbide mononitrate  15 mg Oral Daily  . sodium chloride flush  3 mL Intravenous Q12H   Continuous Infusions: . sodium chloride Stopped (08/14/19 1651)  . sodium chloride       LOS: 8 days   Time Spent in minutes   30 minutes  Brandonn Capelli D.O. on 08/19/2019 at 4:06 PM  Between 7am to 7pm - Please see pager noted on [email or web address]  After 7pm go to [email or web address]  And look for the night coverage person covering for me after hours  [facility] Office  [phone] ]

## 2019-08-19 NOTE — Plan of Care (Signed)
  Problem: Clinical Measurements: Goal: Will remain free from infection Outcome: Progressing   Problem: Education: Goal: Knowledge of General Education information will improve Description: Including pain rating scale, medication(s)/side effects and non-pharmacologic comfort measures Outcome: Completed/Met   Problem: Health Behavior/Discharge Planning: Goal: Ability to manage health-related needs will improve Outcome: Completed/Met   Problem: Clinical Measurements: Goal: Ability to maintain clinical measurements within normal limits will improve Outcome: Completed/Met Goal: Diagnostic test results will improve Outcome: Completed/Met Goal: Respiratory complications will improve Outcome: Completed/Met Goal: Cardiovascular complication will be avoided Outcome: Completed/Met   Problem: Activity: Goal: Risk for activity intolerance will decrease Outcome: Completed/Met   Problem: Nutrition: Goal: Adequate nutrition will be maintained Outcome: Completed/Met   Problem: Coping: Goal: Level of anxiety will decrease Outcome: Completed/Met   Problem: Elimination: Goal: Will not experience complications related to bowel motility Outcome: Completed/Met Goal: Will not experience complications related to urinary retention Outcome: Completed/Met   Problem: Pain Managment: Goal: General experience of comfort will improve Outcome: Completed/Met

## 2019-08-19 NOTE — Plan of Care (Signed)
  Problem: Safety: Goal: Ability to remain free from injury will improve Outcome: Completed/Met

## 2019-08-19 NOTE — Progress Notes (Signed)
Inpatient Rehabilitation Admissions Coordinator  I have not received approval decision yet on CIR admit. I spoke with spouse, Dominica Severin by phone. He and patient to discuss if they would like to continue to pursue CIR admit, for they wish to d/c home at the latest on Saturday for a follow up [place] [facility] appointment . I will follow up in the am.  Danne Baxter, RN, MSN Rehab Admissions Coordinator [phone] 08/19/2019 5:22 PM

## 2019-08-19 NOTE — Progress Notes (Signed)
Physical Therapy Treatment Patient Details Name: Jeanne Bruce MRN: [id number] DOB: 1956/05/09 Today's Date: 08/19/2019    History of Present Illness 63 year old woman with a history of MS, colon cancer with colostomy, recent right nephrectomy for renal abscess (discharged mid October).  Presented with progressive rt-sided weakness, lethargy and then acute focal neurological signs concerning for possible stroke.  She had a paradoxical reaction to low-dose lorazepam, resulted in respiratory depression and a VF arrest.  She underwent brief CPR, required intubation mechanical ventilation. MRI brain does not show any evidence of CVA. EEG without any evidence of seizure activity; echocardiogram 10/26 that showed a new drop in EF 20-25% with septal and apical akinesis and evidence for diastolic dysfunction. Severe sepsis with encephalopathy    PT Comments    Pt with improved activity tolerance today. Continue to recommend CIR prior to return home to maximize independence.    Follow Up Recommendations  CIR;Supervision/Assistance - 24 hour     Equipment Recommendations  None recommended by PT    Recommendations for Other Services       Precautions / Restrictions Precautions Precautions: Fall Precaution Comments: ostomy L abdomen/ bandage R side of abdomen Restrictions Weight Bearing Restrictions: No    Mobility  Bed Mobility Overal bed mobility: Needs Assistance Bed Mobility: Supine to Sit;Sit to Supine     Supine to sit: Min guard;HOB elevated Sit to supine: Min guard   General bed mobility comments: Incr time and effort and use of bed rails and HOB elevated  Transfers Overall transfer level: Needs assistance Equipment used: Rolling walker (2 wheeled) Transfers: Sit to/from Stand Sit to Stand: Min assist Stand pivot transfers: Min assist       General transfer comment: Assist for balance. Bed to bsc with stand pivot with HHA for balance.   Ambulation/Gait Ambulation/Gait  assistance: Min assist Gait Distance (Feet): 125 Feet Assistive device: Rolling walker (2 wheeled) Gait Pattern/deviations: Step-through pattern;Decreased stride length;Trendelenburg;Decreased dorsiflexion - left Gait velocity: decr Gait velocity interpretation: <1.31 ft/sec, indicative of household ambulator General Gait Details: Assist for balance. Verbal cues to stand more erect.    Stairs             Wheelchair Mobility    Modified Rankin (Stroke Patients Only)       Balance Overall balance assessment: Needs assistance Sitting-balance support: No upper extremity supported;Feet supported Sitting balance-Leahy Scale: Good     Standing balance support: Single extremity supported Standing balance-Leahy Scale: Poor Standing balance comment: walker and min guard for static standing                            Cognition Arousal/Alertness: Awake/alert Behavior During Therapy: WFL for tasks assessed/performed Overall Cognitive Status: Within Functional Limits for tasks assessed                                        Exercises      General Comments        Pertinent Vitals/Pain Pain Assessment: No/denies pain    Home Living                      Prior Function            PT Goals (current goals can now be found in the care plan section) Progress towards PT goals: Progressing toward goals  Frequency    Min 3X/week      PT Plan Current plan remains appropriate    Co-evaluation              AM-PAC PT "6 Clicks" Mobility   Outcome Measure  Help needed turning from your back to your side while in a flat bed without using bedrails?: A Little Help needed moving from lying on your back to sitting on the side of a flat bed without using bedrails?: A Little Help needed moving to and from a bed to a chair (including a wheelchair)?: A Little Help needed standing up from a chair using your arms (e.g., wheelchair or  bedside chair)?: A Little Help needed to walk in hospital room?: A Little Help needed climbing 3-5 steps with a railing? : A Lot 6 Click Score: 17    End of Session Equipment Utilized During Treatment: Gait belt Activity Tolerance: Patient tolerated treatment well Patient left: with call bell/phone within reach;in bed(pt sitting EOB) Nurse Communication: Mobility status PT Visit Diagnosis: Other abnormalities of gait and mobility (R26.89);Muscle weakness (generalized) (M62.81);Other symptoms and signs involving the nervous system (R29.898)     Time: 2202-5427 PT Time Calculation (min) (ACUTE ONLY): 19 min  Charges:  $Gait Training: 8-22 mins                     [place] Pager [phone] Office [place] 08/19/2019, 11:46 AM

## 2019-08-20 LAB — URINE CULTURE: Culture: >=100000 — AB

## 2019-08-20 MED ORDER — ENSURE ENLIVE PO LIQD: 237.0000 mL | Freq: Two times a day (BID) | ORAL | 12 refills | Status: DC

## 2019-08-20 MED ORDER — LEVOFLOXACIN 250 MG PO TABS: 250.0000 mg | ORAL_TABLET | Freq: Every day | ORAL | 0 refills | Status: AC

## 2019-08-20 MED ORDER — CARVEDILOL 6.25 MG PO TABS: 6.2500 mg | ORAL_TABLET | Freq: Two times a day (BID) | ORAL | 0 refills | Status: DC

## 2019-08-20 MED ORDER — LEVOFLOXACIN 250 MG PO TABS: 250.0000 mg | ORAL_TABLET | Freq: Every day | ORAL | 0 refills | Status: DC

## 2019-08-20 MED ORDER — LEVOFLOXACIN 500 MG PO TABS: 250.0000 mg | ORAL_TABLET | Freq: Every day | ORAL | Status: DC

## 2019-08-20 MED ORDER — ISOSORBIDE MONONITRATE ER 30 MG PO TB24: 15.0000 mg | ORAL_TABLET | Freq: Every day | ORAL | 0 refills | Status: DC

## 2019-08-20 MED ORDER — CEFDINIR 300 MG PO CAPS: 300.0000 mg | ORAL_CAPSULE | Freq: Two times a day (BID) | ORAL | 0 refills | Status: DC

## 2019-08-20 MED ORDER — HYDRALAZINE HCL 25 MG PO TABS: 25.0000 mg | ORAL_TABLET | Freq: Three times a day (TID) | ORAL | 0 refills | Status: DC

## 2019-08-20 NOTE — Progress Notes (Signed)
Physical Therapy Treatment Patient Details Name: Jeanne Bruce MRN: [id number] DOB: 02-May-1956 Today's Date: 08/20/2019    History of Present Illness 63 year old woman with a history of MS, colon cancer with colostomy, recent right nephrectomy for renal abscess (discharged mid October).  Presented with progressive rt-sided weakness, lethargy and then acute focal neurological signs concerning for possible stroke.  She had a paradoxical reaction to low-dose lorazepam, resulted in respiratory depression and a VF arrest.  She underwent brief CPR, required intubation mechanical ventilation. MRI brain does not show any evidence of CVA. EEG without any evidence of seizure activity; echocardiogram 10/26 that showed a new drop in EF 20-25% with septal and apical akinesis and evidence for diastolic dysfunction. Severe sepsis with encephalopathy    PT Comments    Pt doing well with mobility and has decided to return home with husband and HHPT.    Follow Up Recommendations  Home health PT;Supervision - Intermittent     Equipment Recommendations  None recommended by PT    Recommendations for Other Services       Precautions / Restrictions Precautions Precautions: Fall Precaution Comments: ostomy L abdomen/ bandage R side of abdomen Restrictions Weight Bearing Restrictions: No    Mobility  Bed Mobility Overal bed mobility: Modified Independent Bed Mobility: Supine to Sit;Sit to Supine     Supine to sit: HOB elevated;Modified independent (Device/Increase time) Sit to supine: Modified independent (Device/Increase time);HOB elevated   General bed mobility comments: Incr time and effort and use of bed rails and HOB elevated  Transfers Overall transfer level: Needs assistance Equipment used: Rolling walker (2 wheeled) Transfers: Sit to/from Stand Sit to Stand: Supervision         General transfer comment: Supervision for safety  Ambulation/Gait Ambulation/Gait assistance:  Supervision Gait Distance (Feet): 170 Feet Assistive device: Rolling walker (2 wheeled) Gait Pattern/deviations: Step-through pattern;Decreased stride length Gait velocity: decr Gait velocity interpretation: 1.31 - 2.62 ft/sec, indicative of limited community ambulator General Gait Details: supervision for safety   Stairs             Wheelchair Mobility    Modified Rankin (Stroke Patients Only)       Balance Overall balance assessment: Needs assistance Sitting-balance support: No upper extremity supported;Feet supported Sitting balance-Leahy Scale: Good     Standing balance support: Single extremity supported Standing balance-Leahy Scale: Poor Standing balance comment: walker and supervision for static standing                            Cognition Arousal/Alertness: Awake/alert Behavior During Therapy: WFL for tasks assessed/performed Overall Cognitive Status: Within Functional Limits for tasks assessed                                        Exercises      General Comments        Pertinent Vitals/Pain Pain Assessment: No/denies pain Faces Pain Scale: No hurt    Home Living                      Prior Function            PT Goals (current goals can now be found in the care plan section) Progress towards PT goals: Goals met and updated - see care plan    Frequency    Min 3X/week  PT Plan Discharge plan needs to be updated    Co-evaluation              AM-PAC PT "6 Clicks" Mobility   Outcome Measure  Help needed turning from your back to your side while in a flat bed without using bedrails?: None Help needed moving from lying on your back to sitting on the side of a flat bed without using bedrails?: A Little Help needed moving to and from a bed to a chair (including a wheelchair)?: A Little Help needed standing up from a chair using your arms (e.g., wheelchair or bedside chair)?: None Help  needed to walk in hospital room?: A Little Help needed climbing 3-5 steps with a railing? : A Lot 6 Click Score: 19    End of Session   Activity Tolerance: Patient tolerated treatment well Patient left: with call bell/phone within reach;in bed Nurse Communication: Mobility status PT Visit Diagnosis: Other abnormalities of gait and mobility (R26.89);Muscle weakness (generalized) (M62.81);Other symptoms and signs involving the nervous system (R29.898)     Time: 0955-1010 PT Time Calculation (min) (ACUTE ONLY): 15 min  Charges:  $Gait Training: 8-22 mins                     [place] Pager [phone] Office [place] 08/20/2019, 10:38 AM

## 2019-08-20 NOTE — Discharge Summary (Addendum)
Physician Discharge Summary [facility]    Patient: Jeanne Bruce                   Admit date: 08/11/2019   DOB: Jan 05, 1956             Discharge date:08/20/2019/11:20 AM [id number]                          PCP: Derrill Center., MD  Disposition: Home   Recommendations for Outpatient Follow-up:    Follow up: in 2 week  Discharge Condition: Stable   Code Status:   Code Status: Full Code  Diet recommendation: Cardiac diet   Discharge Diagnoses:    Principal Problem:   Acute CVA (cerebrovascular accident) [facility]) Active Problems:   Acute lower UTI   Essential hypertension   Acute encephalopathy   CKD (chronic kidney disease) stage 4, GFR 15-29 ml/min (HCC)   Multiple sclerosis ([place])   Acute respiratory failure (HCC)   Renal insufficiency   VF (ventricular fibrillation) ([place])   Pressure injury of skin   DCM (dilated cardiomyopathy) ([place])   AKI (acute kidney injury) ([place])   History of Present Illness/ Hospital Course Jeanne Bruce Summary:   Brief Narrative:  Jeanne Bruce is a48 y.o.F with MS, history of colon cancer with a colostomy, recent right nephrectomy for renal abscess (October). She is admitted with progressive weakness and then acute focal neurological signs concerning for acute CVA.  In the ER, she suffered a brief respiratory and VF arrest in the ED after receiving low-dose lorazepam as evaluation was underway. She was intubated, resuscitated with 1 round of CPR. Hypothermia protocol was deferred.HerMRI, EEG were reassuring. She has been treated with empiric antibiotics with a working diagnosis of sepsis as a precipitant of acute encephalopathy.Urine culture showed multiple species.Cardiac medicine chest with the help of cardiology team. Holding off on ARB/ACE inhibitor at this time.   Patient did not get approved by CIR, patient and husband requested to be discharged home, will follow with outpatient PT OT Assessment & Plan   The patient was  seen and examined this morning, stable no acute distress requested to be discharged home. No changes to current plan, agrees to discharge patient home prescription for outpatient PT OT given, patient is to continue antibiotics for 7 more days.  sepsis secondary to UTI -patient with history of UTI (E. 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 LV severely decreased function. LV diastolic Doppler parameters are consistent with impaired relaxation. EF severely depressed with diffuse hypokinesis, septal and apical akinesis. -Status post cardiac catheterization which showed normal coronary arteries. -Cardiology consulted and appreciated, diagnosed Takotsubo -Currently patient is euvolemic and stable. No shortness of breath. -Continue hydralazine, Coreg, Imdur -Patient to follow-up with cardiology as an outpatient and obtain echocardiogram  Acute metabolic encephalopathy -Resolved -Possibly secondary to sepsis -Patient presented with questionable CVA- ruled out -UDS negative -CT head:No intracranial hemorrhage -MRI brain:Background pattern of chronic multiple sclerosis. No acute or subacute ischemic  infarction. -CTA head and neck showed no intracranial arterial occlusion or high-grade stenosis. No dissection, aneurysm or hemodynamically significant stenosis of the major cervical or intracranial arteries. -Echocardiogram as  above -LDL 92, hemoglobn A1c 4.6 -Neurology consulted and appreciated -NID:POEUMP diffuse encephalopathy, nonischemic etiology. No seizure or definite left form discharges were seen throughout the recording.  V. fib arrest/acute respiratory failure -Secondary to respiratory suppression insufficiency -Patient did require defibrillation and CPR for 2 minutes -Patient did require intubation on 08/11/2019 through 08/12/2019  Essential hypertension -stable -Continue Coreg, hydralazine, Imdur Prescription follow-up was provided  Acute kidney injury onChronic kidney disease, stage IIIb -Status post right nephrectomy on 07/22/2019 -during hospitalization, creatinine peaked to 1.97, now back to baseline 1.49 -Stable  History multiple sclerosis -Follow-up with neurology, continue Tysabri  History of colon cancer status post colectomy/colostomy -Stable  Stage II pressure injuries sacrum -Present on admission -Wound care consulted and appreciated: "Cleanse sacral wound with soap and water and pat dry. Apply sacral foam dressing. Change every three days and PRN soilage."  Depression -Lexapro held  Physical deconditioning -PT recommending CIR -Inpatient rehab consulted and appreciated  Code Status: Full Disposition Plan:  The patient requested to be discharged home.  Will seek outpatient PT OT  Procedures: Echocardiogram EEG Intubation/extubation Cardiac catheterization  Consultations: PCCM Neurology Cardiology Inpatient rehab       Discharge Instructions:   Discharge Instructions    Activity as tolerated - No restrictions   Complete by: As directed    Diet - low sodium heart healthy   Complete by: As directed    Discharge instructions   Complete by: As directed    Is to see PCP and cardiology, neurology in 1 to 2 weeks Patient aggressive PT/OT   Increase activity slowly   Complete by: As directed        Medication List    STOP taking  these medications   magnesium oxide 400 MG tablet Commonly known as: MAG-OX   omeprazole 20 MG capsule Commonly known as: PRILOSEC   ondansetron 4 MG tablet Commonly known as: ZOFRAN     TAKE these medications   calcium-vitamin D 500-200 MG-UNIT tablet Commonly known as: OSCAL WITH D Take 1 tablet by mouth.   carvedilol 6.25 MG tablet Commonly known as: COREG Take 1 tablet (6.25 mg total) by mouth 2 (two) times daily with a meal.   conjugated estrogens vaginal cream Commonly known as: PREMARIN Place 1 Applicatorful vaginally at bedtime.   escitalopram 10 MG tablet Commonly known as: LEXAPRO Take 10 mg by mouth daily.   feeding supplement (ENSURE ENLIVE) Liqd Take 237 mLs by mouth 2 (two) times daily between meals.   hydrALAZINE 25 MG tablet Commonly known as: APRESOLINE Take 1 tablet (25 mg total) by mouth every 8 (eight) hours.   isosorbide mononitrate 30 MG 24 hr tablet Commonly known as: IMDUR Take 0.5 tablets (15 mg total) by mouth daily. Start taking on: August 21, 2019   levofloxacin 250 MG tablet Commonly known as: LEVAQUIN Take 1 tablet (250 mg total) by mouth daily for 6 days. Start taking on: August 21, 2019   oxyCODONE 5 MG immediate release tablet Commonly known as: Oxy IR/ROXICODONE Take 5 mg by mouth every 4 (four) hours as needed for pain.   potassium chloride SA 20 MEQ tablet Commonly known as: KLOR-CON Take 20 mEq by mouth 2 (two) times daily.   PROBIOTIC DAILY PO Take 1 capsule by mouth daily.   Tysabri 300 MG/15ML injection Generic drug:  natalizumab Inject into the vein every 6 (six) weeks.       Allergies  Allergen Reactions   Ativan [Lorazepam] Other (See Comments)    Resp arrest   Invanz [Ertapenem] Anaphylaxis   Mirabegron Other (See Comments)    Caused Hypertension   Claritin-D 12 Hour [Loratadine-Pseudoephedrine Er] Rash   Penicillins Rash    Pt tolerates cephalosporins  Did it involve swelling of the  face/tongue/throat, SOB, or low BP? No Did it involve sudden or severe rash/hives, skin peeling, or any reaction on the inside of your mouth or nose? No Did you need to seek medical attention at a hospital or doctor's office? No When did it last happen?A long time ago per daughter  If all above answers are NO, may proceed with cephalosporin use.      Procedures /Studies:   Ct Code Stroke Cta Head W/wo Contrast  Result Date: 08/11/2019 CLINICAL DATA:  Sudden onset headache with unilateral weakness. EXAM: CT ANGIOGRAPHY HEAD AND NECK TECHNIQUE: Multidetector CT imaging of the head and neck was performed using the standard protocol during bolus administration of intravenous contrast. Multiplanar CT image reconstructions and MIPs were obtained to evaluate the vascular anatomy. Carotid stenosis measurements (when applicable) are obtained utilizing NASCET criteria, using the distal internal carotid diameter as the denominator. CONTRAST:  134mL OMNIPAQUE IOHEXOL 350 MG/ML SOLN COMPARISON:  Head CT 08/10/2019 FINDINGS: CT HEAD FINDINGS Brain: There is no mass, hemorrhage or extra-axial collection. The size and configuration of the ventricles and extra-axial CSF spaces are normal. There is no acute or chronic infarction. There is hypoattenuation of the periventricular white matter, most commonly indicating chronic ischemic microangiopathy. Skull: The visualized skull base, calvarium and extracranial soft tissues are normal. Sinuses/Orbits: No fluid levels or advanced mucosal thickening of the visualized paranasal sinuses. No mastoid or middle ear effusion. The orbits are normal. CTA NECK FINDINGS SKELETON: There is no bony spinal canal stenosis. No lytic or blastic lesion. OTHER NECK: Normal pharynx, larynx and major salivary glands. No cervical lymphadenopathy. Unremarkable thyroid gland. UPPER CHEST: No pneumothorax or pleural effusion. No nodules or masses. AORTIC ARCH: There is no calcific  atherosclerosis of the aortic arch. There is no aneurysm, dissection or hemodynamically significant stenosis of the visualized portion of the aorta. Conventional 3 vessel aortic branching pattern. The visualized proximal subclavian arteries are widely patent. RIGHT CAROTID SYSTEM: Normal without aneurysm, dissection or stenosis. LEFT CAROTID SYSTEM: Normal without aneurysm, dissection or stenosis. VERTEBRAL ARTERIES: Codominant configuration. Both origins are clearly patent. There is no dissection, occlusion or flow-limiting stenosis to the skull base (V1-V3 segments). CTA HEAD FINDINGS POSTERIOR CIRCULATION: --Vertebral arteries: Normal V4 segments. --Posterior inferior cerebellar arteries (PICA): Patent origins from the vertebral arteries. --Anterior inferior cerebellar arteries (AICA): Patent origins from the basilar artery. --Basilar artery: Normal. --Superior cerebellar arteries: Normal. --Posterior cerebral arteries: Normal. Both originate from the basilar artery. Posterior communicating arteries (p-comm) are diminutive or absent. ANTERIOR CIRCULATION: --Intracranial internal carotid arteries: Normal. --Anterior cerebral arteries (ACA): Normal. Both A1 segments are present. Patent anterior communicating artery (a-comm). --Middle cerebral arteries (MCA): Normal. VENOUS SINUSES: As permitted by contrast timing, patent. ANATOMIC VARIANTS: None Review of the MIP images confirms the above findings. IMPRESSION: 1. No intracranial arterial occlusion or high-grade stenosis. 2. No dissection, aneurysm or hemodynamically significant stenosis of the major cervical or intracranial arteries. Electronically Signed   By: Ulyses Jarred M.D.   On: 08/11/2019 01:39   Ct Code Stroke Cta Neck W/wo Contrast  Result Date: 08/11/2019 CLINICAL  DATA:  Sudden onset headache with unilateral weakness. EXAM: CT ANGIOGRAPHY HEAD AND NECK TECHNIQUE: Multidetector CT imaging of the head and neck was performed using the standard protocol  during bolus administration of intravenous contrast. Multiplanar CT image reconstructions and MIPs were obtained to evaluate the vascular anatomy. Carotid stenosis measurements (when applicable) are obtained utilizing NASCET criteria, using the distal internal carotid diameter as the denominator. CONTRAST:  127mL OMNIPAQUE IOHEXOL 350 MG/ML SOLN COMPARISON:  Head CT 08/10/2019 FINDINGS: CT HEAD FINDINGS Brain: There is no mass, hemorrhage or extra-axial collection. The size and configuration of the ventricles and extra-axial CSF spaces are normal. There is no acute or chronic infarction. There is hypoattenuation of the periventricular white matter, most commonly indicating chronic ischemic microangiopathy. Skull: The visualized skull base, calvarium and extracranial soft tissues are normal. Sinuses/Orbits: No fluid levels or advanced mucosal thickening of the visualized paranasal sinuses. No mastoid or middle ear effusion. The orbits are normal. CTA NECK FINDINGS SKELETON: There is no bony spinal canal stenosis. No lytic or blastic lesion. OTHER NECK: Normal pharynx, larynx and major salivary glands. No cervical lymphadenopathy. Unremarkable thyroid gland. UPPER CHEST: No pneumothorax or pleural effusion. No nodules or masses. AORTIC ARCH: There is no calcific atherosclerosis of the aortic arch. There is no aneurysm, dissection or hemodynamically significant stenosis of the visualized portion of the aorta. Conventional 3 vessel aortic branching pattern. The visualized proximal subclavian arteries are widely patent. RIGHT CAROTID SYSTEM: Normal without aneurysm, dissection or stenosis. LEFT CAROTID SYSTEM: Normal without aneurysm, dissection or stenosis. VERTEBRAL ARTERIES: Codominant configuration. Both origins are clearly patent. There is no dissection, occlusion or flow-limiting stenosis to the skull base (V1-V3 segments). CTA HEAD FINDINGS POSTERIOR CIRCULATION: --Vertebral arteries: Normal V4 segments.  --Posterior inferior cerebellar arteries (PICA): Patent origins from the vertebral arteries. --Anterior inferior cerebellar arteries (AICA): Patent origins from the basilar artery. --Basilar artery: Normal. --Superior cerebellar arteries: Normal. --Posterior cerebral arteries: Normal. Both originate from the basilar artery. Posterior communicating arteries (p-comm) are diminutive or absent. ANTERIOR CIRCULATION: --Intracranial internal carotid arteries: Normal. --Anterior cerebral arteries (ACA): Normal. Both A1 segments are present. Patent anterior communicating artery (a-comm). --Middle cerebral arteries (MCA): Normal. VENOUS SINUSES: As permitted by contrast timing, patent. ANATOMIC VARIANTS: None Review of the MIP images confirms the above findings. IMPRESSION: 1. No intracranial arterial occlusion or high-grade stenosis. 2. No dissection, aneurysm or hemodynamically significant stenosis of the major cervical or intracranial arteries. Electronically Signed   By: Ulyses Jarred M.D.   On: 08/11/2019 01:39   Mr Brain Wo Contrast  Result Date: 08/11/2019 CLINICAL DATA:  History of multiple sclerosis. Right-sided weakness. Headache. Right facial droop and slurred speech. EXAM: MRI HEAD WITHOUT CONTRAST TECHNIQUE: Multiplanar, multiecho pulse sequences of the brain and surrounding structures were obtained without intravenous contrast. COMPARISON:  CT studies same day.  MRI 03/21/2019 FINDINGS: Brain: Chronic pattern of multiple foci of abnormal T2 and FLAIR signal throughout the cerebral hemispheric deep and subcortical white matter consistent with the clinical diagnosis multiple sclerosis. The vast majority of the foci are stable since June. I do think there is slight increase in prominence of a few of the periventricular lesions, particularly at the level of the posterior body of the lateral ventricles, left more than right. No restricted diffusion to suggest acute infarction or restricted diffusion plaque.  Contrast was not ordered or administered. No cortical/large vessel territory abnormality. No mass lesion, hemorrhage, hydrocephalus or extra-axial collection. Vascular: Major vessels at the base of the brain show flow. Skull  and upper cervical spine: Negative Sinuses/Orbits: Mild mucosal inflammatory changes of the maxillary sinuses. Other sinuses clear. Orbits negative. Other: None IMPRESSION: Background pattern of chronic multiple sclerosis. Majority of the white matter lesions are stable. Question slight progression of periventricular white matter disease at the level of the posterior body of the lateral ventricles, probably more so on the left than the right. No lesions show restricted diffusion. I do not see a finding to suggest acute or subacute ischemic infarction. Certainly, in a person of this age, some of these white matter lesions could also relate to chronic small vessel disease. Electronically Signed   By: Nelson Chimes M.D.   On: 08/11/2019 11:59   Dg Chest Portable 1 View  Result Date: 08/11/2019 CLINICAL DATA:  Intubation EXAM: PORTABLE CHEST 1 VIEW COMPARISON:  02/22/2019 FINDINGS: Endotracheal tube with tip 1 cm above the carina. The orogastric tube tip and side-port reaches the stomach. Normal heart size for portable technique. Calcified granuloma over the right lower lobe. There is no edema, consolidation, effusion, or pneumothorax. IMPRESSION: 1. Endotracheal tube with tip only 1 cm above the carina. 2. Orogastric tube in good position. 3. No evidence of acute cardiopulmonary disease. Electronically Signed   By: Monte Fantasia M.D.   On: 08/11/2019 07:39   Ct Head Code Stroke Wo Contrast  Result Date: 08/11/2019 CLINICAL DATA:  Code stroke.  Headache with unilateral weakness. EXAM: CT HEAD WITHOUT CONTRAST TECHNIQUE: Contiguous axial images were obtained from the base of the skull through the vertex without intravenous contrast. COMPARISON:  None. FINDINGS: Brain: There is no mass,  hemorrhage or extra-axial collection. The size and configuration of the ventricles and extra-axial CSF spaces are normal. There is hypoattenuation of the periventricular white matter, most commonly indicating chronic ischemic microangiopathy. Vascular: No abnormal hyperdensity of the major intracranial arteries or dural venous sinuses. No intracranial atherosclerosis. Skull: The visualized skull base, calvarium and extracranial soft tissues are normal. Sinuses/Orbits: No fluid levels or advanced mucosal thickening of the visualized paranasal sinuses. No mastoid or middle ear effusion. The orbits are normal. ASPECTS [facility] Stroke Program Early CT Score) - Ganglionic level infarction (caudate, lentiform nuclei, internal capsule, insula, M1-M3 cortex): 7 - Supraganglionic infarction (M4-M6 cortex): 3 Total score (0-10 with 10 being normal): 10 IMPRESSION: 1. Chronic small vessel disease.  No intracranial hemorrhage. 2. ASPECTS is 10. 3. These results were called by telephone at the time of interpretation on 08/11/2019 at 12:45 am to Dr. Thayer Jew , who verbally acknowledged these results. Electronically Signed   By: Ulyses Jarred M.D.   On: 08/11/2019 00:45    Subjective:   Patient was seen and examined 08/20/2019, 11:20 AM Patient stable today. No acute distress.  No issues overnight Stable for discharge.  Discharge Exam:    Vitals:   08/19/19 0609 08/19/19 1504 08/19/19 1927 08/20/19 0520  BP: (!) 150/68 (!) 132/58 (!) 143/84 (!) 142/76  Pulse: 78 90 84 83  Resp:      Temp: 98 F (36.7 C) 98.3 F (36.8 C) 98.3 F (36.8 C) 98.5 F (36.9 C)  TempSrc: Oral Oral Oral Oral  SpO2: 99% 100% 100% 98%  Weight: 56.2 kg   55.5 kg  Height:        General: Pt lying comfortably in bed & appears in no obvious distress. Cardiovascular: S1 & S2 heard, RRR, S1/S2 +. No murmurs, rubs, gallops or clicks. No JVD or pedal edema. Respiratory: Clear to auscultation without wheezing, rhonchi or crackles.  No  increased work of breathing. Abdominal:  Non-distended, non-tender & soft. No organomegaly or masses appreciated. Normal bowel sounds heard. CNS: Alert and oriented. No focal deficits. Extremities: no edema, no cyanosis    The results of significant diagnostics from this hospitalization (including imaging, microbiology, ancillary and laboratory) are listed below for reference.      Microbiology:   Recent Results (from the past 240 hour(s))  Urine culture     Status: Abnormal   Collection Time: 08/11/19  1:13 AM   Specimen: Urine, Catheterized  Result Value Ref Range Status   Specimen Description   Final    URINE, CATHETERIZED Performed at [facility], [address] [place], [place] [address]    Special Requests   Final    NONE Performed at [facility], [address] [place], [place] [address]    Culture MULTIPLE SPECIES PRESENT, SUGGEST RECOLLECTION (A)  Final   Report Status 08/12/2019 FINAL  Final  SARS CORONAVIRUS 2 (TAT 6-24 HRS) Nasopharyngeal Nasopharyngeal Swab     Status: None   Collection Time: 08/11/19  2:08 AM   Specimen: Nasopharyngeal Swab  Result Value Ref Range Status   SARS Coronavirus 2 NEGATIVE NEGATIVE Final    Comment: (NOTE) SARS-CoV-2 target nucleic acids are NOT DETECTED. The SARS-CoV-2 RNA is generally detectable in upper and lower respiratory specimens during the acute phase of infection. Negative results do not preclude SARS-CoV-2 infection, do not rule out co-infections with other pathogens, and should not be used as the sole basis for treatment or other patient management decisions. Negative results must be combined with clinical observations, patient history, and epidemiological information. The expected result is Negative. Fact Sheet for Patients: SugarRoll.be Fact Sheet for Healthcare Providers: https://www.woods-mathews.com/ This test is not yet approved or cleared by  the [place] FDA and  has been authorized for detection and/or diagnosis of SARS-CoV-2 by FDA under an Emergency Use Authorization (EUA). This EUA will remain  in effect (meaning this test can be used) for the duration of the COVID-19 declaration under Section 56 4(b)(1) of the Act, 21 U.S.C. section 360bbb-3(b)(1), unless the authorization is terminated or revoked sooner. Performed at [facility], [place] [address], [place], [place] [address]   Culture, blood (Routine X 2) w Reflex to ID Panel     Status: None   Collection Time: 08/11/19  8:36 AM   Specimen: BLOOD LEFT HAND  Result Value Ref Range Status   Specimen Description BLOOD LEFT HAND  Final   Special Requests   Final    BOTTLES DRAWN AEROBIC ONLY Blood Culture results may not be optimal due to an inadequate volume of blood received in culture bottles   Culture   Final    NO GROWTH 5 DAYS Performed at [facility], [place] [address] [place], [place] [address]    Report Status 08/16/2019 FINAL  Final  Culture, blood (Routine X 2) w Reflex to ID Panel     Status: None   Collection Time: 08/11/19  9:30 AM   Specimen: BLOOD LEFT HAND  Result Value Ref Range Status   Specimen Description BLOOD LEFT HAND  Final   Special Requests   Final    BOTTLES DRAWN AEROBIC ONLY Blood Culture results may not be optimal due to an inadequate volume of blood received in culture bottles   Culture   Final    NO GROWTH 5 DAYS Performed at [facility], [place]  [address], [place], [place] [address]    Report Status 08/16/2019 FINAL  Final  SARS Coronavirus 2 by RT PCR (hospital order, performed in [facility] hospital lab) Nasopharyngeal Nasopharyngeal Swab     Status: None   Collection Time: 08/11/19 10:23 AM   Specimen: Nasopharyngeal Swab  Result Value Ref Range Status   SARS Coronavirus 2 NEGATIVE NEGATIVE Final    Comment: (NOTE) If result is NEGATIVE SARS-CoV-2 target nucleic acids are NOT DETECTED. The SARS-CoV-2 RNA  is generally detectable in upper and lower  respiratory specimens during the acute phase of infection. The lowest  concentration of SARS-CoV-2 viral copies this assay can detect is 250  copies / mL. A negative result does not preclude SARS-CoV-2 infection  and should not be used as the sole basis for treatment or other  patient management decisions.  A negative result may occur with  improper specimen collection / handling, submission of specimen other  than nasopharyngeal swab, presence of viral mutation(s) within the  areas targeted by this assay, and inadequate number of viral copies  (<250 copies / mL). A negative result must be combined with clinical  observations, patient history, and epidemiological information. If result is POSITIVE SARS-CoV-2 target nucleic acids are DETECTED. The SARS-CoV-2 RNA is generally detectable in upper and lower  respiratory specimens dur ing the acute phase of infection.  Positive  results are indicative of active infection with SARS-CoV-2.  Clinical  correlation with patient history and other diagnostic information is  necessary to determine patient infection status.  Positive results do  not rule out bacterial infection or co-infection with other viruses. If result is PRESUMPTIVE POSTIVE SARS-CoV-2 nucleic acids MAY BE PRESENT.   A presumptive positive result was obtained on the submitted specimen  and confirmed on repeat testing.  While 2019 novel coronavirus  (SARS-CoV-2) nucleic acids may be present in the submitted sample  additional confirmatory testing may be necessary for epidemiological  and / or clinical management purposes  to differentiate between  SARS-CoV-2 and other Sarbecovirus currently known to infect humans.  If clinically indicated additional testing with an alternate test  methodology [id number]) is advised. The SARS-CoV-2 RNA is generally  detectable in upper and lower respiratory sp ecimens during the acute  phase of  infection. The expected result is Negative. Fact Sheet for Patients:  StrictlyIdeas.no Fact Sheet for Healthcare Providers: BankingDealers.co.za This test is not yet approved or cleared by the [place] FDA and has been authorized for detection and/or diagnosis of SARS-CoV-2 by FDA under an Emergency Use Authorization (EUA).  This EUA will remain in effect (meaning this test can be used) for the duration of the COVID-19 declaration under Section 564(b)(1) of the Act, 21 U.S.C. section 360bbb-3(b)(1), unless the authorization is terminated or revoked sooner. Performed at [facility], [place] [address] [place], [place] [address]   MRSA PCR Screening     Status: None   Collection Time: 08/11/19  3:01 PM   Specimen: Nasal Mucosa; Nasopharyngeal  Result Value Ref Range Status   MRSA by PCR NEGATIVE NEGATIVE Final    Comment:        The GeneXpert MRSA Assay (FDA approved for NASAL specimens only), is one component of a comprehensive MRSA colonization surveillance program. It is not intended to diagnose MRSA infection nor to guide or monitor treatment for MRSA infections. Performed at [facility], [place] [address] [place], [place] [address]   Culture, Urine     Status: Abnormal  Collection Time: 08/18/19  4:14 PM   Specimen: Urine, Random  Result Value Ref Range Status   Specimen Description URINE, RANDOM  Final   Special Requests   Final    NONE Performed at [facility], [address], [place], [place] [address]    Culture >=100,000 COLONIES/mL PSEUDOMONAS AERUGINOSA (A)  Final   Report Status 08/20/2019 FINAL  Final   Organism ID, Bacteria PSEUDOMONAS AERUGINOSA (A)  Final      Susceptibility   Pseudomonas aeruginosa - MIC*    CEFTAZIDIME 4 SENSITIVE Sensitive     CIPROFLOXACIN <=0.25 SENSITIVE Sensitive     GENTAMICIN 2 SENSITIVE Sensitive     IMIPENEM 1 SENSITIVE Sensitive     PIP/TAZO 8 SENSITIVE  Sensitive     CEFEPIME 2 SENSITIVE Sensitive     * >=100,000 COLONIES/mL PSEUDOMONAS AERUGINOSA     Labs:   CBC: Recent Labs  Lab 08/15/19 0330 08/16/19 0443  WBC 7.7 7.5  HGB 9.0* 9.0*  HCT 29.6* 29.1*  MCV 97.7 96.7  PLT 257 518   Basic Metabolic Panel: Recent Labs  Lab 08/15/19 0330 08/16/19 0443 08/17/19 0346 08/18/19 0422 08/19/19 0654  NA 139 138 141 141 141  K 3.3* 4.0 4.1 3.4* 3.5  CL 107 109 111 110 109  CO2 21* 22 20* 22 21*  GLUCOSE 98 96 79 81 84  BUN 15 14 16 16 10   CREATININE 1.49* 1.46* 1.40* 1.51* 1.46*  CALCIUM 8.3* 8.4* 8.6* 9.0 9.2   Liver Function Tests: No results for input(s): AST, ALT, ALKPHOS, BILITOT, PROT, ALBUMIN in the last 168 hours. BNP (last 3 results) No results for input(s): BNP in the last 8760 hours. Cardiac Enzymes: No results for input(s): CKTOTAL, CKMB, CKMBINDEX, TROPONINI in the last 168 hours. CBG: Recent Labs  Lab 08/13/19 1601 08/13/19 1956 08/14/19 0005 08/14/19 0346 08/14/19 1212  GLUCAP 135* 124* 105* 104* 88   Urinalysis    Component Value Date/Time   COLORURINE STRAW (A) 08/11/2019 0113   APPEARANCEUR HAZY (A) 08/11/2019 0113   LABSPEC 1.015 08/11/2019 0113   PHURINE 8.0 08/11/2019 0113   GLUCOSEU NEGATIVE 08/11/2019 0113   HGBUR TRACE (A) 08/11/2019 0113   BILIRUBINUR NEGATIVE 08/11/2019 0113   KETONESUR 15 (A) 08/11/2019 0113   PROTEINUR 100 (A) 08/11/2019 0113   NITRITE NEGATIVE 08/11/2019 0113   LEUKOCYTESUR SMALL (A) 08/11/2019 0113    Time coordinating discharge: Over 50  minutes  SIGNED: Deatra James, MD, FACP, [facility]. [facility],  Pager [phone]  If 7PM-7AM, please contact night-coverage [email or web address], Password [facility] 08/20/2019, 11:20 AM

## 2019-08-20 NOTE — TOC Transition Note (Signed)
Transition of Care [facility]) - CM/SW Discharge Note   Patient Details  Name: Jeanne Bruce MRN: [id number] Date of Birth: 1955/11/26  Transition of [facility]) CM/SW Contact:  Bethena Roys, RN Phone Number: 08/20/2019, 11:42 AM   Clinical Narrative:   Received call from [place] Admissions Coordinator- husband does not want to pursue insurance authorization for CIR. Plan is now to return home without any [facility]. Husband wants to take wife to outpatient therapy for Physical and Occupational Therapy. CM did speak with physician and Rx was  written for outpatient PT/OT evaluation and treatment. Patient will be able to take Rx to therapy of choice. Per husband patient will have adequate transportation. No further needs from CM at this time.    Final next level of care: Home/Self Care(Husband declining Inpatient Rehab- patient wants to return home) Barriers to Discharge: No Barriers Identified   Patient Goals and CMS Choice     Choice offered to / list presented to : NA   Discharge Plan and Services In-house Referral: NA Discharge Planning Services: CM Consult Post Acute Care Choice: NA               Readmission Risk Interventions No flowsheet data found.

## 2019-08-20 NOTE — Progress Notes (Signed)
Inpatient Rehabilitation Admissions Coordinator  I spoke with pt's spouse by phone and he states they wish to be discharged home today, not continue to pursue insurance approval for CIR admit. I have notified RN CM, Hassan Rowan, and we will sign off at this time.  Danne Baxter, RN, MSN Rehab Admissions Coordinator [phone] 08/20/2019 7:48 AM

## 2019-08-20 NOTE — Progress Notes (Signed)
  Speech Language Pathology Treatment: Cognitive-Linquistic  Patient Details Name: Jeanne Bruce MRN: [id number] DOB: 03/23/1956 Today's Date: 08/20/2019 Time: 9611-6435 SLP Time Calculation (min) (ACUTE ONLY): 15 min  Assessment / Plan / Recommendation Clinical Impression  Pt feels that she is at her baseline and has no concerns about ability to complete cognitive tasks on d/c, planned for today.  Pt did not recall memory strategies introduced at last session.  SLP reviewed strategies and provided handout.  After >27mn delay pt was able to recall 3 of 4 strategies independently.  Pt complete functional memory task with 80% accuracy for immediate recall, and 100% accuracy with 1 and 5 minute delay. Pt has no further acute needs.  SLP will sign off at this time.   HPI HPI: 63year old woman with a history of MS, colon cancer with colostomy, recent right nephrectomy for renal abscess (discharged mid October).  Presented with progressive weakness, lethargy and then acute focal neurological signs concerning for possible stroke.  She had a paradoxical reaction to low-dose lorazepam, resulted in respiratory depression and a VF arrest.  She underwent brief CPR, required intubation mechanical ventilation, 10/26, extubated 10/27.  MRI negative.       SLP Plan  All goals met       Recommendations            Follow up Recommendations: None SLP Visit Diagnosis: Cognitive communication deficit ((T91.225 Plan: All goals met       [place] CLindaleOffice: [phone]/01/2019, 10:23 AM

## 2019-09-02 DIAGNOSIS — I5022 Chronic systolic (congestive) heart failure: Secondary | ICD-10-CM | POA: Insufficient documentation

## 2019-09-02 DIAGNOSIS — I5181 Takotsubo syndrome: Secondary | ICD-10-CM | POA: Insufficient documentation

## 2019-09-02 NOTE — Progress Notes (Signed)
Virtual Visit via Telephone Note   This visit type was conducted due to national recommendations for restrictions regarding the COVID-19 Pandemic (e.g. social distancing) in an effort to limit this patient's exposure and mitigate transmission in our community.  Due to her co-morbid illnesses, this patient is at least at moderate risk for complications without adequate follow up.  This format is felt to be most appropriate for this patient at this time.  The patient did not have access to video technology/had technical difficulties with video requiring transitioning to audio format only (telephone).  All issues noted in this document were discussed and addressed.  No physical exam could be performed with this format.  Please refer to the patient's chart for her  consent to telehealth for [facility]   Date:  09/03/2019   ID:  Jeanne Bruce, DOB 09-26-1956, MRN [id number]  Patient Location: Home Provider Location: Home  PCP:  Derrill Center., MD  Cardiologist:  Fransico Him, MD  Electrophysiologist:  None   Evaluation Performed:  Follow-Up Visit  Chief Complaint:  Follow up  History of Present Illness:    Jeanne Bruce is a 63 y.o. female   who was admitted to the hospital 07/2019 with urosepsis and septic encephalopathy.  She had brief respiratory arrest and V. fib arrest in the ED after receiving low-dose lorazepam.  She was intubated and resuscitated with one round of CPR.  No defibrillation needed.  2D echo showed EF of 25% with apical ballooning.  Cardiac catheterization showed normal coronary arteries and she was felt to have Takotsubo syndrome.  She was treated with hydralazine, Imdur and carvedilol.  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  Patient also has a history of hypertension, multiple sclerosis, history of colon cancer status post colectomy/colostomy.   Patient denies chest pain, palpitations, dyspnea, dyspnea on  exertion, dizziness, or presyncope. Has ankle edema that is chronic and not changed. Ran out of carvedilol-doesn't know when. BP running low. Losing weight-was 168 lbs in March and now 116 lbs.   The patient does not have symptoms concerning for COVID-19 infection (fever, chills, cough, or new shortness of breath).    Past Medical History:  Diagnosis Date   Cancer ([place])    colon (resolved)   Hypertension    Multiple sclerosis ([place])    Renal disorder    Past Surgical History:  Procedure Laterality Date   LEFT HEART CATH AND CORONARY ANGIOGRAPHY N/A 08/14/2019   Procedure: LEFT HEART CATH AND CORONARY ANGIOGRAPHY;  Surgeon: Burnell Blanks, MD;  Location: [place] [facility];  Service: Cardiovascular;  Laterality: N/A;   NEPHRECTOMY TRANSPLANTED ORGAN       Current Meds  Medication Sig   calcium-vitamin D (OSCAL WITH D) 500-200 MG-UNIT tablet Take 1 tablet by mouth.   carvedilol (COREG) 6.25 MG tablet Take 1 tablet (6.25 mg total) by mouth 2 (two) times daily with a meal.   conjugated estrogens (PREMARIN) vaginal cream Place 1 Applicatorful vaginally at bedtime.   escitalopram (LEXAPRO) 10 MG tablet Take 10 mg by mouth daily.   feeding supplement, ENSURE ENLIVE, (ENSURE ENLIVE) LIQD Take 237 mLs by mouth 2 (two) times daily between meals.   hydrALAZINE (APRESOLINE) 25 MG tablet Take 1 tablet (25 mg total) by mouth every 8 (eight) hours.   isosorbide mononitrate (IMDUR) 30 MG 24 hr tablet Take 0.5 tablets (15 mg total) by mouth daily.   oxyCODONE (OXY IR/ROXICODONE) 5 MG  immediate release tablet Take 5 mg by mouth every 4 (four) hours as needed for pain.   potassium chloride SA (KLOR-CON) 20 MEQ tablet Take 20 mEq by mouth 2 (two) times daily.   Probiotic Product (PROBIOTIC DAILY PO) Take 1 capsule by mouth daily.     Allergies:   Ativan [lorazepam], Invanz [ertapenem], Mirabegron, Claritin-d 12 hour [loratadine-pseudoephedrine er], and Penicillins   Social  History   Tobacco Use   Smoking status: Never Smoker   Smokeless tobacco: Never Used  Substance Use Topics   Alcohol use: Not Currently   Drug use: Never     Family Hx: The patient's family history is negative for Diabetes Mellitus II.  ROS:   Please see the history of present illness.      All other systems reviewed and are negative.   Prior CV studies:   The following studies were reviewed today: 2D echo 08/11/2019  IMPRESSIONS      1. Left ventricular ejection fraction, by visual estimation, is 20 to 25%. The left ventricle has severely decreased function. Normal left ventricular size. There is no left ventricular hypertrophy.  2. Definity contrast agent was given IV to delineate the left ventricular endocardial borders.  3. Left ventricular diastolic Doppler parameters are consistent with impaired relaxation pattern of LV diastolic filling.  4. LVEF is severely depressed with diffuse hypokinesis; septal and apical akinesis.  5. Global right ventricle has normal systolic function.The right ventricular size is normal. No increase in right ventricular wall thickness.  6. Left atrial size was normal.  7. Right atrial size was normal.  8. The mitral valve is grossly normal. Trace mitral valve regurgitation.  9. The tricuspid valve is normal in structure. Tricuspid valve regurgitation is trivial. 10. The aortic valve is normal in structure. Aortic valve regurgitation is trivial by color flow Doppler. 11. The pulmonic valve was not well visualized. Pulmonic valve regurgitation is not visualized by color flow Doppler.   FINDINGS  Left Ventricle: Left ventricular ejection fraction, by visual estimation, is 20 to 25%. The left ventricle has severely decreased function. Definity contrast agent was given IV to delineate the left ventricular endocardial borders. There is no left  ventricular hypertrophy. Normal left ventricular size. Spectral Doppler shows Left ventricular diastolic  Doppler parameters are consistent with impaired relaxation pattern of LV diastolic filling. LVEF is severely depressed with diffuse hypokinesis;  septal and apical akinesis.   Right Ventricle: The right ventricular size is normal. No increase in right ventricular wall thickness. Global RV systolic function is has normal systolic function.   Left Atrium: Left atrial size was normal in size.   Right Atrium: Right atrial size was normal in size   Pericardium: There is no evidence of pericardial effusion.   Mitral Valve: The mitral valve is grossly normal. There is mild thickening of the mitral valve leaflet(s). Trace mitral valve regurgitation.   Tricuspid Valve: The tricuspid valve is normal in structure. Tricuspid valve regurgitation is trivial by color flow Doppler.   Aortic Valve: The aortic valve is normal in structure. Aortic valve regurgitation is trivial by color flow Doppler.   Pulmonic Valve: The pulmonic valve was not well visualized. Pulmonic valve regurgitation is not visualized by color flow Doppler.   Aorta: The aortic root is normal in size and structure.   IAS/Shunts: No atrial level shunt detected by color flow Doppler.    Cardiac catheterization 08/14/2019 1. No angiographic evidence of CAD 2. Non-ischemic cardiomyopathy      Labs/Other  Tests and Data Reviewed:    EKG:  An ECG dated 08/14/19 was personally reviewed today and demonstrated:  NSR with LBBB  Recent Labs: 08/12/2019: Magnesium 2.3 08/13/2019: ALT 27 08/16/2019: Hemoglobin 9.0; Platelets 237 08/19/2019: BUN 10; Creatinine, Ser 1.46; Potassium 3.5; Sodium 141   Recent Lipid Panel Lab Results  Component Value Date/Time   CHOL 160 08/11/2019 08:30 AM   TRIG 165 (H) 08/11/2019 08:30 AM   HDL 35 (L) 08/11/2019 08:30 AM   CHOLHDL 4.6 08/11/2019 08:30 AM   LDLCALC 92 08/11/2019 08:30 AM    Wt Readings from Last 3 Encounters:  09/03/19 116 lb (52.6 kg)  08/20/19 122 lb 6.4 oz (55.5 kg)      Objective:    Vital Signs:  BP (!) 103/59    Pulse (!) 101    Ht 5\' 2"  (1.575 m)    Wt 116 lb (52.6 kg)    SpO2 100%    BMI 21.22 kg/m    VITAL SIGNS:  reviewed  ASSESSMENT & PLAN:    1. Takotsubo cardiomyopathy ejection fraction 25% on echo 07/2019, normal coronary arteries on cardiac catheterization 08/14/2019.  Patient ran out of her carvedilol and pulses 101 today.  She does not know when she ran out.  Will restart carvedilol.  Blood pressure running low.  Will reduce hydralazine to half a tablet 3 times daily.  Follow-up with me in 2 weeks to reassess.  Ask home health to go out and draw blood work and assess for heart failure. 2. Chronic systolic CHF-has chronic ankle edema that is unchanged otherwise seems compensated. 3. Essential hypertension blood pressure running quite low. 4. Urosepsis with septic encephalopathy with brief respiratory arrest of V. fib arrest 07/2019 5. 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   The importance of social distancing was discussed today.  Time:   Today, I have spent 12:32 minutes with the patient with telehealth technology discussing the above problems.     Medication Adjustments/Labs and Tests Ordered: Current medicines are reviewed at length with the patient today.  Concerns regarding medicines are outlined above.   Tests Ordered: No orders of the defined types were placed in this encounter.   Medication Changes: No orders of the defined types were placed in this encounter.   Follow Up:  Virtual Visit  in 2 week(s) Ermalinda Barrios, PA-C  Signed, Ermalinda Barrios, PA-C  09/03/2019 9:00 AM    [place]

## 2019-09-03 ENCOUNTER — Other Ambulatory Visit: Payer: Self-pay | Admitting: Physician Assistant

## 2019-09-03 ENCOUNTER — Other Ambulatory Visit: Payer: Self-pay

## 2019-09-03 ENCOUNTER — Telehealth (INDEPENDENT_AMBULATORY_CARE_PROVIDER_SITE_OTHER): Payer: BC Managed Care – PPO | Admitting: Physician Assistant

## 2019-09-03 ENCOUNTER — Telehealth: Payer: Self-pay

## 2019-09-03 ENCOUNTER — Encounter: Payer: Self-pay | Admitting: Physician Assistant

## 2019-09-03 VITALS — BP 103/59 | HR 101 | Ht 62.0 in | Wt 116.0 lb

## 2019-09-03 DIAGNOSIS — I10 Essential (primary) hypertension: Secondary | ICD-10-CM | POA: Diagnosis not present

## 2019-09-03 DIAGNOSIS — N184 Chronic kidney disease, stage 4 (severe): Secondary | ICD-10-CM

## 2019-09-03 DIAGNOSIS — I5181 Takotsubo syndrome: Secondary | ICD-10-CM | POA: Diagnosis not present

## 2019-09-03 DIAGNOSIS — I4901 Ventricular fibrillation: Secondary | ICD-10-CM | POA: Diagnosis not present

## 2019-09-03 DIAGNOSIS — I5022 Chronic systolic (congestive) heart failure: Secondary | ICD-10-CM

## 2019-09-03 MED ORDER — CARVEDILOL 6.25 MG PO TABS: 6.2500 mg | ORAL_TABLET | Freq: Two times a day (BID) | ORAL | 3 refills | Status: DC

## 2019-09-03 MED ORDER — ISOSORBIDE MONONITRATE ER 30 MG PO TB24: 15.0000 mg | ORAL_TABLET | Freq: Every day | ORAL | 3 refills | Status: DC

## 2019-09-03 MED ORDER — HYDRALAZINE HCL 25 MG PO TABS: 12.5000 mg | ORAL_TABLET | Freq: Three times a day (TID) | ORAL | 3 refills | Status: DC

## 2019-09-03 NOTE — Patient Instructions (Signed)
Medication Instructions:  Your physician has recommended you make the following change in your medication:   1. RESTART: carvedilol (coreg) 6.25 mg tablet: Take 1 tablet by mouth twice a day  2. DECREASE: hydralazine 25 mg tablet: Take 1/2 tablet by mouth three times a day   If you need a refill on your cardiac medications before your next appointment, please call your pharmacy.   Lab work: We will arrange for Home Health to come out next week for lab work Artist) and to assess your heart failure symptoms  If you have labs (blood work) drawn today and your tests are completely normal, you will receive your results only by: Marland Kitchen MyChart Message (if you have MyChart) OR . A paper copy in the mail If you have any lab test that is abnormal or we need to change your treatment, we will call you to review the results.  Testing/Procedures: None ordered  Follow-Up: . Follow up with Ermalinda Barrios, PA via VIRTUAL Visit on 09/16/19 at 12:00 PM  Any Other Special Instructions Will Be Listed Below (If Applicable).  1. Your physician has requested that you regularly monitor and record your blood pressure readings at home. Please use the same machine at the same time of day to check your readings and keep a record.  2. Your physician recommends that you weigh, daily, at the same time every day, and in the same amount of clothing. Please record your daily weights. Call if you have more than a 2-3 lb weight gain overnight or if you gain 5 lbs in 1 week.  Two Gram Sodium Diet 2000 mg  What is Sodium? Sodium is a mineral found naturally in many foods. The most significant source of sodium in the diet is table salt, which is about 40% sodium.  Processed, convenience, and preserved foods also contain a large amount of sodium.  The body needs only 500 mg of sodium daily to function,  A normal diet provides more than enough sodium even if you do not use salt.  Why Limit Sodium? A build up of sodium in the body  can cause thirst, increased blood pressure, shortness of breath, and water retention.  Decreasing sodium in the diet can reduce edema and risk of heart attack or stroke associated with high blood pressure.  Keep in mind that there are many other factors involved in these health problems.  Heredity, obesity, lack of exercise, cigarette smoking, stress and what you eat all play a role.  General Guidelines:  Do not add salt at the table or in cooking.  One teaspoon of salt contains over 2 grams of sodium.  Read food labels  Avoid processed and convenience foods  Ask your dietitian before eating any foods not dicussed in the menu planning guidelines  Consult your physician if you wish to use a salt substitute or a sodium containing medication such as antacids.  Limit milk and milk products to 16 oz (2 cups) per day.  Shopping Hints:  READ LABELS!! "Dietetic" does not necessarily mean low sodium.  Salt and other sodium ingredients are often added to foods during processing.   Menu Planning Guidelines Food Group Choose More Often Avoid  Beverages (see also the milk group All fruit juices, low-sodium, salt-free vegetables juices, low-sodium carbonated beverages Regular vegetable or tomato juices, commercially softened water used for drinking or cooking  Breads and Cereals Enriched white, wheat, rye and pumpernickel bread, hard rolls and dinner rolls; muffins, cornbread and waffles; most dry cereals,  cooked cereal without added salt; unsalted crackers and breadsticks; low sodium or homemade bread crumbs Bread, rolls and crackers with salted tops; quick breads; instant hot cereals; pancakes; commercial bread stuffing; self-rising flower and biscuit mixes; regular bread crumbs or cracker crumbs  Desserts and Sweets Desserts and sweets mad with mild should be within allowance Instant pudding mixes and cake mixes  Fats Butter or margarine; vegetable oils; unsalted salad dressings, regular salad  dressings limited to 1 Tbs; light, sour and heavy cream Regular salad dressings containing bacon fat, bacon bits, and salt pork; snack dips made with instant soup mixes or processed cheese; salted nuts  Fruits Most fresh, frozen and canned fruits Fruits processed with salt or sodium-containing ingredient (some dried fruits are processed with sodium sulfites        Vegetables Fresh, frozen vegetables and low- sodium canned vegetables Regular canned vegetables, sauerkraut, pickled vegetables, and others prepared in brine; frozen vegetables in sauces; vegetables seasoned with ham, bacon or salt pork  Condiments, Sauces, Miscellaneous  Salt substitute with physician's approval; pepper, herbs, spices; vinegar, lemon or lime juice; hot pepper sauce; garlic powder, onion powder, low sodium soy sauce (1 Tbs.); low sodium condiments (ketchup, chili sauce, mustard) in limited amounts (1 tsp.) fresh ground horseradish; unsalted tortilla chips, pretzels, potato chips, popcorn, salsa (1/4 cup) Any seasoning made with salt including garlic salt, celery salt, onion salt, and seasoned salt; sea salt, rock salt, kosher salt; meat tenderizers; monosodium glutamate; mustard, regular soy sauce, barbecue, sauce, chili sauce, teriyaki sauce, steak sauce, Worcestershire sauce, and most flavored vinegars; canned gravy and mixes; regular condiments; salted snack foods, olives, picles, relish, horseradish sauce, catsup   Food preparation: Try these seasonings Meats:    Pork Sage, onion Serve with applesauce  Chicken Poultry seasoning, thyme, parsley Serve with cranberry sauce  Lamb Curry powder, rosemary, garlic, thyme Serve with mint sauce or jelly  Veal Marjoram, basil Serve with current jelly, cranberry sauce  Beef Pepper, bay leaf Serve with dry mustard, unsalted chive butter  Fish [place] leaf, dill Serve with unsalted lemon butter, unsalted parsley butter  Vegetables:    Asparagus Lemon juice   Broccoli Lemon juice    Carrots Mustard dressing parsley, mint, nutmeg, glazed with unsalted butter and sugar   Green beans Marjoram, lemon juice, nutmeg,dill seed   Tomatoes Basil, marjoram, onion   Spice /blend for [employer]" 4 tsp ground thyme 1 tsp ground sage 3 tsp ground rosemary 4 tsp ground marjoram   Test your knowledge 1. A product that says "Salt Free" may still contain sodium. True or False 2. Garlic Powder and Hot Pepper Sauce an be used as alternative seasonings.True or False 3. Processed foods have more sodium than fresh foods.  True or False 4. Canned Vegetables have less sodium than froze True or False  WAYS TO DECREASE YOUR SODIUM INTAKE 1. Avoid the use of added salt in cooking and at the table.  Table salt (and other prepared seasonings which contain salt) is probably one of the greatest sources of sodium in the diet.  Unsalted foods can gain flavor from the sweet, sour, and butter taste sensations of herbs and spices.  Instead of using salt for seasoning, try the following seasonings with the foods listed.  Remember: how you use them to enhance natural food flavors is limited only by your creativity... Allspice-Meat, fish, eggs, fruit, peas, red and yellow vegetables Almond Extract-Fruit baked goods Anise Seed-Sweet breads, fruit, carrots, beets, cottage cheese, cookies (tastes like licorice)  Basil-Meat, fish, eggs, vegetables, rice, vegetables salads, soups, sauces Bay Leaf-Meat, fish, stews, poultry Burnet-Salad, vegetables (cucumber-like flavor) Caraway Seed-Bread, cookies, cottage cheese, meat, vegetables, cheese, rice Cardamon-Baked goods, fruit, soups Celery Powder or seed-Salads, salad dressings, sauces, meatloaf, soup, bread.Do not use  celery salt Chervil-Meats, salads, fish, eggs, vegetables, cottage cheese (parsley-like flavor) Chili Power-Meatloaf, chicken cheese, corn, eggplant, egg dishes Chives-Salads cottage cheese, egg dishes, soups, vegetables, sauces Cilantro-Salsa,  casseroles Cinnamon-Baked goods, fruit, pork, lamb, chicken, carrots Cloves-Fruit, baked goods, fish, pot roast, green beans, beets, carrots Coriander-Pastry, cookies, meat, salads, cheese (lemon-orange flavor) Cumin-Meatloaf, fish,cheese, eggs, cabbage,fruit pie (caraway flavor) [employer], fruit, eggs, fish, poultry, cottage cheese, vegetables Dill Seed-Meat, cottage cheese, poultry, vegetables, fish, salads, bread Fennel Seed-Bread, cookies, apples, pork, eggs, fish, beets, cabbage, cheese, Licorice-like flavor Garlic-(buds or powder) Salads, meat, poultry, fish, bread, butter, vegetables, potatoes.Do not  use garlic salt Ginger-Fruit, vegetables, baked goods, meat, fish, poultry Horseradish Root-Meet, vegetables, butter Lemon Juice or Extract-Vegetables, fruit, tea, baked goods, fish salads Mace-Baked goods fruit, vegetables, fish, poultry (taste like nutmeg) Maple Extract-Syrups Marjoram-Meat, chicken, fish, vegetables, breads, green salads (taste like Sage) Mint-Tea, lamb, sherbet, vegetables, desserts, carrots, cabbage Mustard, Dry or Seed-Cheese, eggs, meats, vegetables, poultry Nutmeg-Baked goods, fruit, chicken, eggs, vegetables, desserts Onion Powder-Meat, fish, poultry, vegetables, cheese, eggs, bread, rice salads (Do not use   Onion salt) Orange Extract-Desserts, baked goods Oregano-Pasta, eggs, cheese, onions, pork, lamb, fish, chicken, vegetables, green salads Paprika-Meat, fish, poultry, eggs, cheese, vegetables Parsley Flakes-Butter, vegetables, meat fish, poultry, eggs, bread, salads (certain forms may   Contain sodium Pepper-Meat fish, poultry, vegetables, eggs Peppermint Extract-Desserts, baked goods Poppy Seed-Eggs, bread, cheese, fruit dressings, baked goods, noodles, vegetables, cottage  [employer], poultry, meat, fish, cauliflower, turnips,eggs bread Saffron-Rice, bread, veal, chicken, fish, eggs Sage-Meat, fish,  poultry, onions, eggplant, tomateos, pork, stews Savory-Eggs, salads, poultry, meat, rice, vegetables, soups, pork Tarragon-Meat, poultry, fish, eggs, butter, vegetables (licorice-like flavor)  Thyme-Meat, poultry, fish, eggs, vegetables, (clover-like flavor), sauces, soups Tumeric-Salads, butter, eggs, fish, rice, vegetables (saffron-like flavor) Vanilla Extract-Baked goods, candy Vinegar-Salads, vegetables, meat marinades Walnut Extract-baked goods, candy  2. Choose your Foods Wisely   The following is a list of foods to avoid which are high in sodium:  Meats-Avoid all smoked, canned, salt cured, dried and kosher meat and fish as well as Anchovies   [employer] meats:Bologna, Liverwurst, Pastrami Canned meat or fish  Marinated herring Caviar    Pepperoni Corned Beef   Pizza Dried chipped beef  Salami Frozen breaded fish or meat Salt pork Frankfurters or hot dogs  Sardines Gefilte fish   Sausage Ham (boiled ham, Proscuitto Smoked butt    spiced ham)   Spam      TV Dinners Vegetables Canned vegetables (Regular) Relish Canned mushrooms  Sauerkraut Olives    Tomato juice Pickles  Bakery and Dessert Products Canned puddings  Cream pies Cheesecake   Decorated cakes Cookies  Beverages/Juices Tomato juice, regular  Gatorade   V-8 vegetable juice, regular  Breads and Cereals Biscuit mixes   Salted potato chips, corn chips, pretzels Bread stuffing mixes  Salted crackers and rolls Pancake and waffle mixes Self-rising flour  Seasonings Accent    Meat sauces Barbecue sauce  Meat tenderizer Catsup    Monosodium glutamate (MSG) Celery salt   Onion salt Chili sauce   Prepared mustard Garlic salt   Salt, seasoned salt, sea salt Gravy mixes   Soy sauce Horseradish   Steak sauce Ketchup  Tartar sauce Lite salt    Teriyaki sauce Marinade mixes   Worcestershire sauce  Others Baking powder   Cocoa and cocoa mixes Baking soda   Commercial casserole  mixes Candy-caramels, chocolate  Dehydrated soups    Bars, fudge,nougats  Instant rice and pasta mixes Canned broth or soup  Maraschino cherries Cheese, aged and processed cheese and cheese spreads  Learning Assessment Quiz  Indicated T (for True) or F (for False) for each of the following statements:  1. _____ Fresh fruits and vegetables and unprocessed grains are generally low in sodium 2. _____ Water may contain a considerable amount of sodium, depending on the source 3. _____ You can always tell if a food is high in sodium by tasting it 4. _____ Certain laxatives my be high in sodium and should be avoided unless prescribed   by a physician or pharmacist 5. _____ Salt substitutes may be used freely by anyone on a sodium restricted diet 6. _____ Sodium is present in table salt, food additives and as a natural component of   most foods 7. _____ Table salt is approximately 90% sodium 8. _____ Limiting sodium intake may help prevent excess fluid accumulation in the body 9. _____ On a sodium-restricted diet, seasonings such as bouillon soy sauce, and    cooking wine should be used in place of table salt 10. _____ On an ingredient list, a product which lists monosodium glutamate as the first   ingredient is an appropriate food to include on a low sodium diet  Circle the best answer(s) to the following statements (Hint: there may be more than one correct answer)  11. On a low-sodium diet, some acceptable snack items are:    A. Olives  F. Bean dip   K. Grapefruit juice    B. Salted Pretzels G. Commercial Popcorn   L. Canned peaches    C. Carrot Sticks  H. Bouillon   M. Unsalted nuts   D. [place] fries  I. Peanut butter crackers N. Salami   E. Sweet pickles J. Tomato Juice   O. Pizza  12.  Seasonings that may be used freely on a reduced - sodium diet include   A. Lemon wedges F.Monosodium glutamate K. Celery seed    B.Soysauce   G. Pepper   L. Mustard powder   C. Sea salt  H.  Cooking wine  M. Onion flakes   D. Vinegar  E. Prepared horseradish N. Salsa   E. Sage   J. Worcestershire sauce  O. Chutney

## 2019-09-03 NOTE — Telephone Encounter (Signed)
[facility] Visit Initial Request  Date of Request ([place]):  September 03, 2019  Requesting Provider:  Ermalinda Barrios, PA    Agency Requested:    [facility] Contact:  Glory Buff, NP [address] [place], [place] [address] Phone #:  [phone] Fax #:  [phone]  Patient Demographic Information: Name:  Julyssa Kyer Age:  63 y.o.   DOB:  09/07/1956  MRN:  [id number]   Address:   [place] [address]   Phone Numbers:   Home Phone [phone]  Mobile [phone]     Emergency Contact Information on File:   Contact Information    Name Relation Home Work Mobile   [place] Daughter   [phone]   [facility] Spouse   [phone]      The above family members may be contacted for information on this patient (review DPR on file):  Yes    Patient Clinical Information:  Primary Care Provider:  Derrill Center., MD  Primary Cardiologist:  Fransico Him, MD  Primary Electrophysiologist:  None   Past Medical Hx: Ms. Gillentine  has a past medical history of Cancer [facility]), Hypertension, Multiple sclerosis ([place]), and Renal disorder.   Allergies: She is allergic to ativan [lorazepam]; invanz [ertapenem]; mirabegron; claritin-d 12 hour [loratadine-pseudoephedrine er]; and penicillins.   Medications: Current Outpatient Medications on File Prior to Visit  Medication Sig  . calcium-vitamin D (OSCAL WITH D) 500-200 MG-UNIT tablet Take 1 tablet by mouth.  . carvedilol (COREG) 6.25 MG tablet Take 1 tablet (6.25 mg total) by mouth 2 (two) times daily with a meal.  . conjugated estrogens (PREMARIN) vaginal cream Place 1 Applicatorful vaginally at bedtime.  Marland Kitchen escitalopram (LEXAPRO) 10 MG tablet Take 10 mg by mouth daily.  . feeding supplement, ENSURE ENLIVE, (ENSURE ENLIVE) LIQD Take 237 mLs by mouth 2 (two) times daily between meals.  . hydrALAZINE (APRESOLINE) 25 MG tablet Take 0.5 tablets (12.5 mg total) by mouth every 8 (eight)  hours.  . isosorbide mononitrate (IMDUR) 30 MG 24 hr tablet Take 0.5 tablets (15 mg total) by mouth daily.  Marland Kitchen oxyCODONE (OXY IR/ROXICODONE) 5 MG immediate release tablet Take 5 mg by mouth every 4 (four) hours as needed for pain.  . potassium chloride SA (KLOR-CON) 20 MEQ tablet Take 20 mEq by mouth 2 (two) times daily.  . Probiotic Product (PROBIOTIC DAILY PO) Take 1 capsule by mouth daily.   No current facility-administered medications on file prior to visit.      Social Hx: She  reports that she has never smoked. She has never used smokeless tobacco. She reports previous alcohol use. She reports that she does not use drugs.    Diagnosis/Reason for Visit:   CHF  Services Requested:  Physical Exam  Labs:  BMET in 1 week  # of Visits Needed/Frequency per Week: 1

## 2019-09-03 NOTE — Telephone Encounter (Signed)
Pt's medication was sent to pt's pharmacy as requested. Confirmation received.  °

## 2019-09-08 NOTE — Progress Notes (Signed)
Virtual Visit via Telephone Note   This visit type was conducted due to national recommendations for restrictions regarding the COVID-19 Pandemic (e.g. social distancing) in an effort to limit this patient's exposure and mitigate transmission in our community.  Due to her co-morbid illnesses, this patient is at least at moderate risk for complications without adequate follow up.  This format is felt to be most appropriate for this patient at this time.  The patient did not have access to video technology/had technical difficulties with video requiring transitioning to audio format only (telephone).  All issues noted in this document were discussed and addressed.  No physical exam could be performed with this format.  Please refer to the patient's chart for her  consent to telehealth for [facility].   Date:  09/16/2019   ID:  Jeanne Bruce, DOB 28-Nov-1955, MRN [id number]  Patient Location: Home Provider Location: Home  PCP:  Derrill Center., MD  Cardiologist:  Fransico Him, MD   Electrophysiologist:  None   Evaluation Performed:  Follow-Up Visit  Chief Complaint: follow up  History of Present Illness:    Jeanne Bruce is a 63 y.o. female  who was admitted to the hospital 07/2019 with urosepsis and septic encephalopathy.  She had brief respiratory arrest and V. fib arrest in the ED after receiving low-dose lorazepam.  She was intubated and resuscitated with one round of CPR.  No defibrillation needed.  2D echo showed EF of 25% with apical ballooning.  Cardiac catheterization showed normal coronary arteries and she was felt to have Takotsubo syndrome.  She was treated with hydralazine, Imdur and carvedilol.  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  Patient also has a history of hypertension, multiple sclerosis, history of colon cancer status post colectomy/colostomy.    I had a telemedicine visit with patient 09/03/19 and had run out  of carvedilol. BP was low and she had lost 168 lbs down to 116 lbs. I restarted coreg 3.125 mg bid and decreased hydralazine 1/2 tid. Had [facility] visit and draw labs.  Patient says weight was 110 yest and 112 lbs today. Now on an appetite enhancer and feels like she's eating better this week. No chest pain, dizziness, or presyncope. BP very low today at 76/67, repeat 102/62. BP was 122/70 at PCP. They don't think her BP cuff is accurate.  The patient does not have symptoms concerning for COVID-19 infection (fever, chills, cough, or new shortness of breath).    Past Medical History:  Diagnosis Date  . Cancer ([place])    colon (resolved)  . Hypertension   . Multiple sclerosis ([place])   . Renal disorder    Past Surgical History:  Procedure Laterality Date  . LEFT HEART CATH AND CORONARY ANGIOGRAPHY N/A 08/14/2019   Procedure: LEFT HEART CATH AND CORONARY ANGIOGRAPHY;  Surgeon: Burnell Blanks, MD;  Location: [place] [facility];  Service: Cardiovascular;  Laterality: N/A;  . NEPHRECTOMY TRANSPLANTED ORGAN       Current Meds  Medication Sig  . carvedilol (COREG) 6.25 MG tablet Take 1 tablet (6.25 mg total) by mouth 2 (two) times daily with a meal.  . cholecalciferol (VITAMIN D3) 25 MCG (1000 UT) tablet Take 2,000 Units by mouth daily.  Marland Kitchen conjugated estrogens (PREMARIN) vaginal cream Place 1 Applicatorful vaginally at bedtime.  Marland Kitchen escitalopram (LEXAPRO) 10 MG tablet Take 10 mg by mouth daily.  . hydrALAZINE (APRESOLINE) 25 MG tablet Take 0.5 tablets (  12.5 mg total) by mouth every 8 (eight) hours.  . isosorbide mononitrate (IMDUR) 30 MG 24 hr tablet Take 0.5 tablets (15 mg total) by mouth daily.  . megestrol (MEGACE) 40 MG tablet Take 40 mg by mouth daily.  Marland Kitchen oxyCODONE (OXY IR/ROXICODONE) 5 MG immediate release tablet Take 5 mg by mouth every 4 (four) hours as needed for pain.  . Probiotic Product (PROBIOTIC DAILY PO) Take 1 capsule by mouth daily.  Marland Kitchen UNABLE TO FIND Core Power nutritional  supplement.     Allergies:   Ativan [lorazepam], Invanz [ertapenem], Mirabegron, Claritin-d 12 hour [loratadine-pseudoephedrine er], and Penicillins   Social History   Tobacco Use  . Smoking status: Never Smoker  . Smokeless tobacco: Never Used  Substance Use Topics  . Alcohol use: Not Currently  . Drug use: Never     Family Hx: The patient's family history is negative for Diabetes Mellitus II.  ROS:   Please see the history of present illness.      All other systems reviewed and are negative.   Prior CV studies:   The following studies were reviewed today:  2D echo 08/11/2019  IMPRESSIONS      1. Left ventricular ejection fraction, by visual estimation, is 20 to 25%. The left ventricle has severely decreased function. Normal left ventricular size. There is no left ventricular hypertrophy.  2. Definity contrast agent was given IV to delineate the left ventricular endocardial borders.  3. Left ventricular diastolic Doppler parameters are consistent with impaired relaxation pattern of LV diastolic filling.  4. LVEF is severely depressed with diffuse hypokinesis; septal and apical akinesis.  5. Global right ventricle has normal systolic function.The right ventricular size is normal. No increase in right ventricular wall thickness.  6. Left atrial size was normal.  7. Right atrial size was normal.  8. The mitral valve is grossly normal. Trace mitral valve regurgitation.  9. The tricuspid valve is normal in structure. Tricuspid valve regurgitation is trivial. 10. The aortic valve is normal in structure. Aortic valve regurgitation is trivial by color flow Doppler. 11. The pulmonic valve was not well visualized. Pulmonic valve regurgitation is not visualized by color flow Doppler.   FINDINGS  Left Ventricle: Left ventricular ejection fraction, by visual estimation, is 20 to 25%. The left ventricle has severely decreased function. Definity contrast agent was given IV to delineate  the left ventricular endocardial borders. There is no left  ventricular hypertrophy. Normal left ventricular size. Spectral Doppler shows Left ventricular diastolic Doppler parameters are consistent with impaired relaxation pattern of LV diastolic filling. LVEF is severely depressed with diffuse hypokinesis;  septal and apical akinesis.   Right Ventricle: The right ventricular size is normal. No increase in right ventricular wall thickness. Global RV systolic function is has normal systolic function.   Left Atrium: Left atrial size was normal in size.   Right Atrium: Right atrial size was normal in size   Pericardium: There is no evidence of pericardial effusion.   Mitral Valve: The mitral valve is grossly normal. There is mild thickening of the mitral valve leaflet(s). Trace mitral valve regurgitation.   Tricuspid Valve: The tricuspid valve is normal in structure. Tricuspid valve regurgitation is trivial by color flow Doppler.   Aortic Valve: The aortic valve is normal in structure. Aortic valve regurgitation is trivial by color flow Doppler.   Pulmonic Valve: The pulmonic valve was not well visualized. Pulmonic valve regurgitation is not visualized by color flow Doppler.   Aorta: The aortic  root is normal in size and structure.   IAS/Shunts: No atrial level shunt detected by color flow Doppler.    Cardiac catheterization 08/14/2019 1. No angiographic evidence of CAD 2. Non-ischemic cardiomyopathy        Labs/Other Tests and Data Reviewed:    EKG:  No ECG reviewed.  Recent Labs: 08/12/2019: Magnesium 2.3 08/13/2019: ALT 27 08/16/2019: Hemoglobin 9.0; Platelets 237 09/08/2019: BUN 40; Creatinine, Ser 2.14; Potassium 4.8; Sodium 134   Recent Lipid Panel Lab Results  Component Value Date/Time   CHOL 160 08/11/2019 08:30 AM   TRIG 165 (H) 08/11/2019 08:30 AM   HDL 35 (L) 08/11/2019 08:30 AM   CHOLHDL 4.6 08/11/2019 08:30 AM   LDLCALC 92 08/11/2019 08:30 AM    Wt  Readings from Last 3 Encounters:  09/16/19 112 lb (50.8 kg)  09/12/19 112 lb (50.8 kg)  09/03/19 116 lb (52.6 kg)     Objective:    Vital Signs:  BP 102/62   Pulse 74   Ht 5\' 1"  (1.549 m)   Wt 112 lb (50.8 kg)   BMI 21.16 kg/m    VITAL SIGNS:  reviewed  ASSESSMENT & PLAN:    1. Takotsubo cardiomyopathy ejection fraction 25% on echo 07/2019, normal coronary arteries on cardiac catheterization 08/14/2019.  Patient ran out of her carvedilol and I restarted it 09/03/19 and reduced  hydralazine to half a tablet 3 times daily.    BP still running quite low but her daughter does not think the cuff is accurate.  We will try to get a new blood pressure cuff to her.  If still running low will stop isosorbide. 2. Chronic systolic CHF-has chronic ankle edema that is unchanged otherwise seems compensated. 3. Essential hypertension  trouble with low blood pressure. 4. Urosepsis with septic encephalopathy with brief respiratory arrest of V. fib arrest 07/2019 5. 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 6. Weight loss and anorexia started on Megace by PCP to increase her appetite.   COVID-19 Education: The signs and symptoms of COVID-19 were discussed with the patient and how to seek care for testing (follow up with PCP or arrange E-visit).   The importance of social distancing was discussed today.  Time:   Today, I have spent 11:31 minutes with the patient with telehealth technology discussing the above problems.     Medication Adjustments/Labs and Tests Ordered: Current medicines are reviewed at length with the patient today.  Concerns regarding medicines are outlined above.   Tests Ordered: No orders of the defined types were placed in this encounter.   Medication Changes: No orders of the defined types were placed in this encounter.   Follow Up:  Either In Person or Virtual in 2 week(s) Ermalinda Barrios PA-C or Dr.  Radford Pax  Signed, Ermalinda Barrios, PA-C  09/16/2019 1:17 PM    [place]

## 2019-09-09 LAB — BASIC METABOLIC PANEL
BUN/Creatinine Ratio: 19 (ref 12–28)
BUN: 40 mg/dL — ABNORMAL HIGH (ref 8–27)
CO2: 13 mmol/L — ABNORMAL LOW (ref 20–29)
Calcium: 9.7 mg/dL (ref 8.7–10.3)
Chloride: 104 mmol/L (ref 96–106)
Creatinine, Ser: 2.14 mg/dL — ABNORMAL HIGH (ref 0.57–1.00)
GFR calc Af Amer: 27 mL/min/{1.73_m2} — ABNORMAL LOW (ref >59)
GFR calc non Af Amer: 24 mL/min/{1.73_m2} — ABNORMAL LOW (ref >59)
Glucose: 125 mg/dL — ABNORMAL HIGH (ref 65–99)
Potassium: 4.8 mmol/L (ref 3.5–5.2)
Sodium: 134 mmol/L (ref 134–144)

## 2019-09-09 LAB — SPECIMEN STATUS REPORT

## 2019-09-10 ENCOUNTER — Other Ambulatory Visit: Payer: Self-pay

## 2019-09-10 DIAGNOSIS — N184 Chronic kidney disease, stage 4 (severe): Secondary | ICD-10-CM

## 2019-09-12 NOTE — Progress Notes (Signed)
Margarit Joubert      DOB: 04/27/1956  Purpose of Visit: BMET, Exam Ordering provider: [name], PA  Home visit on behalf of [facility]. Home Visit Progress Note was faxed to [facility].  Ms. Wandel has family or hired caregivers w/her almost 24/7.  During visit, she had a CNA/caregiver present who was knowledgeable about Ms. Remo's hx.  Ms. Holladay only c/o decreased appetite and that her weight has dropped from 168lbs in March to 112lbs this am.  She requested an appetite stimulator.  Also has a "pin-point" sized pressure ulcer from hospital stay that was a stage 3 according to her CNA, that has now almost healed completely.  Ms. Gossard also c/o increased urinary incontinence and frequent UTI's but is unable to determine when she has one.  States they don't hurt/burn but she does also have a clear, jelly-like vag d/c that doesn't have a foul smell.  BMET was drawn and brought to the [facility].   Vanita Ingles, RN 09/12/19

## 2019-09-16 ENCOUNTER — Encounter: Payer: Self-pay | Admitting: Physician Assistant

## 2019-09-16 ENCOUNTER — Telehealth: Payer: Self-pay | Admitting: *Deleted

## 2019-09-16 ENCOUNTER — Telehealth: Payer: Self-pay | Admitting: Cardiology

## 2019-09-16 ENCOUNTER — Telehealth (INDEPENDENT_AMBULATORY_CARE_PROVIDER_SITE_OTHER): Payer: BC Managed Care – PPO | Admitting: Physician Assistant

## 2019-09-16 ENCOUNTER — Other Ambulatory Visit: Payer: Self-pay

## 2019-09-16 VITALS — BP 102/62 | HR 74 | Ht 61.0 in | Wt 112.0 lb

## 2019-09-16 DIAGNOSIS — N184 Chronic kidney disease, stage 4 (severe): Secondary | ICD-10-CM

## 2019-09-16 DIAGNOSIS — I5022 Chronic systolic (congestive) heart failure: Secondary | ICD-10-CM | POA: Diagnosis not present

## 2019-09-16 DIAGNOSIS — I10 Essential (primary) hypertension: Secondary | ICD-10-CM

## 2019-09-16 DIAGNOSIS — I4901 Ventricular fibrillation: Secondary | ICD-10-CM

## 2019-09-16 DIAGNOSIS — R634 Abnormal weight loss: Secondary | ICD-10-CM

## 2019-09-16 DIAGNOSIS — I5181 Takotsubo syndrome: Secondary | ICD-10-CM | POA: Diagnosis not present

## 2019-09-16 NOTE — Telephone Encounter (Signed)
Called and made [facility] aware that patient needs to stop imdur. Instructed for her to check her BP later and if SBP < 100 she should hold the hydralazine. She verbalized understanding and thanked me for the call.

## 2019-09-16 NOTE — Telephone Encounter (Signed)
[name], Patients caregiver, calling to state that the patient just left her physical therapy appt and her BP was 80/48. [name] states that the patient feels weak which has been normal lately. Patient had virtual appt today with [name]. According to patient, she is to stop taking isosorbide mononitrate (IMDUR) 30 MG 24 hr tablet. Please confirm if this is true or not with [facility].

## 2019-09-16 NOTE — Telephone Encounter (Signed)
Yes lets stop the isosorbide.  When I mention this earlier they did not want to stop it until they got good blood pressure cuff because they did not think there is was working properly.  Please stop isosorbide thanks

## 2019-09-16 NOTE — Telephone Encounter (Signed)

## 2019-09-16 NOTE — Patient Instructions (Addendum)
Medication Instructions:  Your physician recommends that you continue on your current medications as directed. Please refer to the Current Medication list given to you today.  If you need a refill on your cardiac medications before your next appointment, please call your pharmacy.   Lab work: None Ordered  If you have labs (blood work) drawn today and your tests are completely normal, you will receive your results only by: Marland Kitchen MyChart Message (if you have MyChart) OR . A paper copy in the mail If you have any lab test that is abnormal or we need to change your treatment, we will call you to review the results.  Testing/Procedures: None ordered  Follow-Up: . Follow up with Dr. Radford Pax via VIRTUAL Visit on 10/01/19 at 2:40 PM  Any Other Special Instructions Will Be Listed Below (If Applicable).

## 2019-09-17 ENCOUNTER — Telehealth: Payer: Self-pay | Admitting: Licensed Clinical Social Worker

## 2019-09-17 NOTE — Telephone Encounter (Signed)
CSW referred to assist patient with obtaining a BP cuff. CSW contacted patient to inform cuff will be delivered to home. Patient grateful for support and assistance. CSW available as needed. Jackie Marceline Napierala, LCSW, CCSW-MCS [phone]  

## 2019-09-18 ENCOUNTER — Telehealth: Payer: Self-pay | Admitting: *Deleted

## 2019-09-18 ENCOUNTER — Other Ambulatory Visit: Payer: Self-pay

## 2019-09-18 ENCOUNTER — Other Ambulatory Visit: Payer: BC Managed Care – PPO | Admitting: *Deleted

## 2019-09-18 DIAGNOSIS — N184 Chronic kidney disease, stage 4 (severe): Secondary | ICD-10-CM

## 2019-09-18 NOTE — Telephone Encounter (Signed)
I spoke with [name] and told her with BP being so low today at PT she should have pt hold Coreg and hydralazine tonight. I told her we would call her back tomorrow with further recommendations once reviewed by provider

## 2019-09-18 NOTE — Telephone Encounter (Signed)
Jeanne Bruce, [place] Triage        Can you please call at [phone] regarding my medication.    Above message copied from my chart message.  I returned call and spoke with [name] (pt's caregiver). She states pt's blood pressure has been running low for quite awhile and she doesn't understand why pt is on medications that lower her BP. Pt had telemedicine visit with Ermalinda Barrios, PA earlier this week. Imdur was stopped after this visit due to BP.  [name] reports they have not checked BP since this visit as they do not have a cuff.  BP cuff is to arrive tomorrow. Pt is going to PT today and BP will be checked at this appointment.  There have been no changes in pt's status since 12/1 visit but [name] is concerned about pt's weight loss and lack of energy.  [name] will contact our office with BP reading at PT today.

## 2019-09-18 NOTE — Telephone Encounter (Signed)
Spoke to a triage nurse this am regarding Jeanne Bruce's meds. Her blood pressure at PT was 78/62. Concerned about her being on 2 meds that treat high BP. Jeanne Bruce is very fatigue, has nearly no appetite and continues to lose weight.   Should she continue the two medications that treat high blood pressure considering her BP is consistently low.    Please call at [phone].   Thank you,  Jeanne Bruce  Jeanne Bruce's caregiver  (I am with Jeanne Bruce currently.)   Above message copied from my chart message.   I placed call to [place].  Left message that we would call her back later with recommendations.  Will forward to Dr Radford Pax for review/recommendations.

## 2019-09-18 NOTE — Telephone Encounter (Signed)
[name] I would send this to [name] since she just saw her 2 days ago

## 2019-09-19 LAB — BASIC METABOLIC PANEL
BUN/Creatinine Ratio: 20 (ref 12–28)
BUN: 49 mg/dL — ABNORMAL HIGH (ref 8–27)
CO2: 13 mmol/L — ABNORMAL LOW (ref 20–29)
Calcium: 10.5 mg/dL — ABNORMAL HIGH (ref 8.7–10.3)
Chloride: 105 mmol/L (ref 96–106)
Creatinine, Ser: 2.47 mg/dL — ABNORMAL HIGH (ref 0.57–1.00)
GFR calc Af Amer: 23 mL/min/{1.73_m2} — ABNORMAL LOW (ref >59)
GFR calc non Af Amer: 20 mL/min/{1.73_m2} — ABNORMAL LOW (ref >59)
Glucose: 98 mg/dL (ref 65–99)
Potassium: 5 mmol/L (ref 3.5–5.2)
Sodium: 136 mmol/L (ref 134–144)

## 2019-09-19 MED ORDER — CARVEDILOL 3.125 MG PO TABS: 3.1250 mg | ORAL_TABLET | Freq: Two times a day (BID) | ORAL | 3 refills | Status: DC

## 2019-09-19 MED ORDER — HYDRALAZINE HCL 25 MG PO TABS: ORAL_TABLET | ORAL | 3 refills | Status: DC

## 2019-09-19 NOTE — Telephone Encounter (Signed)
Spoke with [name], pts caregiver, at the consent of the pt. She is there with her...   She verbalized understanding of Jeanne Bruce's recommendations. Will continue to mionitor her BP and call us if she has any further problems or no improvement.

## 2019-09-19 NOTE — Telephone Encounter (Signed)
Please find out if patient got a new BP cuff for the most recent readings.  Apparently when she saw [name] she though the BP cuff was not accurate

## 2019-09-19 NOTE — Telephone Encounter (Signed)
Most recent readings were taken by Physical Therapist.  Pt did not have a BP cuff at home but it is to arrive today.

## 2019-09-19 NOTE — Telephone Encounter (Signed)
Decrease coreg 3.125 mg bid, decrease hydralazine to 12.5 mg bid. Hold both if systolic BP less than 927. Encourage her to stay hydrated. Let us know if it continues to run low.Thanks.

## 2019-10-01 ENCOUNTER — Other Ambulatory Visit: Payer: Self-pay

## 2019-10-01 ENCOUNTER — Encounter: Payer: Self-pay | Admitting: Cardiology

## 2019-10-01 ENCOUNTER — Telehealth (INDEPENDENT_AMBULATORY_CARE_PROVIDER_SITE_OTHER): Payer: BC Managed Care – PPO | Admitting: Cardiology

## 2019-10-01 VITALS — BP 124/58 | HR 79

## 2019-10-01 DIAGNOSIS — I5022 Chronic systolic (congestive) heart failure: Secondary | ICD-10-CM

## 2019-10-01 DIAGNOSIS — N184 Chronic kidney disease, stage 4 (severe): Secondary | ICD-10-CM | POA: Diagnosis not present

## 2019-10-01 DIAGNOSIS — I5181 Takotsubo syndrome: Secondary | ICD-10-CM | POA: Diagnosis not present

## 2019-10-01 DIAGNOSIS — I10 Essential (primary) hypertension: Secondary | ICD-10-CM

## 2019-10-01 NOTE — Progress Notes (Signed)
Virtual Visit via Telephone Note   This visit type was conducted due to national recommendations for restrictions regarding the COVID-19 Pandemic (e.g. social distancing) in an effort to limit this patient's exposure and mitigate transmission in our community.  Due to her co-morbid illnesses, this patient is at least at moderate risk for complications without adequate follow up.  This format is felt to be most appropriate for this patient at this time.  All issues noted in this document were discussed and addressed.  A limited physical exam was performed with this format.  Please refer to the patient's chart for her consent to telehealth for [facility].   Evaluation Performed:  Follow-up visit  This visit type was conducted due to national recommendations for restrictions regarding the COVID-19 Pandemic (e.g. social distancing).  This format is felt to be most appropriate for this patient at this time.  All issues noted in this document were discussed and addressed.  No physical exam was performed (except for noted visual exam findings with Video Visits).  Please refer to the patient's chart (MyChart message for video visits and phone note for telephone visits) for the patient's consent to telehealth for [facility].  Date:  10/01/2019   ID:  Jeanne Bruce, DOB 1956-02-05, MRN [id number]  Patient Location:  Home  Provider location:   [place]  PCP:  Derrill Center., MD  Cardiologist:  Fransico Him, MD  Electrophysiologist:  None   Chief Complaint:  DCM, CHF, HTN  History of Present Illness:    Jeanne Bruce is a 63 y.o. female who presents via audio/video conferencing for a telehealth visit today.    Jeanne Bruce is a 63 y.o. female  who was admitted to the hospital 07/2019 with urosepsis and septic encephalopathy. She had brief respiratory arrest and V. fib arrest in the ED after receiving low-dose lorazepam. She was intubated and resuscitated with one round of CPR. No defibrillation  needed. 2D echo showed EF of 25% with apical ballooning. Cardiac catheterization showed normal coronary arteries and she was felt to have Takotsubo syndrome. She was treated with hydralazine, Imdur and carvedilol. 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 Patient also has a history of hypertension, multiple sclerosis, history of colon cancer status post colectomy/colostomy.   On Telemedicine visit with patient 09/03/19 she had run out of carvedilol. BP was low and she had lost 168 lbs down to 116 lbs. Her coreg was restarted and decreased hydralazine 1/2 tid.   She is here today for followup and is doing well.  She denies any chest pain or pressure, SOB, DOE, PND, orthopnea, LE edema, dizziness, palpitations or syncope. She is compliant with her meds and is tolerating meds with no SE.    The patient does not have symptoms concerning for COVID-19 infection (fever, chills, cough, or new shortness of breath).    Prior CV studies:   The following studies were reviewed today:  none  Past Medical History:  Diagnosis Date  . Cancer ([place])    colon (resolved)  . Hypertension   . Multiple sclerosis ([place])   . Renal disorder    Past Surgical History:  Procedure Laterality Date  . LEFT HEART CATH AND CORONARY ANGIOGRAPHY N/A 08/14/2019   Procedure: LEFT HEART CATH AND CORONARY ANGIOGRAPHY;  Surgeon: Burnell Blanks, MD;  Location: [place] [facility];  Service: Cardiovascular;  Laterality: N/A;  . NEPHRECTOMY TRANSPLANTED ORGAN       Current  Meds  Medication Sig  . carvedilol (COREG) 3.125 MG tablet Take 1 tablet (3.125 mg total) by mouth 2 (two) times daily. Hold for systolic BP less than 791.  . cholecalciferol (VITAMIN D3) 25 MCG (1000 UT) tablet Take 2,000 Units by mouth daily.  Marland Kitchen conjugated estrogens (PREMARIN) vaginal cream Place 1 Applicatorful vaginally at bedtime.  Marland Kitchen escitalopram (LEXAPRO) 10 MG tablet Take 10  mg by mouth daily.  . Probiotic Product (PROBIOTIC DAILY PO) Take 1 capsule by mouth daily.  Marland Kitchen UNABLE TO FIND Core Power nutritional supplement.     Allergies:   Ativan [lorazepam], Invanz [ertapenem], Mirabegron, Claritin-d 12 hour [loratadine-pseudoephedrine er], and Penicillins   Social History   Tobacco Use  . Smoking status: Never Smoker  . Smokeless tobacco: Never Used  Substance Use Topics  . Alcohol use: Not Currently  . Drug use: Never     Family Hx: The patient's family history is negative for Diabetes Mellitus II.  ROS:   Please see the history of present illness.     All other systems reviewed and are negative.   Labs/Other Tests and Data Reviewed:    Recent Labs: 08/12/2019: Magnesium 2.3 08/13/2019: ALT 27 08/16/2019: Hemoglobin 9.0; Platelets 237 09/18/2019: BUN 49; Creatinine, Ser 2.47; Potassium 5.0; Sodium 136   Recent Lipid Panel Lab Results  Component Value Date/Time   CHOL 160 08/11/2019 08:30 AM   TRIG 165 (H) 08/11/2019 08:30 AM   HDL 35 (L) 08/11/2019 08:30 AM   CHOLHDL 4.6 08/11/2019 08:30 AM   LDLCALC 92 08/11/2019 08:30 AM    Wt Readings from Last 3 Encounters:  09/16/19 112 lb (50.8 kg)  09/12/19 112 lb (50.8 kg)  09/03/19 116 lb (52.6 kg)     Objective:    Vital Signs:  BP (!) 124/58   Pulse 79    CONSTITUTIONAL:  Well nourished, well developed female in no acute distress.  EYES: anicteric MOUTH: oral mucosa is pink RESPIRATORY: Normal respiratory effort, symmetric expansion CARDIOVASCULAR: No peripheral edema SKIN: No rash, lesions or ulcers MUSCULOSKELETAL: no digital cyanosis NEURO: Cranial Nerves II-XII grossly intact, moves all extremities PSYCH: Intact judgement and insight.  A&O x 3, Mood/affect appropriate   ASSESSMENT & PLAN:    1.  Takotsubo DCM -EF 25% on echo 10.2020  -normal coronary arteries on cath -she is 2 months out from hospitalization so will repeat 2D echo to reassess LVF  2.  Chronic systolic  CHF -she has chronic ankle edema that is very stable. -continue Carvedilol 3.125mg  BID -has not tolerated any higher dose of HF meds due to soft BPs  3.  HTN -BP stable -continue Carvedilol 3.125mg  BID   4.  CKD stage 4 -s/p nephrectomy for renal abscess 07/2019 -set up OV with nephrology as she has not gotten an appt yet - please set up at [place] Education: The signs and symptoms of COVID-19 were discussed with the patient and how to seek care for testing (follow up with PCP or arrange E-visit).  The importance of social distancing was discussed today.  Patient Risk:   After full review of this patient's clinical status, I feel that they are at least moderate risk at this time.  Time:   Today, I have spent 20 minutes directly with the patient on telemedicine discussing medical problems including Stress CM, CHF, HTN.  We also reviewed the symptoms of COVID 19 and the ways to protect against contracting the virus with telehealth technology.  I spent an  additional 5 minutes reviewing patient's chart including 2D echo and labs.  Medication Adjustments/Labs and Tests Ordered: Current medicines are reviewed at length with the patient today.  Concerns regarding medicines are outlined above.  Tests Ordered: No orders of the defined types were placed in this encounter.  Medication Changes: No orders of the defined types were placed in this encounter.   Disposition:  Follow up in 6 month(s)  Signed, Fransico Him, MD  10/01/2019 2:46 PM    [place]

## 2019-10-01 NOTE — Patient Instructions (Signed)
Medication Instructions:  Your physician recommends that you continue on your current medications as directed. Please refer to the Current Medication list given to you today. *If you need a refill on your cardiac medications before your next appointment, please call your pharmacy*  Testing/Procedures: Your physician has requested that you have an echocardiogram. Echocardiography is a painless test that uses sound waves to create images of your heart. It provides your doctor with information about the size and shape of your heart and how well your heart's chambers and valves are working. This procedure takes approximately one hour. There are no restrictions for this procedure.  Follow-Up: At [facility], you and your health needs are our priority.  As part of our continuing mission to provide you with exceptional heart care, we have created designated Provider Care Teams.  These Care Teams include your primary Cardiologist (physician) and Advanced Practice Providers (APPs -  Physician Assistants and Nurse Practitioners) who all work together to provide you with the care you need, when you need it.  Your next appointment:   6 month(s)  The format for your next appointment:   Either In Person or Virtual  Provider:   Fransico Him, MD  Other Instructions You have been referred to [facility].

## 2019-10-14 ENCOUNTER — Ambulatory Visit (HOSPITAL_COMMUNITY): Payer: BC Managed Care – PPO | Attending: Cardiovascular Disease

## 2019-10-14 ENCOUNTER — Other Ambulatory Visit: Payer: Self-pay

## 2019-10-14 ENCOUNTER — Other Ambulatory Visit: Payer: Self-pay | Admitting: Cardiology

## 2019-10-14 DIAGNOSIS — I5181 Takotsubo syndrome: Secondary | ICD-10-CM

## 2019-10-20 ENCOUNTER — Telehealth: Payer: Self-pay

## 2019-10-20 DIAGNOSIS — I5022 Chronic systolic (congestive) heart failure: Secondary | ICD-10-CM

## 2019-10-20 DIAGNOSIS — I420 Dilated cardiomyopathy: Secondary | ICD-10-CM

## 2019-10-20 DIAGNOSIS — I5181 Takotsubo syndrome: Secondary | ICD-10-CM

## 2019-10-20 NOTE — Telephone Encounter (Signed)
-----   Message from Frederik Schmidt, RN sent at 10/20/2019  8:20 AM EST -----  ----- Message ----- From: Sueanne Margarita, MD Sent: 10/16/2019   6:58 PM EST To: [facility] Triage  Echo showed severely reduced heart function with trivial leakiness of Mitral valve.  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  Soft BP has limited treatment. Please refer to advanced HF clinic ASAP

## 2019-10-20 NOTE — Telephone Encounter (Signed)
The patient has been notified of the result and verbalized understanding.  All questions (if any) were answered. Antonieta Iba, RN 10/20/2019 4:35 PM

## 2019-11-07 ENCOUNTER — Encounter (HOSPITAL_COMMUNITY): Payer: Self-pay | Admitting: *Deleted

## 2019-11-07 ENCOUNTER — Ambulatory Visit (HOSPITAL_COMMUNITY)
Admission: RE | Admit: 2019-11-07 | Discharge: 2019-11-07 | Disposition: A | Discharge status: Home / Self Care | Payer: BC Managed Care – PPO | Source: Ambulatory Visit | Attending: Cardiology | Admitting: Cardiology

## 2019-11-07 ENCOUNTER — Other Ambulatory Visit: Payer: Self-pay

## 2019-11-07 VITALS — BP 122/50 | HR 72 | Wt 116.2 lb

## 2019-11-07 DIAGNOSIS — N184 Chronic kidney disease, stage 4 (severe): Secondary | ICD-10-CM | POA: Diagnosis not present

## 2019-11-07 DIAGNOSIS — Z905 Acquired absence of kidney: Secondary | ICD-10-CM | POA: Diagnosis not present

## 2019-11-07 DIAGNOSIS — G35 Multiple sclerosis: Secondary | ICD-10-CM | POA: Diagnosis not present

## 2019-11-07 DIAGNOSIS — Z85038 Personal history of other malignant neoplasm of large intestine: Secondary | ICD-10-CM | POA: Diagnosis not present

## 2019-11-07 DIAGNOSIS — I130 Hypertensive heart and chronic kidney disease with heart failure and stage 1 through stage 4 chronic kidney disease, or unspecified chronic kidney disease: Secondary | ICD-10-CM | POA: Diagnosis not present

## 2019-11-07 DIAGNOSIS — I428 Other cardiomyopathies: Secondary | ICD-10-CM | POA: Diagnosis not present

## 2019-11-07 DIAGNOSIS — R9431 Abnormal electrocardiogram [ECG] [EKG]: Secondary | ICD-10-CM | POA: Insufficient documentation

## 2019-11-07 DIAGNOSIS — Z79899 Other long term (current) drug therapy: Secondary | ICD-10-CM | POA: Diagnosis not present

## 2019-11-07 DIAGNOSIS — I5022 Chronic systolic (congestive) heart failure: Secondary | ICD-10-CM | POA: Insufficient documentation

## 2019-11-07 DIAGNOSIS — Z933 Colostomy status: Secondary | ICD-10-CM | POA: Insufficient documentation

## 2019-11-07 DIAGNOSIS — Z9049 Acquired absence of other specified parts of digestive tract: Secondary | ICD-10-CM | POA: Diagnosis not present

## 2019-11-07 DIAGNOSIS — I447 Left bundle-branch block, unspecified: Secondary | ICD-10-CM | POA: Diagnosis not present

## 2019-11-07 LAB — BASIC METABOLIC PANEL
Anion gap: 9 (ref 5–15)
BUN: 38 mg/dL — ABNORMAL HIGH (ref 8–23)
CO2: 20 mmol/L — ABNORMAL LOW (ref 22–32)
Calcium: 8.3 mg/dL — ABNORMAL LOW (ref 8.9–10.3)
Chloride: 114 mmol/L — ABNORMAL HIGH (ref 98–111)
Creatinine, Ser: 2.04 mg/dL — ABNORMAL HIGH (ref 0.44–1.00)
GFR calc Af Amer: 29 mL/min — ABNORMAL LOW (ref >60)
GFR calc non Af Amer: 25 mL/min — ABNORMAL LOW (ref >60)
Glucose, Bld: 82 mg/dL (ref 70–99)
Potassium: 3.5 mmol/L (ref 3.5–5.1)
Sodium: 143 mmol/L (ref 135–145)

## 2019-11-07 LAB — BRAIN NATRIURETIC PEPTIDE: B Natriuretic Peptide: 53.8 pg/mL (ref 0.0–100.0)

## 2019-11-07 NOTE — Patient Instructions (Signed)
RESTART Coreg 3.125mg  (1 tab) twice a day   Labs today We will only contact you if something comes back abnormal or we need to make some changes. Otherwise no news is good news!   You have been referred to cardiac rehab.  They will call you to schedule as soon as they open up to have patients in person.   Your physician recommends that you schedule a follow-up appointment in: 1 month with with Dr Aundra Dubin.    Please call office at [phone] option 2 if you have any questions or concerns.    At the [facility], you and your health needs are our priority. As part of our continuing mission to provide you with exceptional heart care, we have created designated Provider Care Teams. These Care Teams include your primary Cardiologist (physician) and Advanced Practice Providers (APPs- Physician Assistants and Nurse Practitioners) who all work together to provide you with the care you need, when you need it.   You may see any of the following providers on your designated Care Team at your next follow up: Marland Kitchen Dr Glori Bickers . Dr Loralie Champagne . Darrick Grinder, NP . Lyda Jester, PA . Audry Riles, PharmD   Please be sure to bring in all your medications bottles to every appointment.

## 2019-11-07 NOTE — Progress Notes (Signed)
Referral received and verified for MD signature. Pt referred to cardiac rehab by Dr. Aundra Dubin with the diagnosis of chronic systolic heart failure. covid risk score 4.  Pt seen today to establish care with advanced heart failure.  Pt referred by Dr. Radford Pax.  Review office notes and this pt is appropriate for virtual cardiac rehab.  Due to the senior leadership directed instructions to pause onsite cardiac rehab in response to the surge of Covid + patients within the health system staff were deployed to support health system and community needs.  Will wait to check pt insurance benefits and eligibility once onsite cardiac rehab is permitted to reopen. Will forward to support staff to contact pt for scheduling initial assessment.  Cherre Huger, BSN Cardiac and Training and development officer

## 2019-11-08 NOTE — Progress Notes (Signed)
PCP: Derrill Center., MD Cardiology: Dr. Radford Pax HF Cardiology: Dr. Aundra Dubin  64 y.o. with history of CKD stage 4, chronic systolic CHF, multiple sclerosis, and history of right nephrectomy was referred by Dr. Radford Pax for CHF evaluation.  Patient also had remote colon cancer with colostomy.  She has had frequent UTIs and was admitted in 10/20 with urosepsis.  She had respiratory arrest and VF arrest in the ER and was intubated.  Echo was done, showing EF 25% with apical ballooning.  LHC showed no significant coronary disease.  She ended up having right nephrectomy for renal abscess in 10/20.    Echo was repeated in 12/20, showing EF still low at 25-30% with peri-apical akinesis.   She has had a hard time taking cardiac medications due to orthostatic symptoms with low BP. She is currently not on any cardiac meds (has not taken Coreg for a few days).  She was sent home from the hospital in 10/20 on hydralazine/Imdur but had to stop due to low BP.  She still has occasional "dizzy spells," usually when her SBP drops down to around 90.  She has a dry chronic cough.  She fatigues easily but does not really get short of breath.  No orthopnea/PND.  No chest pain.   ECG (personally reviewed): NSR, LBBB 150 msec  Labs (11/20): creatinine 1.5 Labs (12/20): creatinine 2.47 Labs (1/21): creatinine 2.1  PMH: 1. H/o right nephrectomy for renal abscess in 10/20.  2. Multiple Sclerosis 3. HTN 4. H/o colon cancer: s/p colectomy with colostomy.  5. CKD: Stage 4. Sees a nephrologist at [facility].  6. Frequent UTIs.  7. Chronic systolic CHF: Nonischemic cardiomyopathy.  - Echo (10/20): EF 25%, apical ballooning.  - LHC (10/20): Normal coronaries.  - Echo (12/20): EF 25-30%, peri-apical akinesis, normal RV size and systolic function (similar to 10/20 echo).    Social History   Socioeconomic History  . Marital status: Married    Spouse name: Not on file  . Number of children: Not on file  . Years of education: Not  on file  . Highest education level: Not on file  Occupational History  . Not on file  Tobacco Use  . Smoking status: Never Smoker  . Smokeless tobacco: Never Used  Substance and Sexual Activity  . Alcohol use: Not Currently  . Drug use: Never  . Sexual activity: Not on file  Other Topics Concern  . Not on file  Social History Narrative  . Not on file   Social Determinants of Health   Financial Resource Strain:   . Difficulty of Paying Living Expenses: Not on file  Food Insecurity:   . Worried About Charity fundraiser in the Last Year: Not on file  . Ran Out of Food in the Last Year: Not on file  Transportation Needs:   . Lack of Transportation (Medical): Not on file  . Lack of Transportation (Non-Medical): Not on file  Physical Activity:   . Days of Exercise per Week: Not on file  . Minutes of Exercise per Session: Not on file  Stress:   . Feeling of Stress : Not on file  Social Connections:   . Frequency of Communication with Friends and Family: Not on file  . Frequency of Social Gatherings with Friends and Family: Not on file  . Attends Religious Services: Not on file  . Active Member of Clubs or Organizations: Not on file  . Attends Archivist Meetings: Not on file  .  Marital Status: Not on file  Intimate Partner Violence:   . Fear of Current or Ex-Partner: Not on file  . Emotionally Abused: Not on file  . Physically Abused: Not on file  . Sexually Abused: Not on file   Family History  Problem Relation Age of Onset  . Diabetes Mellitus II Neg Hx    ROS: All systems reviewed and negative except as per HPI.   Current Outpatient Medications  Medication Sig Dispense Refill  . carvedilol (COREG) 3.125 MG tablet Take 1 tablet (3.125 mg total) by mouth 2 (two) times daily. Hold for systolic BP less than 127. 180 tablet 3  . cholecalciferol (VITAMIN D3) 25 MCG (1000 UT) tablet Take 2,000 Units by mouth daily.    Marland Kitchen conjugated estrogens (PREMARIN) vaginal  cream Place 1 Applicatorful vaginally at bedtime.    Marland Kitchen escitalopram (LEXAPRO) 10 MG tablet Take 10 mg by mouth daily.    . magnesium oxide (MAG-OX) 400 MG tablet Take 400 mg by mouth daily.    . ondansetron (ZOFRAN) 4 MG tablet Take 4 mg by mouth every 8 (eight) hours as needed for nausea or vomiting.    . Probiotic Product (PROBIOTIC DAILY PO) Take 1 capsule by mouth daily.    . solifenacin (VESICARE) 5 MG tablet Take by mouth.    Marland Kitchen UNABLE TO FIND Core Power nutritional supplement.     No current facility-administered medications for this encounter.   BP (!) 122/50   Pulse 72   Wt 52.7 kg (116 lb 3.2 oz)   SpO2 100%   BMI 21.96 kg/m  General: NAD Neck: No JVD, no thyromegaly or thyroid nodule.  Lungs: Clear to auscultation bilaterally with normal respiratory effort. CV: Nondisplaced PMI.  Heart regular S1/S2, no S3/S4, no murmur.  No peripheral edema.  No carotid bruit.  Normal pedal pulses.  Abdomen: Soft, nontender, no hepatosplenomegaly, no distention.  Skin: Intact without lesions or rashes.  Neurologic: Alert and oriented x 3.  Psych: Normal affect. Extremities: No clubbing or cyanosis.  HEENT: Normal.   Assessment/Plan:  1. Chronic systolic CHF: Nonischemic cardiomyopathy, LHC in 10/20 with no significant coronary disease.  Echo in 10/20 was suggestive of stress (Takotsubo-type) cardiomyopathy with apical ballooning (from severe urosepsis).  However, LV function did not improve on repeat echo in 12/20.  On exam, she is not volume overloaded.  NYHA class II symptoms.  She has not been able to tolerate BP-active medications.  - I would like her to restart Coreg 3.125 mg bid.   - No ACEI/ARNI/ARB/spironolactone for now with CKD stage 4 and low BP.  - I would like her to do cardiac rehab when it re-opens.   - BMET /BNP today.  - I will have her followup in 1 month, if she is still fatigued and feeling poorly, think it would be reasonable to do RHC.  - I would like to reassess EF  6 months after initial event in 10/20.  If EF has not recovered, will need to consider CRT-D device (she has chronic LBBB, QRS 150 msec). Would like to do cardiac MRI but creatinine may be an issue so will most likely get echo.  2. CKD stage 4: S/p right nephrectomy in 10/20.  Sees nephrology at [facility].   Loralie Champagne 11/08/2019

## 2019-11-12 ENCOUNTER — Telehealth (HOSPITAL_COMMUNITY): Payer: Self-pay | Admitting: *Deleted

## 2019-11-12 NOTE — Telephone Encounter (Signed)
Called and left message for pt in regards to cardiac rehab referral.  Requested call back, contact information provided. Cherre Huger, BSN Cardiac and Training and development officer

## 2019-11-18 ENCOUNTER — Encounter (HOSPITAL_COMMUNITY): Payer: BC Managed Care – PPO | Admitting: Cardiology

## 2019-11-18 ENCOUNTER — Encounter (HOSPITAL_COMMUNITY): Payer: Self-pay

## 2019-11-18 ENCOUNTER — Telehealth (HOSPITAL_COMMUNITY): Payer: Self-pay | Admitting: Family Medicine

## 2019-11-19 ENCOUNTER — Telehealth (HOSPITAL_COMMUNITY): Payer: Self-pay | Admitting: Family Medicine

## 2019-11-20 ENCOUNTER — Ambulatory Visit (HOSPITAL_COMMUNITY): Payer: BC Managed Care – PPO

## 2019-11-20 ENCOUNTER — Telehealth (HOSPITAL_COMMUNITY): Payer: Self-pay

## 2019-11-20 NOTE — Telephone Encounter (Signed)
Cardiac Rehab Medication Review by a Pharmacist  Does the patient  feel that his/her medications are working for him/her?  no  Has the patient been experiencing any side effects to the medications prescribed?  no  Does the patient measure his/her own blood pressure or blood glucose at home?  Yes. Takes it daily in the morning. Yesterday morning reports blood pressure was 154/72 which is a more consistent reading for the patient and also reports the day prior blood pressure was 126/59.   Does the patient have any problems obtaining medications due to transportation or finances?   no  Understanding of regimen: good Understanding of indications: good Potential of compliance: good    Pharmacist comments: N/A  Cristela Felt, PharmD PGY1 Pharmacy Resident [employer]: [phone]  11/20/2019 3:23 PM

## 2019-11-21 ENCOUNTER — Telehealth (HOSPITAL_COMMUNITY): Payer: Self-pay | Admitting: *Deleted

## 2019-11-21 ENCOUNTER — Other Ambulatory Visit: Payer: Self-pay

## 2019-11-21 ENCOUNTER — Encounter (HOSPITAL_COMMUNITY)
Admission: RE | Admit: 2019-11-21 | Discharge: 2019-11-21 | Disposition: A | Discharge status: Home / Self Care | Payer: BC Managed Care – PPO | Source: Ambulatory Visit | Attending: Cardiology | Admitting: Cardiology

## 2019-11-21 DIAGNOSIS — I5022 Chronic systolic (congestive) heart failure: Secondary | ICD-10-CM | POA: Insufficient documentation

## 2019-11-21 NOTE — Telephone Encounter (Signed)
Spoke to the patient confirmed orientation appointment for next tuesday. Completed health history.Barnet Pall, RN,BSN 11/21/2019 1:44 PM

## 2019-11-24 ENCOUNTER — Telehealth (HOSPITAL_COMMUNITY): Payer: Self-pay

## 2019-11-24 NOTE — Telephone Encounter (Signed)
Pt insurance is active and benefits verified through [employer] Co-pay 0, DED $600/$100.93 met, out of pocket $1,800/$400.93 met, co-insurance 20%. no pre-authorization required. [employer]port, 11/24/2019@8 :47am, REF# [id number]  Will contact patient to see if she is interested in the Cardiac Rehab Program. If interested, patient will need to complete follow up appt. Once completed, patient will be contacted for scheduling upon review by the RN Navigator.

## 2019-11-25 ENCOUNTER — Other Ambulatory Visit: Payer: Self-pay

## 2019-11-25 ENCOUNTER — Encounter (HOSPITAL_COMMUNITY): Payer: Self-pay

## 2019-11-25 ENCOUNTER — Encounter (HOSPITAL_COMMUNITY)
Admission: RE | Admit: 2019-11-25 | Discharge: 2019-11-25 | Disposition: A | Discharge status: Home / Self Care | Payer: BC Managed Care – PPO | Source: Ambulatory Visit | Attending: Cardiology | Admitting: Cardiology

## 2019-11-25 VITALS — BP 156/68 | HR 64 | Temp 97.7°F | Ht 61.0 in | Wt 127.2 lb

## 2019-11-25 DIAGNOSIS — I5022 Chronic systolic (congestive) heart failure: Secondary | ICD-10-CM

## 2019-11-25 NOTE — Progress Notes (Signed)
Cardiac Individual Treatment Plan  Patient Details  Name: Jeanne Bruce MRN: [id number] Date of Birth: [birth year] Referring Provider:     CARDIAC REHAB PHASE II ORIENTATION from 11/25/2019 in [place]  Referring Provider  Loralie Champagne, MD      Initial Encounter Date:    CARDIAC REHAB PHASE II ORIENTATION from 11/25/2019 in [place]  Date  11/25/19      Visit Diagnosis: Chronic systolic CHF (congestive heart failure) ([place])  Patient's Home Medications on Admission:  Current Outpatient Medications:  .  carvedilol (COREG) 3.125 MG tablet, Take 1 tablet (3.125 mg total) by mouth 2 (two) times daily. Hold for systolic BP less than 829., Disp: 180 tablet, Rfl: 3 .  cholecalciferol (VITAMIN D3) 25 MCG (1000 UT) tablet, Take 2,000 Units by mouth daily., Disp: , Rfl:  .  conjugated estrogens (PREMARIN) vaginal cream, Place 1 Applicatorful vaginally at bedtime., Disp: , Rfl:  .  escitalopram (LEXAPRO) 10 MG tablet, Take 10 mg by mouth daily., Disp: , Rfl:  .  magnesium oxide (MAG-OX) 400 MG tablet, Take 400 mg by mouth daily., Disp: , Rfl:  .  ondansetron (ZOFRAN) 4 MG tablet, Take 4 mg by mouth every 8 (eight) hours as needed for nausea or vomiting., Disp: , Rfl:  .  Probiotic Product (PROBIOTIC DAILY PO), Take 1 capsule by mouth daily., Disp: , Rfl:  .  solifenacin (VESICARE) 5 MG tablet, Take by mouth., Disp: , Rfl:  .  UNABLE TO FIND, Core Power nutritional supplement., Disp: , Rfl:   Past Medical History: Past Medical History:  Diagnosis Date  . Cancer ([place])    colon (resolved)  . Hypertension   . Multiple sclerosis ([place])   . Renal disorder     Tobacco Use: Social History   Tobacco Use  Smoking Status Never Smoker  Smokeless Tobacco Never Used    Labs: Recent Review Flowsheet Data    Labs for ITP Cardiac and Pulmonary Rehab Latest Ref Rng & Units 08/11/2019 08/11/2019 08/11/2019 08/11/2019 08/11/2019    Cholestrol 0 - 200 mg/dL - 160 - - -   LDLCALC 0 - 99 mg/dL - 92 - - -   HDL >40 mg/dL - 35(L) - - -   Trlycerides <150 mg/dL - 165(H) - - -   Hemoglobin A1c 4.8 - 5.6 % 4.5(L) - - - 4.6(L)   PHART 7.350 - 7.450 - - 7.616(HH) 7.488(H) -   PCO2ART 32.0 - 48.0 mmHg - - 26.0(L) 34.0 -   HCO3 20.0 - 28.0 mmol/L - - 26.5 25.7 -   TCO2 22 - 32 mmol/L - - 27 27 -   O2SAT % - - 100.0 100.0 -      Capillary Blood Glucose: Lab Results  Component Value Date   GLUCAP 88 08/14/2019   GLUCAP 104 (H) 08/14/2019   GLUCAP 105 (H) 08/14/2019   GLUCAP 124 (H) 08/13/2019   GLUCAP 135 (H) 08/13/2019     Exercise Target Goals: Exercise Program Goal: Individual exercise prescription set using results from initial 6 min walk test and THRR while considering  patient's activity barriers and safety.   Exercise Prescription Goal: Initial exercise prescription builds to 30-45 minutes a day of aerobic activity, 2-3 days per week.  Home exercise guidelines will be given to patient during program as part of exercise prescription that the participant will acknowledge.  Activity Barriers & Risk Stratification: Activity Barriers & Cardiac Risk Stratification - 11/25/19 1145  Activity Barriers & Cardiac Risk Stratification   Activity Barriers  Other (comment);Assistive Device    Comments  Multiple sclerosis- right side weaker than left.    Cardiac Risk Stratification  High       6 Minute Walk: 6 Minute Walk    Row Name 11/25/19 1157         6 Minute Walk   Phase  Initial     Distance  1243 feet     Walk Time  6 minutes     # of Rest Breaks  0     MPH  2.35     METS  3.08     RPE  11     Perceived Dyspnea   1     VO2 Peak  10.79     Symptoms  Yes (comment)     Comments  Mild SOB     Resting HR  64 bpm     Resting BP  156/68     Resting Oxygen Saturation   99 %     Exercise Oxygen Saturation  during 6 min walk  97 %     Max Ex. HR  94 bpm     Max Ex. BP  134/78     2 Minute Post BP   122/78        Oxygen Initial Assessment:   Oxygen Re-Evaluation:   Oxygen Discharge (Final Oxygen Re-Evaluation):   Initial Exercise Prescription: Initial Exercise Prescription - 11/25/19 1300      Date of Initial Exercise RX and Referring Provider   Date  11/25/19    Referring Provider  Loralie Champagne, MD    Expected Discharge Date  01/30/20      Recumbant Bike   Level  2    Watts  15    Minutes  15    METs  2.83      NuStep   Level  2    SPM  85    Minutes  15    METs  2      Prescription Details   Frequency (times per week)  3    Duration  Progress to 30 minutes of continuous aerobic without signs/symptoms of physical distress      Intensity   THRR 40-80% of Max Heartrate  62-124    Ratings of Perceived Exertion  11-13    Perceived Dyspnea  0-4      Progression   Progression  Continue to progress workloads to maintain intensity without signs/symptoms of physical distress.      Resistance Training   Training Prescription  Yes    Weight  2 lbs.    Reps  10-15       Perform Capillary Blood Glucose checks as needed.  Exercise Prescription Changes:   Exercise Comments:   Exercise Goals and Review:  Exercise Goals    Row Name 11/25/19 1148             Exercise Goals   Increase Physical Activity  Yes       Intervention  Provide advice, education, support and counseling about physical activity/exercise needs.;Develop an individualized exercise prescription for aerobic and resistive training based on initial evaluation findings, risk stratification, comorbidities and participant's personal goals.       Expected Outcomes  Short Term: Attend rehab on a regular basis to increase amount of physical activity.;Long Term: Exercising regularly at least 3-5 days a week.;Long Term: Add in home exercise to make exercise part of routine  and to increase amount of physical activity.       Increase Strength and Stamina  Yes       Intervention  Provide advice,  education, support and counseling about physical activity/exercise needs.;Develop an individualized exercise prescription for aerobic and resistive training based on initial evaluation findings, risk stratification, comorbidities and participant's personal goals.       Expected Outcomes  Short Term: Increase workloads from initial exercise prescription for resistance, speed, and METs.;Short Term: Perform resistance training exercises routinely during rehab and add in resistance training at home;Long Term: Improve cardiorespiratory fitness, muscular endurance and strength as measured by increased METs and functional capacity (6MWT)       Able to understand and use rate of perceived exertion (RPE) scale  Yes       Intervention  Provide education and explanation on how to use RPE scale       Expected Outcomes  Short Term: Able to use RPE daily in rehab to express subjective intensity level;Long Term:  Able to use RPE to guide intensity level when exercising independently       Knowledge and understanding of Target Heart Rate Range (THRR)  Yes       Intervention  Provide education and explanation of THRR including how the numbers were predicted and where they are located for reference       Expected Outcomes  Short Term: Able to state/look up THRR;Long Term: Able to use THRR to govern intensity when exercising independently;Short Term: Able to use daily as guideline for intensity in rehab       Able to check pulse independently  Yes       Intervention  Provide education and demonstration on how to check pulse in carotid and radial arteries.;Review the importance of being able to check your own pulse for safety during independent exercise       Expected Outcomes  Long Term: Able to check pulse independently and accurately;Short Term: Able to explain why pulse checking is important during independent exercise       Understanding of Exercise Prescription  Yes       Intervention  Provide education, explanation,  and written materials on patient's individual exercise prescription       Expected Outcomes  Short Term: Able to explain program exercise prescription;Long Term: Able to explain home exercise prescription to exercise independently          Exercise Goals Re-Evaluation :   Discharge Exercise Prescription (Final Exercise Prescription Changes):   Nutrition:  Target Goals: Understanding of nutrition guidelines, daily intake of sodium 1500mg , cholesterol 200mg , calories 30% from fat and 7% or less from saturated fats, daily to have 5 or more servings of fruits and vegetables.  Biometrics: Pre Biometrics - 11/25/19 1206      Pre Biometrics   Waist Circumference  31 inches    Hip Circumference  38 inches    Waist to Hip Ratio  0.82 %    Triceps Skinfold  26 mm    % Body Fat  35.5 %    Grip Strength  21 kg    Flexibility  11.25 in    Single Leg Stand  8.5 seconds        Nutrition Therapy Plan and Nutrition Goals:   Nutrition Assessments:   Nutrition Goals Re-Evaluation:   Nutrition Goals Re-Evaluation:   Nutrition Goals Discharge (Final Nutrition Goals Re-Evaluation):   Psychosocial: Target Goals: Acknowledge presence or absence of significant depression and/or stress, maximize coping  skills, provide positive support system. Participant is able to verbalize types and ability to use techniques and skills needed for reducing stress and depression.  Initial Review & Psychosocial Screening: Initial Psych Review & Screening - 11/25/19 1329      Family Dynamics   Comments  Patient has a positive attitude and outlook. She has a very strong support system including family, friends, and healthcare providers. She denies any psychosocial barriers to self health management or participation in CR.       Quality of Life Scores: Quality of Life - 11/25/19 1328      Quality of Life   Select  Quality of Life      Quality of Life Scores   Health/Function Pre  19.37 %     Socioeconomic Pre  29.58 %    Psych/Spiritual Pre  27.36 %    Family Pre  25.2 %    GLOBAL Pre  23.8 %      Scores of 19 and below usually indicate a poorer quality of life in these areas.  A difference of  2-3 points is a clinically meaningful difference.  A difference of 2-3 points in the total score of the Quality of Life Index has been associated with significant improvement in overall quality of life, self-image, physical symptoms, and general health in studies assessing change in quality of life.  PHQ-9: Recent Review Flowsheet Data    Depression screen [facility] 2/9 11/25/2019   Decreased Interest 0   Down, Depressed, Hopeless 0   PHQ - 2 Score 0     Interpretation of Total Score  Total Score Depression Severity:  1-4 = Minimal depression, 5-9 = Mild depression, 10-14 = Moderate depression, 15-19 = Moderately severe depression, 20-27 = Severe depression   Psychosocial Evaluation and Intervention:   Psychosocial Re-Evaluation:   Psychosocial Discharge (Final Psychosocial Re-Evaluation):   Vocational Rehabilitation: Provide vocational rehab assistance to qualifying candidates.   Vocational Rehab Evaluation & Intervention: Vocational Rehab - 11/25/19 1229      Initial Vocational Rehab Evaluation & Intervention   Assessment shows need for Vocational Rehabilitation  No       Education: Education Goals: Education classes will be provided on a weekly basis, covering required topics. Participant will state understanding/return demonstration of topics presented.  Learning Barriers/Preferences:   Education Topics: Count Your Pulse:  -Group instruction provided by verbal instruction, demonstration, patient participation and written materials to support subject.  Instructors address importance of being able to find your pulse and how to count your pulse when at home without a heart monitor.  Patients get hands on experience counting their pulse with staff help and  individually.   Heart Attack, Angina, and Risk Factor Modification:  -Group instruction provided by verbal instruction, video, and written materials to support subject.  Instructors address signs and symptoms of angina and heart attacks.    Also discuss risk factors for heart disease and how to make changes to improve heart health risk factors.   Functional Fitness:  -Group instruction provided by verbal instruction, demonstration, patient participation, and written materials to support subject.  Instructors address safety measures for doing things around the house.  Discuss how to get up and down off the floor, how to pick things up properly, how to safely get out of a chair without assistance, and balance training.   Meditation and Mindfulness:  -Group instruction provided by verbal instruction, patient participation, and written materials to support subject.  Instructor addresses importance of mindfulness and  meditation practice to help reduce stress and improve awareness.  Instructor also leads participants through a meditation exercise.    Stretching for Flexibility and Mobility:  -Group instruction provided by verbal instruction, patient participation, and written materials to support subject.  Instructors lead participants through series of stretches that are designed to increase flexibility thus improving mobility.  These stretches are additional exercise for major muscle groups that are typically performed during regular warm up and cool down.   Hands Only CPR:  -Group verbal, video, and participation provides a basic overview of AHA guidelines for community CPR. Role-play of emergencies allow participants the opportunity to practice calling for help and chest compression technique with discussion of AED use.   Hypertension: -Group verbal and written instruction that provides a basic overview of hypertension including the most recent diagnostic guidelines, risk factor reduction with  self-care instructions and medication management.    Nutrition I class: Heart Healthy Eating:  -Group instruction provided by PowerPoint slides, verbal discussion, and written materials to support subject matter. The instructor gives an explanation and review of the Therapeutic Lifestyle Changes diet recommendations, which includes a discussion on lipid goals, dietary fat, sodium, fiber, plant stanol/sterol esters, sugar, and the components of a well-balanced, healthy diet.   Nutrition II class: Lifestyle Skills:  -Group instruction provided by PowerPoint slides, verbal discussion, and written materials to support subject matter. The instructor gives an explanation and review of label reading, grocery shopping for heart health, heart healthy recipe modifications, and ways to make healthier choices when eating out.   Diabetes Question & Answer:  -Group instruction provided by PowerPoint slides, verbal discussion, and written materials to support subject matter. The instructor gives an explanation and review of diabetes co-morbidities, pre- and post-prandial blood glucose goals, pre-exercise blood glucose goals, signs, symptoms, and treatment of hypoglycemia and hyperglycemia, and foot care basics.   Diabetes Blitz:  -Group instruction provided by PowerPoint slides, verbal discussion, and written materials to support subject matter. The instructor gives an explanation and review of the physiology behind type 1 and type 2 diabetes, diabetes medications and rational behind using different medications, pre- and post-prandial blood glucose recommendations and Hemoglobin A1c goals, diabetes diet, and exercise including blood glucose guidelines for exercising safely.    Portion Distortion:  -Group instruction provided by PowerPoint slides, verbal discussion, written materials, and food models to support subject matter. The instructor gives an explanation of serving size versus portion size, changes in  portions sizes over the last 20 years, and what consists of a serving from each food group.   Stress Management:  -Group instruction provided by verbal instruction, video, and written materials to support subject matter.  Instructors review role of stress in heart disease and how to cope with stress positively.     Exercising on Your Own:  -Group instruction provided by verbal instruction, power point, and written materials to support subject.  Instructors discuss benefits of exercise, components of exercise, frequency and intensity of exercise, and end points for exercise.  Also discuss use of nitroglycerin and activating EMS.  Review options of places to exercise outside of rehab.  Review guidelines for sex with heart disease.   Cardiac Drugs I:  -Group instruction provided by verbal instruction and written materials to support subject.  Instructor reviews cardiac drug classes: antiplatelets, anticoagulants, beta blockers, and statins.  Instructor discusses reasons, side effects, and lifestyle considerations for each drug class.   Cardiac Drugs II:  -Group instruction provided by verbal instruction and written  materials to support subject.  Instructor reviews cardiac drug classes: angiotensin converting enzyme inhibitors (ACE-I), angiotensin II receptor blockers (ARBs), nitrates, and calcium channel blockers.  Instructor discusses reasons, side effects, and lifestyle considerations for each drug class.   Anatomy and Physiology of the Circulatory System:  Group verbal and written instruction and models provide basic cardiac anatomy and physiology, with the coronary electrical and arterial systems. Review of: AMI, Angina, Valve disease, Heart Failure, Peripheral Artery Disease, Cardiac Arrhythmia, Pacemakers, and the ICD.   Other Education:  -Group or individual verbal, written, or video instructions that support the educational goals of the cardiac rehab program.   Holiday Eating Survival  Tips:  -Group instruction provided by PowerPoint slides, verbal discussion, and written materials to support subject matter. The instructor gives patients tips, tricks, and techniques to help them not only survive but enjoy the holidays despite the onslaught of food that accompanies the holidays.   Knowledge Questionnaire Score: Knowledge Questionnaire Score - 11/25/19 1329      Knowledge Questionnaire Score   Pre Score  20/24       Core Components/Risk Factors/Patient Goals at Admission: Personal Goals and Risk Factors at Admission - 11/25/19 1331      Core Components/Risk Factors/Patient Goals on Admission   Heart Failure  Yes    Intervention  Provide a combined exercise and nutrition program that is supplemented with education, support and counseling about heart failure. Directed toward relieving symptoms such as shortness of breath, decreased exercise tolerance, and extremity edema.    Expected Outcomes  Improve functional capacity of life;Short term: Attendance in program 2-3 days a week with increased exercise capacity. Reported lower sodium intake. Reported increased fruit and vegetable intake. Reports medication compliance.;Short term: Daily weights obtained and reported for increase. Utilizing diuretic protocols set by physician.;Long term: Adoption of self-care skills and reduction of barriers for early signs and symptoms recognition and intervention leading to self-care maintenance.    Hypertension  Yes    Intervention  Provide education on lifestyle modifcations including regular physical activity/exercise, weight management, moderate sodium restriction and increased consumption of fresh fruit, vegetables, and low fat dairy, alcohol moderation, and smoking cessation.;Monitor prescription use compliance.    Expected Outcomes  Short Term: Continued assessment and intervention until BP is < 140/19mm HG in hypertensive participants. < 130/16mm HG in hypertensive participants with  diabetes, heart failure or chronic kidney disease.;Long Term: Maintenance of blood pressure at goal levels.       Core Components/Risk Factors/Patient Goals Review:    Core Components/Risk Factors/Patient Goals at Discharge (Final Review):    ITP Comments: ITP Comments    Row Name 11/25/19 1206           ITP Comments  Dr. Fransico Him, Medical director [facility] cardiac rehab          Comments: Patient attended orientation on 11/26/2019 to review rules and guidelines for program.  Completed 6 minute walk test, Intitial ITP, and exercise prescription.  VSS. Telemetry-NSR.  Asymptomatic. Safety measures and social distancing in place per CDC guidelines. Patient will begin her in person exercise sessions 12/08/19 in the 11:15 class. She will also participate in the [employer] virtual CR app on days she is not exercising in person.

## 2019-11-26 ENCOUNTER — Encounter (HOSPITAL_COMMUNITY)
Admission: RE | Admit: 2019-11-26 | Discharge: 2019-11-26 | Disposition: A | Discharge status: Home / Self Care | Payer: Self-pay | Source: Ambulatory Visit | Attending: Cardiology | Admitting: Cardiology

## 2019-11-26 DIAGNOSIS — I5022 Chronic systolic (congestive) heart failure: Secondary | ICD-10-CM | POA: Insufficient documentation

## 2019-11-26 NOTE — Progress Notes (Signed)
Virtual Cardiac Rehab Note:  Late entry for 11/25/19            Confirm Consent - In the setting of the current Covid19 crisis, you are scheduled for a phone visit with your Cardiac or Pulmonary team member.  Just as we do with many in-gym visits, in order for you to participate in this visit, we must obtain consent.  If you'd like, I can send this to your mychart (if signed up) or email for you to review.  Otherwise, I can obtain your verbal consent now.  By agreeing to a telephone visit, we'd like you to understand that the technology does not allow for your Cardiac or Pulmonary Rehab team member to perform a physical assessment, and thus may limit their ability to fully assess your ability to perform exercise programs. If your provider identifies any concerns that need to be evaluated in person, we will make arrangements to do so.  Finally, though the technology is pretty good, we cannot assure that it will always work on either your or our end and we cannot ensure that we have a secure connection.  Cardiac and Pulmonary Rehab Telehealth visits and "At Home" cardiac and pulmonary rehab are provided at no cost to you.        Are you willing to proceed?"        STAFF: Did the patient verbally acknowledge consent to telehealth visit? Document YES/NO here: Yes     Arieliz Latino E. [facility]   Cardiac and Pulmonary Rehab Staff        Date 11/25/19 @ Time 1200

## 2019-11-26 NOTE — Progress Notes (Addendum)
Virtual Cardiac Rehab Note:  Successful telephone encounter to Ms. Jeanne Bruce to follow up on elevated BP documented in the [facility] [employer] virtual cardiac rehab app. Patient recorded her morning BP as 172/92. Admits this was her awakening BP and she had not taken her coreg 3.125 prior to BP. She is not on an additional BP lowering medication. Patient able to recheck BP while on phone with RN, reports 166/87. Upon further investigation, patient reports daily home BP as follows beginning with morning of 11/17/19:  159/90 126/59 154/72 135/71 164/85 164/81 158/58 156/68 during CR orientation session 11/25/19  Patient denies s/s of hypertension. She is instructed to continue to monitor her BP daily, AM and PM. Will follow up with patient tomorrow. Patient has been educated on s/s of fluid overload and hypertension emergency. She understands to seek medical care if symptoms occur.  Plan: Will follow patient closely via [employer] app. Note will be routed to Dr. Aundra Dubin for review.  Tamyia Minich E. Rollene Rotunda RN, BSN [place]. [facility]  Cardiac and Pulmonary Rehabilitation [place] Direct: [phone]

## 2019-11-27 ENCOUNTER — Telehealth (HOSPITAL_COMMUNITY): Payer: Self-pay | Admitting: *Deleted

## 2019-11-27 NOTE — Telephone Encounter (Signed)
Received following message from [place], [place] w/cardiac rehab:  Benedetto Goad, RN  Registered Nurse  Cardiac Rehab  Progress Notes  Addendum  Date of Service:  11/26/2019 1:00 PM             Show:Clear all [x] Manual[x] Template[] Copied  Added by: [x] Benedetto Goad, RN  [] Hover for details Virtual Cardiac Rehab Note:  Successful telephone encounter to Ms. Jeanne Bruce to follow up on elevated BP documented in the [facility] virtual cardiac rehab app. Patient recorded her morning BP as 172/92. Admits this was her awakening BP and she had not taken her coreg 3.125 prior to BP. She is not on an additional BP lowering medication. Patient able to recheck BP while on phone with RN, reports 166/87. Upon further investigation, patient reports daily home BP as follows beginning with morning of 11/17/19:  159/90 126/59 154/72 135/71 164/85 164/81 158/58 156/68 during CR orientation session 11/25/19  Patient denies s/s of hypertension. She is instructed to continue to monitor her BP daily, AM and PM. Will follow up with patient tomorrow. Patient has been educated on s/s of fluid overload and hypertension emergency. She understands to seek medical care if symptoms occur.  Plan: Will follow patient closely via [facility] app. Note will be routed to Dr. Aundra Dubin for review.  Portia E. Rollene Rotunda RN, BSN [place]. [facility]  Cardiac and Pulmonary Rehabilitation [phone] D[place] Direct: [phone]     Will send to Dr Aundra Dubin for further review of pt's BP

## 2019-11-27 NOTE — Telephone Encounter (Signed)
She has had trouble with BP meds in the past (has not tolerated).  Would have her go ahead and increase Coreg to 6.25 mg bid.

## 2019-11-28 ENCOUNTER — Encounter (HOSPITAL_COMMUNITY)
Admission: RE | Admit: 2019-11-28 | Discharge: 2019-11-28 | Disposition: A | Discharge status: Home / Self Care | Payer: Self-pay | Source: Ambulatory Visit | Attending: Cardiology | Admitting: Cardiology

## 2019-11-28 ENCOUNTER — Other Ambulatory Visit: Payer: Self-pay

## 2019-11-28 DIAGNOSIS — I5022 Chronic systolic (congestive) heart failure: Secondary | ICD-10-CM | POA: Insufficient documentation

## 2019-11-28 MED ORDER — CARVEDILOL 6.25 MG PO TABS: 6.2500 mg | ORAL_TABLET | Freq: Two times a day (BID) | ORAL | 5 refills | Status: DC

## 2019-11-28 NOTE — Telephone Encounter (Signed)
Pt made aware to increase coreg to 6.25mg  twice a day for elevated blood pressure.  Pt advised to keep a log of blood pressure and bring with her to next appointment. Verbalized understanding.

## 2019-11-28 NOTE — Progress Notes (Signed)
Virtual Cardiac Rehab Note:  Successful telephone encounter to Ms. Laurna Tygart for BP follow up. Ms Tejera states her morning pressure is 162/85. Informed Ms. Effertz that according to EMR communication, Dr. Aundra Dubin has suggested she increase her Coreg to 6.25 BID. She is instructed to NOT increase her dose until she is contacted by the Heart Failure clinic. Patient verbalizes understanding.  Of note, patient states she has a significant amount of "head congestion" that started yesterday. She is also heard coughing during phone call. Patient denies being around anyone without utilizing the 3 Ws however she does keep her 72 year old grandson weekly. She denies fever or chills. She is encouraged to contact her PCP for further guidance and possible covid testing. Patient states she will just keep her symptoms watched for now. She is instructed to not take traditional over the counter cold medication as this could increase her BP. She states she has taken coricidin before however is now just taking robitussin for cough. Patient is educated on S/S of covid infection and when to seek emergency medical care. Patient states "I am aware".  Karthika Glasper E. Rollene Rotunda RN, BSN [place]. [facility]  Cardiac and Pulmonary Rehabilitation [place] Direct: [phone]

## 2019-12-02 ENCOUNTER — Telehealth (HOSPITAL_COMMUNITY): Payer: Self-pay

## 2019-12-02 NOTE — Telephone Encounter (Signed)
Virtual Cardiac Rehab Note:  Unsuccessful telephone encounter to Ms. Jeanne Bruce to follow up on recent medication increase and hypertension. Hipaa compliant VM message left requesting call back.  Ingra Rother E. Rollene Rotunda RN, BSN [place]. [facility]  Cardiac and Pulmonary Rehabilitation [place] Direct: [phone]

## 2019-12-04 ENCOUNTER — Telehealth (HOSPITAL_COMMUNITY): Payer: Self-pay | Admitting: *Deleted

## 2019-12-04 NOTE — Telephone Encounter (Signed)
Aprille reported via chat message on the Virtual Cardiac Rehab platform - [facility] the following:  BP 9:00pm 147/82 did 2051 steps today - 12/03/19 10:06 pm Cardiac rehab staff response-  Good Morning Jeanne Bruce. Looks like your blood pressure reading is moving in the right direction since increasing the Coreg. Continue to log your blood pressure in preperation for your upcoming appointment next Friday the 26th. Be sure with your logging to include the time of day and the arm that you are using. This is useful data for the physician to review. Stay safe! Mays Paino 12/04/19 9:49 am  Pt next onsite cardiac rehab exercise session is tomorrow 2/19 weather permitting.  Will continue to monitor pt bp here and logged bp from home. Cherre Huger, BSN Cardiac and Training and development officer

## 2019-12-08 ENCOUNTER — Encounter (HOSPITAL_COMMUNITY): Payer: BC Managed Care – PPO

## 2019-12-10 ENCOUNTER — Encounter (HOSPITAL_COMMUNITY)
Admission: RE | Admit: 2019-12-10 | Discharge: 2019-12-10 | Disposition: A | Discharge status: Home / Self Care | Payer: BC Managed Care – PPO | Source: Ambulatory Visit | Attending: Cardiology | Admitting: Cardiology

## 2019-12-10 ENCOUNTER — Other Ambulatory Visit: Payer: Self-pay

## 2019-12-10 ENCOUNTER — Telehealth (HOSPITAL_COMMUNITY): Payer: Self-pay

## 2019-12-10 DIAGNOSIS — I5022 Chronic systolic (congestive) heart failure: Secondary | ICD-10-CM | POA: Diagnosis not present

## 2019-12-10 NOTE — Progress Notes (Signed)
Daily Session Note  Patient Details  Name: Jeanne Bruce MRN: [id number] Date of Birth: 07/04/1956 Referring Provider:     [place] from 11/25/2019 in [place]  Referring Provider  Loralie Champagne, MD      Encounter Date: 12/10/2019  Check In: Session Check In - 12/10/19 1135      Check-In   Supervising physician immediately available to respond to emergencies  [facility] immediately available    Physician(s)  Dr. Cyndia Skeeters    Location  [facility]    Staff Present  Seward Carol, MS, ACSM CEP, Exercise Physiologist;Adriana Quinby Rollene Rotunda, RN, Toma Deiters, RN, BSN;Brittany Durene Fruits, BS, ACSM CEP, Exercise Physiologist    Virtual Visit  No    Medication changes reported      No    Fall or balance concerns reported     No    Tobacco Cessation  No Change    Warm-up and Cool-down  Performed on first and last piece of equipment    Resistance Training Performed  No    VAD Patient?  No    PAD/SET Patient?  No      Pain Assessment   Currently in Pain?  No/denies       Capillary Blood Glucose: No results found for this or any previous visit (from the past 24 hour(s)).    Social History   Tobacco Use  Smoking Status Never Smoker  Smokeless Tobacco Never Used    Goals Met:  Exercise tolerated well No report of cardiac concerns or symptoms  Goals Unmet:  Not Applicable  Comments: Pt started cardiac rehab today.  Pt tolerated light exercise without difficulty. VSS, telemetry-NSR, asymptomatic.  Medication list reconciled. Pt denies barriers to medicaiton compliance.  PSYCHOSOCIAL ASSESSMENT:  PHQ-0. Pt exhibits positive coping skills, hopeful outlook with supportive family. No psychosocial needs identified at this time, no psychosocial interventions necessary.  Pt oriented to exercise equipment and routine.    Understanding verbalized.   Dr. Fransico Him is Medical Director for Cardiac Rehab at [facility].

## 2019-12-10 NOTE — Telephone Encounter (Signed)
Cardiac Rehab Note:  Successful telephone encounter to Ms. Jeanne Bruce to confirm she remains asymptomatic from "cold" symptoms that developed 10 days ago. Ms. Jeanne Bruce states she has not had any symptoms since last Wednesdays. Patient's covid-19 screening this morning is negative. Patient cleared to return to CR exercise session today.  Jeanne Bruce E. Rollene Rotunda RN, BSN [place]. [facility]  Cardiac and Pulmonary Rehabilitation [place] Direct: [phone]

## 2019-12-12 ENCOUNTER — Ambulatory Visit (HOSPITAL_COMMUNITY)
Admission: RE | Admit: 2019-12-12 | Discharge: 2019-12-12 | Disposition: A | Discharge status: Home / Self Care | Payer: BC Managed Care – PPO | Source: Ambulatory Visit | Attending: Cardiology | Admitting: Cardiology

## 2019-12-12 ENCOUNTER — Encounter (HOSPITAL_COMMUNITY)
Admission: RE | Admit: 2019-12-12 | Discharge: 2019-12-12 | Disposition: A | Discharge status: Home / Self Care | Payer: BC Managed Care – PPO | Source: Ambulatory Visit | Attending: Cardiology | Admitting: Cardiology

## 2019-12-12 ENCOUNTER — Other Ambulatory Visit: Payer: Self-pay

## 2019-12-12 ENCOUNTER — Encounter (HOSPITAL_COMMUNITY): Payer: Self-pay

## 2019-12-12 DIAGNOSIS — I5022 Chronic systolic (congestive) heart failure: Secondary | ICD-10-CM | POA: Diagnosis not present

## 2019-12-12 MED ORDER — SPIRONOLACTONE 25 MG PO TABS: 12.5000 mg | ORAL_TABLET | Freq: Every day | ORAL | 3 refills | Status: DC

## 2019-12-12 NOTE — Progress Notes (Signed)
AVS discussed with patient. No questions endorsed. AVS and script mailed to patient.

## 2019-12-12 NOTE — Patient Instructions (Addendum)
START Spironolactone 12.5mg  (1/2 tab) daily   Labs in 2 weeks. A prescription was mailed to have this done locally.  We will only contact you if something comes back abnormal or we need to make some changes. Otherwise no news is good news!    Your physician has requested that you have an echocardiogram. Echocardiography is a painless test that uses sound waves to create images of your heart. It provides your doctor with information about the size and shape of your heart and how well your heart's chambers and valves are working. This procedure takes approximately one hour. There are no restrictions for this procedure.   Your physician recommends that you schedule a follow-up appointment in: April with Dr Aundra Dubin and an echo  Please call office at [phone] option 2 if you have any questions or concerns.   At the [facility], you and your health needs are our priority. As part of our continuing mission to provide you with exceptional heart care, we have created designated Provider Care Teams. These Care Teams include your primary Cardiologist (physician) and Advanced Practice Providers (APPs- Physician Assistants and Nurse Practitioners) who all work together to provide you with the care you need, when you need it.   You may see any of the following providers on your designated Care Team at your next follow up: Marland Kitchen Dr Glori Bickers . Dr Loralie Champagne . Darrick Grinder, NP . Lyda Jester, PA . Audry Riles, PharmD   Please be sure to bring in all your medications bottles to every appointment.

## 2019-12-14 NOTE — Progress Notes (Signed)
Heart Failure TeleHealth Note  Due to national recommendations of social distancing due to [name], Audio/video telehealth visit is felt to be most appropriate for this patient at this time.  See MyChart message from today for patient consent regarding telehealth for [facility].  Date:  12/14/2019   ID:  Jeanne Bruce, DOB June 30, 1956, MRN [id number]  Location: Home  Provider location: [place] Advanced Heart Failure Type of Visit: Established patient   PCP:  Derrill Center., MD  Cardiologist:  Fransico Him, MD HF Cardiolgoy: Dr Aundra Dubin  Chief Complaint: Fatigue   History of Present Illness: Jeanne Bruce is a 64 y.o. female who presents via audio/video conferencing for a telehealth visit today.     she denies symptoms worrisome for COVID 19.   She has a history of CKD stage 4, chronic systolic CHF, multiple sclerosis, and right nephrectomy.  She was referred by Dr. Radford Pax for CHF evaluation.  Patient also had remote colon cancer with colostomy.  She has had frequent UTIs and was admitted in 10/20 with urosepsis.  She had respiratory arrest and VF arrest in the ER and was intubated.  Echo was done, showing EF 25% with apical ballooning.  LHC showed no significant coronary disease.  She ended up having right nephrectomy for renal abscess in 10/20.    Echo was repeated in 12/20, showing EF still low at 25-30% with peri-apical akinesis.   She has had a hard time taking cardiac medications due to orthostatic symptoms with low BP.  She was sent home from the hospital in 10/20 on hydralazine/Imdur but had to stop due to low BP.    She is now going to cardiac rehab.  She is generally doing well. No significant exertional dyspnea and less fatigue.  Her BP generally tends to fluctuate up and down a lot.  Last reading was 138/74 at cardiac rehab. No lightheaded spells recently. No chest pain.  No orthopnea/PND.   Labs (11/20): creatinine 1.5 Labs (12/20): creatinine 2.47 Labs (1/21): creatinine 2.1 =>  2.04  PMH: 1. H/o right nephrectomy for renal abscess in 10/20.  2. Multiple Sclerosis 3. HTN 4. H/o colon cancer: s/p colectomy with colostomy.  5. CKD: Stage 4. Sees a nephrologist at [facility].  6. Frequent UTIs.  7. Chronic systolic CHF: Nonischemic cardiomyopathy.  - Echo (10/20): EF 25%, apical ballooning.  - LHC (10/20): Normal coronaries.  - Echo (12/20): EF 25-30%, peri-apical akinesis, normal RV size and systolic function (similar to 10/20 echo).    Social History   Socioeconomic History  . Marital status: Married    Spouse name: Not on file  . Number of children: Not on file  . Years of education: Not on file  . Highest education level: Not on file  Occupational History  . Not on file  Tobacco Use  . Smoking status: Never Smoker  . Smokeless tobacco: Never Used  Substance and Sexual Activity  . Alcohol use: Not Currently  . Drug use: Never  . Sexual activity: Not on file  Other Topics Concern  . Not on file  Social History Narrative  . Not on file   Social Determinants of Health   Financial Resource Strain:   . Difficulty of Paying Living Expenses: Not on file  Food Insecurity:   . Worried About Charity fundraiser in the Last Year: Not on file  . Ran Out of Food in the Last Year: Not on file  Transportation Needs: No Transportation Needs  . Lack  of Transportation (Medical): No  . Lack of Transportation (Non-Medical): No  Physical Activity: Insufficiently Active  . Days of Exercise per Week: 2 days  . Minutes of Exercise per Session: 10 min  Stress:   . Feeling of Stress : Not on file  Social Connections: Unknown  . Frequency of Communication with Friends and Family: More than three times a week  . Frequency of Social Gatherings with Friends and Family: Twice a week  . Attends Religious Services: More than 4 times per year  . Active Member of Clubs or Organizations: Not on file  . Attends Archivist Meetings: Not on file  . Marital Status:  Married  Human resources officer Violence:   . Fear of Current or Ex-Partner: Not on file  . Emotionally Abused: Not on file  . Physically Abused: Not on file  . Sexually Abused: Not on file   Family History  Problem Relation Age of Onset  . Diabetes Mellitus II Neg Hx    ROS: All systems reviewed and negative except as per HPI.   Current Outpatient Medications  Medication Sig Dispense Refill  . carvedilol (COREG) 6.25 MG tablet Take 1 tablet (6.25 mg total) by mouth 2 (two) times daily. Hold for systolic BP less than 850. 60 tablet 5  . cholecalciferol (VITAMIN D3) 25 MCG (1000 UT) tablet Take 2,000 Units by mouth daily.    Marland Kitchen conjugated estrogens (PREMARIN) vaginal cream Place 1 Applicatorful vaginally at bedtime.    Marland Kitchen escitalopram (LEXAPRO) 10 MG tablet Take 10 mg by mouth daily.    . magnesium oxide (MAG-OX) 400 MG tablet Take 400 mg by mouth daily.    . ondansetron (ZOFRAN) 4 MG tablet Take 4 mg by mouth every 8 (eight) hours as needed for nausea or vomiting.    . Probiotic Product (PROBIOTIC DAILY PO) Take 1 capsule by mouth daily.    . solifenacin (VESICARE) 5 MG tablet Take by mouth.    . spironolactone (ALDACTONE) 25 MG tablet Take 0.5 tablets (12.5 mg total) by mouth daily. 45 tablet 3  . UNABLE TO FIND Core Power nutritional supplement.     No current facility-administered medications for this encounter.   Exam:  (Video/Tele Health Call; Exam is subjective and or/visual.) General:  Speaks in full sentences. No resp difficulty. Lungs: Normal respiratory effort with conversation.  Abdomen: Non-distended per patient report Extremities: Pt denies edema. Neuro: Alert & oriented x 3.   Assessment/Plan:  1. Chronic systolic CHF: Nonischemic cardiomyopathy, LHC in 10/20 with no significant coronary disease.  Echo in 10/20 was suggestive of stress (Takotsubo-type) cardiomyopathy with apical ballooning (from severe urosepsis).  However, LV function did not improve on repeat echo in  12/20.  NYHA class II symptoms.  She has had a hard time tolerating cardiac meds due to orthostasis, but this has been improved recently.   - Continue Coreg 6.25 mg bid.   - No ACEI/ARNI/ARB for now with CKD stage 4.  - I will have her start spironolactone 12.5 mg daily with BMET 2 wks.   - Continue cardiac rehab.  - I would like to reassess EF 6 months after initial event in 10/20 (4/21).  If EF has not recovered, will need to consider CRT-D device (she has chronic LBBB, QRS 150 msec). Would like to do cardiac MRI but creatinine may be an issue so will most likely get echo.  2. CKD stage 4: S/p right nephrectomy in 10/20.  [facility] nephrology [facility].   COVID  screen The patient does not have any symptoms that suggest any further testing/ screening at this time.  Social distancing reinforced today.  Patient Risk: After full review of this patients clinical status, I feel that they are at moderate risk for cardiac decompensation at this time.  Relevant cardiac medications were reviewed at length with the patient today. The patient does not have concerns regarding their medications at this time.   Recommended follow-up:  4/21 with echo.   Today, I have spent 17 minutes with the patient with telehealth technology discussing the above issues .    Signed, Loralie Champagne, MD  12/14/2019  [place] [address] [facility] [place] [address] [phone] (office) [phone] (fax)

## 2019-12-15 ENCOUNTER — Other Ambulatory Visit: Payer: Self-pay

## 2019-12-15 ENCOUNTER — Encounter (HOSPITAL_COMMUNITY)
Admission: RE | Admit: 2019-12-15 | Discharge: 2019-12-15 | Disposition: A | Discharge status: Home / Self Care | Payer: BC Managed Care – PPO | Source: Ambulatory Visit | Attending: Cardiology | Admitting: Cardiology

## 2019-12-15 DIAGNOSIS — I5022 Chronic systolic (congestive) heart failure: Secondary | ICD-10-CM | POA: Insufficient documentation

## 2019-12-17 ENCOUNTER — Encounter (HOSPITAL_COMMUNITY)
Admission: RE | Admit: 2019-12-17 | Discharge: 2019-12-17 | Disposition: A | Discharge status: Home / Self Care | Payer: BC Managed Care – PPO | Source: Ambulatory Visit | Attending: Cardiology | Admitting: Cardiology

## 2019-12-17 ENCOUNTER — Other Ambulatory Visit: Payer: Self-pay

## 2019-12-17 DIAGNOSIS — I5022 Chronic systolic (congestive) heart failure: Secondary | ICD-10-CM

## 2019-12-19 ENCOUNTER — Encounter (HOSPITAL_COMMUNITY)
Admission: RE | Admit: 2019-12-19 | Discharge: 2019-12-19 | Disposition: A | Discharge status: Home / Self Care | Payer: BC Managed Care – PPO | Source: Ambulatory Visit | Attending: Cardiology | Admitting: Cardiology

## 2019-12-19 ENCOUNTER — Other Ambulatory Visit: Payer: Self-pay

## 2019-12-19 DIAGNOSIS — I5022 Chronic systolic (congestive) heart failure: Secondary | ICD-10-CM

## 2019-12-22 ENCOUNTER — Encounter (HOSPITAL_COMMUNITY)
Admission: RE | Admit: 2019-12-22 | Discharge: 2019-12-22 | Disposition: A | Discharge status: Home / Self Care | Payer: BC Managed Care – PPO | Source: Ambulatory Visit | Attending: Cardiology | Admitting: Cardiology

## 2019-12-22 ENCOUNTER — Other Ambulatory Visit: Payer: Self-pay

## 2019-12-22 DIAGNOSIS — I5022 Chronic systolic (congestive) heart failure: Secondary | ICD-10-CM | POA: Diagnosis not present

## 2019-12-22 NOTE — Progress Notes (Signed)
Nobuko Feeback 64 y.o. female Nutrition Note  Visit Diagnosis: Chronic systolic CHF (congestive heart failure) (HCC)  Past Medical History:  Diagnosis Date  . Cancer ([place])    colon (resolved)  . Hypertension   . Multiple sclerosis ([place])   . Renal disorder      Medications reviewed.   Current Outpatient Medications:  .  carvedilol (COREG) 6.25 MG tablet, Take 1 tablet (6.25 mg total) by mouth 2 (two) times daily. Hold for systolic BP less than 606., Disp: 60 tablet, Rfl: 5 .  cholecalciferol (VITAMIN D3) 25 MCG (1000 UT) tablet, Take 2,000 Units by mouth daily., Disp: , Rfl:  .  conjugated estrogens (PREMARIN) vaginal cream, Place 1 Applicatorful vaginally at bedtime., Disp: , Rfl:  .  escitalopram (LEXAPRO) 10 MG tablet, Take 10 mg by mouth daily., Disp: , Rfl:  .  magnesium oxide (MAG-OX) 400 MG tablet, Take 400 mg by mouth daily., Disp: , Rfl:  .  ondansetron (ZOFRAN) 4 MG tablet, Take 4 mg by mouth every 8 (eight) hours as needed for nausea or vomiting., Disp: , Rfl:  .  Probiotic Product (PROBIOTIC DAILY PO), Take 1 capsule by mouth daily., Disp: , Rfl:  .  solifenacin (VESICARE) 5 MG tablet, Take by mouth., Disp: , Rfl:  .  spironolactone (ALDACTONE) 25 MG tablet, Take 0.5 tablets (12.5 mg total) by mouth daily., Disp: 45 tablet, Rfl: 3 .  UNABLE TO FIND, Core Power nutritional supplement., Disp: , Rfl:    Ht Readings from Last 1 Encounters:  11/25/19 5\' 1"  (1.549 m)     Wt Readings from Last 3 Encounters:  11/25/19 127 lb 3.3 oz (57.7 kg)  11/07/19 116 lb 3.2 oz (52.7 kg)  09/16/19 112 lb (50.8 kg)     There is no height or weight on file to calculate BMI.   Social History   Tobacco Use  Smoking Status Never Smoker  Smokeless Tobacco Never Used     Lab Results  Component Value Date   CHOL 160 08/11/2019   Lab Results  Component Value Date   HDL 35 (L) 08/11/2019   Lab Results  Component Value Date   LDLCALC 92 08/11/2019   Lab Results  Component Value  Date   TRIG 165 (H) 08/11/2019   Lab Results  Component Value Date   CHOLHDL 4.6 08/11/2019     Lab Results  Component Value Date   HGBA1C 4.6 (L) 08/11/2019     CBG (last 3)  No results for input(s): GLUCAP in the last 72 hours.   Nutrition Note  Spoke with pt. Nutrition Plan and Nutrition Survey goals reviewed with pt.   Pt with CKD stage 4 and removal of right kidney, hx colon cancer with colostomy, CHF with Ef 25-30%. Recommended a low sodium diet. Murry reports eating small amounts of cooked veggies and salad but avoids normal to large portions of these foods. She is able to eat whole grains and fruits without digestive issues.  Beya considers sodium content on labels and avoids the salt shaker but is not confident in label reading. She does not have any specific nutrition goals but is open to further information.  Pt with dx of CHF. Per discussion, pt does use canned/convenience foods often. Pt does not add salt to food. Pt does not eat out frequently.   Pt expressed understanding of the information reviewed.    Nutrition Diagnosis Food-and nutrition-related knowledge deficit related to lack of exposure to information as related to diagnosis  of: ? CHF?  CKD  Nutrition Intervention ? Pt's individual nutrition plan reviewed with pt. ? Benefits of adopting Heart Healthy diet discussed when Medficts reviewed.   ? Continue client-centered nutrition education by RD, as part of interdisciplinary care.  Goal(s)  ? Pt to build a healthy plate including vegetables, fruits, whole grains, and low-fat dairy products in a heart healthy meal plan. ? Pt to learn to read labels and make appropriate food choices for heart health and CKD  Plan:   Will provide client-centered nutrition education as part of interdisciplinary care  Monitor and evaluate progress toward nutrition goal with team.   Michaele Offer, MS, RDN, LDN

## 2019-12-24 ENCOUNTER — Encounter (HOSPITAL_COMMUNITY)
Admission: RE | Admit: 2019-12-24 | Discharge: 2019-12-24 | Disposition: A | Discharge status: Home / Self Care | Payer: BC Managed Care – PPO | Source: Ambulatory Visit | Attending: Cardiology | Admitting: Cardiology

## 2019-12-24 ENCOUNTER — Other Ambulatory Visit: Payer: Self-pay

## 2019-12-24 DIAGNOSIS — I5022 Chronic systolic (congestive) heart failure: Secondary | ICD-10-CM | POA: Diagnosis not present

## 2019-12-25 NOTE — Progress Notes (Signed)
Cardiac Individual Treatment Plan  Patient Details  Name: Jeanne Bruce MRN: [id number] Date of Birth: [birth year] Referring Provider:     CARDIAC REHAB PHASE II ORIENTATION from 11/25/2019 in [place]  Referring Provider  Loralie Champagne, MD      Initial Encounter Date:    CARDIAC REHAB PHASE II ORIENTATION from 11/25/2019 in [facility]  Date  11/25/19      Visit Diagnosis: Chronic systolic CHF (congestive heart failure) ([place])  Patient's Home Medications on Admission:  Current Outpatient Medications:  .  carvedilol (COREG) 6.25 MG tablet, Take 1 tablet (6.25 mg total) by mouth 2 (two) times daily. Hold for systolic BP less than 656., Disp: 60 tablet, Rfl: 5 .  cholecalciferol (VITAMIN D3) 25 MCG (1000 UT) tablet, Take 2,000 Units by mouth daily., Disp: , Rfl:  .  conjugated estrogens (PREMARIN) vaginal cream, Place 1 Applicatorful vaginally at bedtime., Disp: , Rfl:  .  escitalopram (LEXAPRO) 10 MG tablet, Take 10 mg by mouth daily., Disp: , Rfl:  .  magnesium oxide (MAG-OX) 400 MG tablet, Take 400 mg by mouth daily., Disp: , Rfl:  .  ondansetron (ZOFRAN) 4 MG tablet, Take 4 mg by mouth every 8 (eight) hours as needed for nausea or vomiting., Disp: , Rfl:  .  Probiotic Product (PROBIOTIC DAILY PO), Take 1 capsule by mouth daily., Disp: , Rfl:  .  solifenacin (VESICARE) 5 MG tablet, Take by mouth., Disp: , Rfl:  .  spironolactone (ALDACTONE) 25 MG tablet, Take 0.5 tablets (12.5 mg total) by mouth daily., Disp: 45 tablet, Rfl: 3 .  UNABLE TO FIND, [employer] nutritional supplement., Disp: , Rfl:   Past Medical History: Past Medical History:  Diagnosis Date  . Cancer ([place])    colon (resolved)  . Hypertension   . Multiple sclerosis ([place])   . Renal disorder     Tobacco Use: Social History   Tobacco Use  Smoking Status Never Smoker  Smokeless Tobacco Never Used    Labs: Recent Review Flowsheet Data    Labs for ITP  Cardiac and Pulmonary Rehab Latest Ref Rng & Units 08/11/2019 08/11/2019 08/11/2019 08/11/2019 08/11/2019   Cholestrol 0 - 200 mg/dL - 160 - - -   LDLCALC 0 - 99 mg/dL - 92 - - -   HDL >40 mg/dL - 35(L) - - -   Trlycerides <150 mg/dL - 165(H) - - -   Hemoglobin A1c 4.8 - 5.6 % 4.5(L) - - - 4.6(L)   PHART 7.350 - 7.450 - - 7.616(HH) 7.488(H) -   PCO2ART 32.0 - 48.0 mmHg - - 26.0(L) 34.0 -   HCO3 20.0 - 28.0 mmol/L - - 26.5 25.7 -   TCO2 22 - 32 mmol/L - - 27 27 -   O2SAT % - - 100.0 100.0 -      Capillary Blood Glucose: Lab Results  Component Value Date   GLUCAP 88 08/14/2019   GLUCAP 104 (H) 08/14/2019   GLUCAP 105 (H) 08/14/2019   GLUCAP 124 (H) 08/13/2019   GLUCAP 135 (H) 08/13/2019     Exercise Target Goals: Exercise Program Goal: Individual exercise prescription set using results from initial 6 min walk test and THRR while considering  patient's activity barriers and safety.   Exercise Prescription Goal: Starting with aerobic activity 30 plus minutes a day, 3 days per week for initial exercise prescription. Provide home exercise prescription and guidelines that participant acknowledges understanding prior to discharge.  Activity Barriers & Risk Stratification: Activity Barriers & Cardiac Risk Stratification - 11/25/19 1145      Activity Barriers & Cardiac Risk Stratification   Activity Barriers  Other (comment);Assistive Device    Comments  Multiple sclerosis- right side weaker than left.    Cardiac Risk Stratification  High       6 Minute Walk: 6 Minute Walk    Row Name 11/25/19 1157         6 Minute Walk   Phase  Initial     Distance  1243 feet     Walk Time  6 minutes     # of Rest Breaks  0     MPH  2.35     METS  3.08     RPE  11     Perceived Dyspnea   1     VO2 Peak  10.79     Symptoms  Yes (comment)     Comments  Mild SOB     Resting HR  64 bpm     Resting BP  156/68     Resting Oxygen Saturation   99 %     Exercise Oxygen Saturation  during  6 min walk  97 %     Max Ex. HR  94 bpm     Max Ex. BP  134/78     2 Minute Post BP  122/78        Oxygen Initial Assessment:   Oxygen Re-Evaluation:   Oxygen Discharge (Final Oxygen Re-Evaluation):   Initial Exercise Prescription: Initial Exercise Prescription - 11/25/19 1300      Date of Initial Exercise RX and Referring Provider   Date  11/25/19    Referring Provider  Loralie Champagne, MD    Expected Discharge Date  01/30/20      Recumbant Bike   Level  2    Watts  15    Minutes  15    METs  2.83      NuStep   Level  2    SPM  85    Minutes  15    METs  2      Prescription Details   Frequency (times per week)  3    Duration  Progress to 30 minutes of continuous aerobic without signs/symptoms of physical distress      Intensity   THRR 40-80% of Max Heartrate  62-124    Ratings of Perceived Exertion  11-13    Perceived Dyspnea  0-4      Progression   Progression  Continue to progress workloads to maintain intensity without signs/symptoms of physical distress.      Resistance Training   Training Prescription  Yes    Weight  2 lbs.    Reps  10-15       Perform Capillary Blood Glucose checks as needed.  Exercise Prescription Changes: Exercise Prescription Changes    Row Name 12/10/19 0815 12/22/19 1337           Response to Exercise   Blood Pressure (Admit)  130/74  120/64      Blood Pressure (Exercise)  148/80  108/80      Blood Pressure (Exit)  130/62  112/60      Heart Rate (Admit)  81 bpm  88 bpm      Heart Rate (Exercise)  97 bpm  112 bpm      Heart Rate (Exit)  83 bpm  93 bpm  Rating of Perceived Exertion (Exercise)  12  12      Perceived Dyspnea (Exercise)  0  0      Symptoms  None  None      Comments  Pt's first day of exercise  None      Duration  Progress to 10 minutes continuous walking  at current work load and total walking time to 30-45 min  Progress to 30 minutes of  aerobic without signs/symptoms of physical distress       Intensity  THRR unchanged  THRR unchanged        Progression   Progression  Continue to progress workloads to maintain intensity without signs/symptoms of physical distress.  Continue to progress workloads to maintain intensity without signs/symptoms of physical distress.      Average METs  1.9  2        Resistance Training   Training Prescription  Yes  Yes      Weight  2 lbs.  2 lbs.      Reps  10-15  10-15      Time  10 Minutes  10 Minutes        Recumbant Bike   Level  2  2      Watts  10  10      Minutes  15  15      METs  2.1  2.3        NuStep   Level  2  2      SPM  70  70      Minutes  15  15      METs  1.7  1.7         Exercise Comments: Exercise Comments    Row Name 12/10/19 0836 12/25/19 1338         Exercise Comments  Pt's first day of exercise. Pt responded well to exercise prescription. Will continue to increae workloads as tolerated.  Pt is continuing to respond well to exercise prescription. Will follow up with pt regarding home exercise plan.         Exercise Goals and Review: Exercise Goals    Row Name 11/25/19 1148             Exercise Goals   Increase Physical Activity  Yes       Intervention  Provide advice, education, support and counseling about physical activity/exercise needs.;Develop an individualized exercise prescription for aerobic and resistive training based on initial evaluation findings, risk stratification, comorbidities and participant's personal goals.       Expected Outcomes  Short Term: Attend rehab on a regular basis to increase amount of physical activity.;Long Term: Exercising regularly at least 3-5 days a week.;Long Term: Add in home exercise to make exercise part of routine and to increase amount of physical activity.       Increase Strength and Stamina  Yes       Intervention  Provide advice, education, support and counseling about physical activity/exercise needs.;Develop an individualized exercise prescription for aerobic  and resistive training based on initial evaluation findings, risk stratification, comorbidities and participant's personal goals.       Expected Outcomes  Short Term: Increase workloads from initial exercise prescription for resistance, speed, and METs.;Short Term: Perform resistance training exercises routinely during rehab and add in resistance training at home;Long Term: Improve cardiorespiratory fitness, muscular endurance and strength as measured by increased METs and functional capacity (6MWT)       Able to  understand and use rate of perceived exertion (RPE) scale  Yes       Intervention  Provide education and explanation on how to use RPE scale       Expected Outcomes  Short Term: Able to use RPE daily in rehab to express subjective intensity level;Long Term:  Able to use RPE to guide intensity level when exercising independently       Knowledge and understanding of Target Heart Rate Range (THRR)  Yes       Intervention  Provide education and explanation of THRR including how the numbers were predicted and where they are located for reference       Expected Outcomes  Short Term: Able to state/look up THRR;Long Term: Able to use THRR to govern intensity when exercising independently;Short Term: Able to use daily as guideline for intensity in rehab       Able to check pulse independently  Yes       Intervention  Provide education and demonstration on how to check pulse in carotid and radial arteries.;Review the importance of being able to check your own pulse for safety during independent exercise       Expected Outcomes  Long Term: Able to check pulse independently and accurately;Short Term: Able to explain why pulse checking is important during independent exercise       Understanding of Exercise Prescription  Yes       Intervention  Provide education, explanation, and written materials on patient's individual exercise prescription       Expected Outcomes  Short Term: Able to explain program  exercise prescription;Long Term: Able to explain home exercise prescription to exercise independently          Exercise Goals Re-Evaluation :    Discharge Exercise Prescription (Final Exercise Prescription Changes): Exercise Prescription Changes - 12/22/19 1337      Response to Exercise   Blood Pressure (Admit)  120/64    Blood Pressure (Exercise)  108/80    Blood Pressure (Exit)  112/60    Heart Rate (Admit)  88 bpm    Heart Rate (Exercise)  112 bpm    Heart Rate (Exit)  93 bpm    Rating of Perceived Exertion (Exercise)  12    Perceived Dyspnea (Exercise)  0    Symptoms  None    Comments  None    Duration  Progress to 30 minutes of  aerobic without signs/symptoms of physical distress    Intensity  THRR unchanged      Progression   Progression  Continue to progress workloads to maintain intensity without signs/symptoms of physical distress.    Average METs  2      Resistance Training   Training Prescription  Yes    Weight  2 lbs.    Reps  10-15    Time  10 Minutes      Recumbant Bike   Level  2    Watts  10    Minutes  15    METs  2.3      NuStep   Level  2    SPM  70    Minutes  15    METs  1.7       Nutrition:  Target Goals: Understanding of nutrition guidelines, daily intake of sodium 1500mg , cholesterol 200mg , calories 30% from fat and 7% or less from saturated fats, daily to have 5 or more servings of fruits and vegetables.  Biometrics: Pre Biometrics - 11/25/19 1206  Pre Biometrics   Waist Circumference  31 inches    Hip Circumference  38 inches    Waist to Hip Ratio  0.82 %    Triceps Skinfold  26 mm    % Body Fat  35.5 %    Grip Strength  21 kg    Flexibility  11.25 in    Single Leg Stand  8.5 seconds        Nutrition Therapy Plan and Nutrition Goals: Nutrition Therapy & Goals - 12/22/19 1506      Nutrition Therapy   Diet  Low Sodium      Personal Nutrition Goals   Nutrition Goal  Pt to build a healthy plate including  vegetables, fruits, whole grains, and low-fat dairy products in a heart healthy meal plan.    Personal Goal #2  Pt to learn to read labels and make appropriate food choices for heart health and CKD      Intervention Plan   Intervention  Prescribe, educate and counsel regarding individualized specific dietary modifications aiming towards targeted core components such as weight, hypertension, lipid management, diabetes, heart failure and other comorbidities.    Expected Outcomes  Short Term Goal: Understand basic principles of dietary content, such as calories, fat, sodium, cholesterol and nutrients.       Nutrition Assessments: Nutrition Assessments - 12/22/19 1507      MEDFICTS Scores   Pre Score  33       Nutrition Goals Re-Evaluation: Nutrition Goals Re-Evaluation    Row Name 12/22/19 1506             Goals   Current Weight  127 lb (57.6 kg)       Expected Outcome  Ability to read labels and make heart healthy and kidney friendly food choices          Nutrition Goals Discharge (Final Nutrition Goals Re-Evaluation): Nutrition Goals Re-Evaluation - 12/22/19 1506      Goals   Current Weight  127 lb (57.6 kg)    Expected Outcome  Ability to read labels and make heart healthy and kidney friendly food choices       Psychosocial: Target Goals: Acknowledge presence or absence of significant depression and/or stress, maximize coping skills, provide positive support system. Participant is able to verbalize types and ability to use techniques and skills needed for reducing stress and depression.  Initial Review & Psychosocial Screening: Initial Psych Review & Screening - 11/25/19 1329      Family Dynamics   Comments  Patient has a positive attitude and outlook. She has a very strong support system including family, friends, and healthcare providers. She denies any psychosocial barriers to self health management or participation in CR.       Quality of Life Scores: Quality  of Life - 11/25/19 1328      Quality of Life   Select  Quality of Life      Quality of Life Scores   Health/Function Pre  19.37 %    Socioeconomic Pre  29.58 %    Psych/Spiritual Pre  27.36 %    Family Pre  25.2 %    GLOBAL Pre  23.8 %      Scores of 19 and below usually indicate a poorer quality of life in these areas.  A difference of  2-3 points is a clinically meaningful difference.  A difference of 2-3 points in the total score of the Quality of Life Index has been associated with significant improvement  in overall quality of life, self-image, physical symptoms, and general health in studies assessing change in quality of life.  PHQ-9: Recent Review Flowsheet Data    Depression screen [facility] 2/9 11/25/2019   Decreased Interest 0   Down, Depressed, Hopeless 0   PHQ - 2 Score 0     Interpretation of Total Score  Total Score Depression Severity:  1-4 = Minimal depression, 5-9 = Mild depression, 10-14 = Moderate depression, 15-19 = Moderately severe depression, 20-27 = Severe depression   Psychosocial Evaluation and Intervention: Psychosocial Evaluation - 12/11/19 0725      Psychosocial Evaluation & Interventions   Interventions  Encouraged to exercise with the program and follow exercise prescription    Comments  Ms. Chisolm continues to have a positive attitude and outlook. She has a very strong support system of family and friends. She enjoys spending time with her grandchildren and going to the gym. She continues to deny psychosocial barriers to participation in CR. No interventions needed at this time.    Expected Outcomes  Ms. Sargent will contine to have a positive attitude and outlook. She will utilize her support system and hobbies to manage any barriers to participation in CR that may arise.    Continue Psychosocial Services   No Follow up required       Psychosocial Re-Evaluation: Psychosocial Re-Evaluation    [place] Name 12/23/19 1255             Psychosocial  Re-Evaluation   Current issues with  None Identified       Comments  Ms. Earlywine continues to deny psychosocial barriers to participation in CR. She continues to have a positive attitude and outlook. She says as her health improves so does her desire to continue to exercise and make lifestyle changes. She has a strong support system including family and a caregiver. She loves spending time with her grandchild. No interventions needed at this time.       Expected Outcomes  Ms. Burks will continue to deny psychosocial barriers to self health management and participation in cardiac rehab. She will continue maintain a positive attitude and outlook.       Interventions  Encouraged to attend Cardiac Rehabilitation for the exercise       Continue Psychosocial Services   No Follow up required          Psychosocial Discharge (Final Psychosocial Re-Evaluation): Psychosocial Re-Evaluation - 12/23/19 1255      Psychosocial Re-Evaluation   Current issues with  None Identified    Comments  Ms. Mcinnis continues to deny psychosocial barriers to participation in CR. She continues to have a positive attitude and outlook. She says as her health improves so does her desire to continue to exercise and make lifestyle changes. She has a strong support system including family and a caregiver. She loves spending time with her grandchild. No interventions needed at this time.    Expected Outcomes  Ms. Stuck will continue to deny psychosocial barriers to self health management and participation in cardiac rehab. She will continue maintain a positive attitude and outlook.    Interventions  Encouraged to attend Cardiac Rehabilitation for the exercise    Continue Psychosocial Services   No Follow up required       Vocational Rehabilitation: Provide vocational rehab assistance to qualifying candidates.   Vocational Rehab Evaluation & Intervention: Vocational Rehab - 11/25/19 1229      Initial Vocational Rehab Evaluation &  Intervention   Assessment shows need  for Vocational Rehabilitation  No       Education: Education Goals: Education classes will be provided on a weekly basis, covering required topics. Participant will state understanding/return demonstration of topics presented.  Learning Barriers/Preferences:   Education Topics: Hypertension, Hypertension Reduction -Define heart disease and high blood pressure. Discus how high blood pressure affects the body and ways to reduce high blood pressure.   Exercise and Your Heart -Discuss why it is important to exercise, the FITT principles of exercise, normal and abnormal responses to exercise, and how to exercise safely.   Angina -Discuss definition of angina, causes of angina, treatment of angina, and how to decrease risk of having angina.   Cardiac Medications -Review what the following cardiac medications are used for, how they affect the body, and side effects that may occur when taking the medications.  Medications include Aspirin, Beta blockers, calcium channel blockers, ACE Inhibitors, angiotensin receptor blockers, diuretics, digoxin, and antihyperlipidemics.   Congestive Heart Failure -Discuss the definition of CHF, how to live with CHF, the signs and symptoms of CHF, and how keep track of weight and sodium intake.   Heart Disease and Intimacy -Discus the effect sexual activity has on the heart, how changes occur during intimacy as we age, and safety during sexual activity.   Smoking Cessation / COPD -Discuss different methods to quit smoking, the health benefits of quitting smoking, and the definition of COPD.   Nutrition I: Fats -Discuss the types of cholesterol, what cholesterol does to the heart, and how cholesterol levels can be controlled.   Nutrition II: Labels -Discuss the different components of food labels and how to read food label   Heart Parts/Heart Disease and PAD -Discuss the anatomy of the heart, the pathway of  blood circulation through the heart, and these are affected by heart disease.   Stress I: Signs and Symptoms -Discuss the causes of stress, how stress may lead to anxiety and depression, and ways to limit stress.   Stress II: Relaxation -Discuss different types of relaxation techniques to limit stress.   Warning Signs of Stroke / TIA -Discuss definition of a stroke, what the signs and symptoms are of a stroke, and how to identify when someone is having stroke.   Knowledge Questionnaire Score: Knowledge Questionnaire Score - 11/25/19 1329      Knowledge Questionnaire Score   Pre Score  20/24       Core Components/Risk Factors/Patient Goals at Admission: Personal Goals and Risk Factors at Admission - 11/25/19 1331      Core Components/Risk Factors/Patient Goals on Admission   Heart Failure  Yes    Intervention  Provide a combined exercise and nutrition program that is supplemented with education, support and counseling about heart failure. Directed toward relieving symptoms such as shortness of breath, decreased exercise tolerance, and extremity edema.    Expected Outcomes  Improve functional capacity of life;Short term: Attendance in program 2-3 days a week with increased exercise capacity. Reported lower sodium intake. Reported increased fruit and vegetable intake. Reports medication compliance.;Short term: Daily weights obtained and reported for increase. Utilizing diuretic protocols set by physician.;Long term: Adoption of self-care skills and reduction of barriers for early signs and symptoms recognition and intervention leading to self-care maintenance.    Hypertension  Yes    Intervention  Provide education on lifestyle modifcations including regular physical activity/exercise, weight management, moderate sodium restriction and increased consumption of fresh fruit, vegetables, and low fat dairy, alcohol moderation, and smoking cessation.;Monitor prescription use compliance.  Expected Outcomes  Short Term: Continued assessment and intervention until BP is < 140/26mm HG in hypertensive participants. < 130/50mm HG in hypertensive participants with diabetes, heart failure or chronic kidney disease.;Long Term: Maintenance of blood pressure at goal levels.       Core Components/Risk Factors/Patient Goals Review:  Goals and Risk Factor Review    Row Name 12/10/19 1227 12/23/19 1300           Core Components/Risk Factors/Patient Goals Review   Personal Goals Review  Heart Failure;Hypertension  Heart Failure;Hypertension      Review  Ms. Barcelo has several CAD risk factors. She is eager to participate in CR to reduce her risk factors and improve her quality of life.  Ms. Durrett has several CAD risk factors. She is taking her medications as prescribed and her BP is now controlled with the increase dosage of coreg. She weighs daily and follows the HF action plan. She is beginning to exercise at home by walking.      Expected Outcomes  Ms Pola will continue to participate in CR exercise sessions.  Ms Obi will continue to participate in CR exercise sessions.         Core Components/Risk Factors/Patient Goals at Discharge (Final Review):  Goals and Risk Factor Review - 12/23/19 1300      Core Components/Risk Factors/Patient Goals Review   Personal Goals Review  Heart Failure;Hypertension    Review  Ms. Granville has several CAD risk factors. She is taking her medications as prescribed and her BP is now controlled with the increase dosage of coreg. She weighs daily and follows the HF action plan. She is beginning to exercise at home by walking.    Expected Outcomes  Ms Golda will continue to participate in CR exercise sessions.       ITP Comments: ITP Comments    Row Name 11/25/19 1206 12/10/19 1223 12/23/19 1237       ITP Comments  Dr. Fransico Him, Medical director [facility] cardiac rehab  Ms. Whisner completed her first cardiac rehab exercise session today and tolerated very well.  VSS. Patient denied complaints.  30 day ITP review: Ms. Guitron is doing very well in CR. She is working to an RPE of 11-13 and feels her stamina and strength are returning. Her BP is much improved with increasing her coreg to 6.25. All vital remain stable. Ms. Matsuura denies cardiac complaints or concerns at this time. She is eager to continue to participate in CR.        Comments: See ITP Comments.Barnet Pall, RN,BSN 12/25/2019 2:31 PM

## 2019-12-26 ENCOUNTER — Encounter (HOSPITAL_COMMUNITY)
Admission: RE | Admit: 2019-12-26 | Discharge: 2019-12-26 | Disposition: A | Discharge status: Home / Self Care | Payer: BC Managed Care – PPO | Source: Ambulatory Visit | Attending: Cardiology | Admitting: Cardiology

## 2019-12-26 ENCOUNTER — Other Ambulatory Visit: Payer: Self-pay

## 2019-12-26 DIAGNOSIS — I5022 Chronic systolic (congestive) heart failure: Secondary | ICD-10-CM | POA: Diagnosis not present

## 2019-12-29 ENCOUNTER — Other Ambulatory Visit: Payer: Self-pay

## 2019-12-29 ENCOUNTER — Encounter (HOSPITAL_COMMUNITY)
Admission: RE | Admit: 2019-12-29 | Discharge: 2019-12-29 | Disposition: A | Discharge status: Home / Self Care | Payer: BC Managed Care – PPO | Source: Ambulatory Visit | Attending: Cardiology | Admitting: Cardiology

## 2019-12-29 DIAGNOSIS — I5022 Chronic systolic (congestive) heart failure: Secondary | ICD-10-CM

## 2019-12-31 ENCOUNTER — Encounter (HOSPITAL_COMMUNITY)
Admission: RE | Admit: 2019-12-31 | Discharge: 2019-12-31 | Disposition: A | Discharge status: Home / Self Care | Payer: BC Managed Care – PPO | Source: Ambulatory Visit | Attending: Cardiology | Admitting: Cardiology

## 2019-12-31 ENCOUNTER — Other Ambulatory Visit: Payer: Self-pay

## 2019-12-31 DIAGNOSIS — I5022 Chronic systolic (congestive) heart failure: Secondary | ICD-10-CM | POA: Diagnosis not present

## 2020-01-02 ENCOUNTER — Other Ambulatory Visit: Payer: Self-pay

## 2020-01-02 ENCOUNTER — Encounter (HOSPITAL_COMMUNITY)
Admission: RE | Admit: 2020-01-02 | Discharge: 2020-01-02 | Disposition: A | Discharge status: Home / Self Care | Payer: BC Managed Care – PPO | Source: Ambulatory Visit | Attending: Cardiology | Admitting: Cardiology

## 2020-01-02 DIAGNOSIS — I5022 Chronic systolic (congestive) heart failure: Secondary | ICD-10-CM

## 2020-01-05 ENCOUNTER — Other Ambulatory Visit: Payer: Self-pay

## 2020-01-05 ENCOUNTER — Encounter (HOSPITAL_COMMUNITY)
Admission: RE | Admit: 2020-01-05 | Discharge: 2020-01-05 | Disposition: A | Discharge status: Home / Self Care | Payer: BC Managed Care – PPO | Source: Ambulatory Visit | Attending: Cardiology | Admitting: Cardiology

## 2020-01-05 DIAGNOSIS — I5022 Chronic systolic (congestive) heart failure: Secondary | ICD-10-CM | POA: Diagnosis not present

## 2020-01-07 ENCOUNTER — Encounter (HOSPITAL_COMMUNITY)
Admission: RE | Admit: 2020-01-07 | Discharge: 2020-01-07 | Disposition: A | Discharge status: Home / Self Care | Payer: BC Managed Care – PPO | Source: Ambulatory Visit | Attending: Cardiology | Admitting: Cardiology

## 2020-01-07 ENCOUNTER — Other Ambulatory Visit: Payer: Self-pay

## 2020-01-07 DIAGNOSIS — I5022 Chronic systolic (congestive) heart failure: Secondary | ICD-10-CM | POA: Diagnosis not present

## 2020-01-09 ENCOUNTER — Encounter (HOSPITAL_COMMUNITY): Payer: BC Managed Care – PPO

## 2020-01-12 ENCOUNTER — Other Ambulatory Visit: Payer: Self-pay

## 2020-01-12 ENCOUNTER — Encounter (HOSPITAL_COMMUNITY)
Admission: RE | Admit: 2020-01-12 | Discharge: 2020-01-12 | Disposition: A | Discharge status: Home / Self Care | Payer: BC Managed Care – PPO | Source: Ambulatory Visit | Attending: Cardiology | Admitting: Cardiology

## 2020-01-12 DIAGNOSIS — I5022 Chronic systolic (congestive) heart failure: Secondary | ICD-10-CM

## 2020-01-12 NOTE — Progress Notes (Signed)
Nutrition Note  Spoke with pt today about a low sodium diet and current diet recommendations for CHF. Reviewed the benefits of maintaining a low sodium diet. Showed pt how to read labels utilizing nutrition content claims and nutrition information. Recommended pt eat 1500 mg sodium per day. Discussed the importance of choosing low sodium products such as fresh/frozen fruits and vegetables and limiting processed meats, highly processed foods, and restaurant foods. Reviewed budget friendly food options (ie reading labels to select canned beans, vegetables, frozen meals). Reviewed diet limitations related to colostomy. Reviewed simple cooking/preparing at home techniques for lower sodium meals and snacks. Pt verbalized understanding of material discussed today. Distributed RD contact information.     Michaele Offer, MS, RDN, LDN

## 2020-01-14 ENCOUNTER — Other Ambulatory Visit: Payer: Self-pay

## 2020-01-14 ENCOUNTER — Encounter (HOSPITAL_COMMUNITY)
Admission: RE | Admit: 2020-01-14 | Discharge: 2020-01-14 | Disposition: A | Discharge status: Home / Self Care | Payer: BC Managed Care – PPO | Source: Ambulatory Visit | Attending: Cardiology | Admitting: Cardiology

## 2020-01-14 DIAGNOSIS — I5022 Chronic systolic (congestive) heart failure: Secondary | ICD-10-CM | POA: Diagnosis not present

## 2020-01-16 ENCOUNTER — Encounter (HOSPITAL_COMMUNITY)
Admission: RE | Admit: 2020-01-16 | Discharge: 2020-01-16 | Disposition: A | Discharge status: Home / Self Care | Payer: Medicare Other | Source: Ambulatory Visit | Attending: Cardiology | Admitting: Cardiology

## 2020-01-16 ENCOUNTER — Other Ambulatory Visit: Payer: Self-pay

## 2020-01-16 DIAGNOSIS — I5022 Chronic systolic (congestive) heart failure: Secondary | ICD-10-CM | POA: Diagnosis present

## 2020-01-19 ENCOUNTER — Encounter (HOSPITAL_COMMUNITY)
Admission: RE | Admit: 2020-01-19 | Discharge: 2020-01-19 | Disposition: A | Discharge status: Home / Self Care | Payer: Medicare Other | Source: Ambulatory Visit | Attending: Cardiology | Admitting: Cardiology

## 2020-01-19 ENCOUNTER — Other Ambulatory Visit: Payer: Self-pay

## 2020-01-19 DIAGNOSIS — I5022 Chronic systolic (congestive) heart failure: Secondary | ICD-10-CM | POA: Diagnosis not present

## 2020-01-20 ENCOUNTER — Ambulatory Visit (HOSPITAL_BASED_OUTPATIENT_CLINIC_OR_DEPARTMENT_OTHER)
Admission: RE | Admit: 2020-01-20 | Discharge: 2020-01-20 | Disposition: A | Discharge status: Home / Self Care | Payer: Medicare Other | Source: Ambulatory Visit | Attending: Cardiology | Admitting: Cardiology

## 2020-01-20 ENCOUNTER — Encounter (HOSPITAL_COMMUNITY): Payer: Self-pay | Admitting: Cardiology

## 2020-01-20 ENCOUNTER — Ambulatory Visit (HOSPITAL_COMMUNITY)
Admission: RE | Admit: 2020-01-20 | Discharge: 2020-01-20 | Disposition: A | Discharge status: Home / Self Care | Payer: Medicare Other | Source: Ambulatory Visit | Attending: Cardiology | Admitting: Cardiology

## 2020-01-20 VITALS — BP 134/80 | HR 68 | Wt 127.4 lb

## 2020-01-20 DIAGNOSIS — G35 Multiple sclerosis: Secondary | ICD-10-CM | POA: Diagnosis not present

## 2020-01-20 DIAGNOSIS — Z7901 Long term (current) use of anticoagulants: Secondary | ICD-10-CM | POA: Diagnosis not present

## 2020-01-20 DIAGNOSIS — I5022 Chronic systolic (congestive) heart failure: Secondary | ICD-10-CM

## 2020-01-20 DIAGNOSIS — N184 Chronic kidney disease, stage 4 (severe): Secondary | ICD-10-CM | POA: Insufficient documentation

## 2020-01-20 DIAGNOSIS — I082 Rheumatic disorders of both aortic and tricuspid valves: Secondary | ICD-10-CM | POA: Insufficient documentation

## 2020-01-20 DIAGNOSIS — Z905 Acquired absence of kidney: Secondary | ICD-10-CM | POA: Insufficient documentation

## 2020-01-20 DIAGNOSIS — I130 Hypertensive heart and chronic kidney disease with heart failure and stage 1 through stage 4 chronic kidney disease, or unspecified chronic kidney disease: Secondary | ICD-10-CM | POA: Insufficient documentation

## 2020-01-20 DIAGNOSIS — I447 Left bundle-branch block, unspecified: Secondary | ICD-10-CM | POA: Insufficient documentation

## 2020-01-20 DIAGNOSIS — I428 Other cardiomyopathies: Secondary | ICD-10-CM | POA: Insufficient documentation

## 2020-01-20 DIAGNOSIS — Z79899 Other long term (current) drug therapy: Secondary | ICD-10-CM | POA: Diagnosis not present

## 2020-01-20 LAB — BASIC METABOLIC PANEL
Anion gap: 10 (ref 5–15)
BUN: 56 mg/dL — ABNORMAL HIGH (ref 8–23)
CO2: 16 mmol/L — ABNORMAL LOW (ref 22–32)
Calcium: 9 mg/dL (ref 8.9–10.3)
Chloride: 114 mmol/L — ABNORMAL HIGH (ref 98–111)
Creatinine, Ser: 2.51 mg/dL — ABNORMAL HIGH (ref 0.44–1.00)
GFR calc Af Amer: 22 mL/min — ABNORMAL LOW (ref >60)
GFR calc non Af Amer: 19 mL/min — ABNORMAL LOW (ref >60)
Glucose, Bld: 81 mg/dL (ref 70–99)
Potassium: 4.2 mmol/L (ref 3.5–5.1)
Sodium: 140 mmol/L (ref 135–145)

## 2020-01-20 MED ORDER — CARVEDILOL 6.25 MG PO TABS: 6.2500 mg | ORAL_TABLET | Freq: Two times a day (BID) | ORAL | 3 refills | Status: DC

## 2020-01-20 NOTE — Patient Instructions (Addendum)
Restart Coreg 6.25mg  (1 tab) twice a day   Labs today We will only contact you if something comes back abnormal or we need to make some changes. Otherwise no news is good news!   You have been referred to Electrophysiology.  They will call you to schedule an appointment.    Your physician recommends that you schedule a follow-up appointment in: 2-3 weeks with the Pharmacist for med titration and 3 months with Dr Aundra Dubin.    Please call office at [phone] option [phone] if you have any questions or concerns.    At the [facility], you and your health needs are our priority. As part of our continuing mission to provide you with exceptional heart care, we have created designated Provider Care Teams. These Care Teams include your primary Cardiologist (physician) and Advanced Practice Providers (APPs- Physician Assistants and Nurse Practitioners) who all work together to provide you with the care you need, when you need it.   You may see any of the following providers on your designated Care Team at your next follow up: Marland Kitchen Dr Glori Bickers . Dr Loralie Champagne . Darrick Grinder, NP . Lyda Jester, PA . Audry Riles, PharmD   Please be sure to bring in all your medications bottles to every appointment.

## 2020-01-20 NOTE — Progress Notes (Signed)
  Echocardiogram 2D Echocardiogram has been performed.  Jennette Dubin 01/20/2020, 2:53 PM

## 2020-01-21 ENCOUNTER — Other Ambulatory Visit: Payer: Self-pay

## 2020-01-21 ENCOUNTER — Encounter (HOSPITAL_COMMUNITY)
Admission: RE | Admit: 2020-01-21 | Discharge: 2020-01-21 | Disposition: A | Discharge status: Home / Self Care | Payer: Medicare Other | Source: Ambulatory Visit | Attending: Cardiology | Admitting: Cardiology

## 2020-01-21 DIAGNOSIS — I5022 Chronic systolic (congestive) heart failure: Secondary | ICD-10-CM | POA: Diagnosis not present

## 2020-01-21 NOTE — Progress Notes (Signed)
IDTerissa Bruce, DOB 1955-11-10, MRN [id number]   Provider location: [facility] Type of Visit: Established patient   PCP:  Derrill Center., MD  Cardiologist:  Fransico Him, MD HF Cardiolgoy: Dr Aundra Dubin   History of Present Illness: Jeanne Bruce is a 64 y.o. female who has a history of CKD stage 4, chronic systolic CHF, multiple sclerosis, and right nephrectomy.  She was referred by Dr. Radford Pax for CHF evaluation.  Patient also had remote colon cancer with colostomy.  She has had frequent UTIs and was admitted in 10/20 with urosepsis.  She had respiratory arrest and VF arrest in the ER and was intubated.  Echo was done, showing EF 25% with apical ballooning.  LHC showed no significant coronary disease.  She ended up having right nephrectomy for renal abscess in 10/20.    Echo was repeated in 12/20, showing EF still low at 25-30% with peri-apical akinesis.   She has had a hard time taking cardiac medications due to orthostatic symptoms with low BP.  She was sent home from the hospital in 10/20 on hydralazine/Imdur but had to stop due to low BP.    Echo was done today and reviewed, EF 25% with septal-lateral dyssynchrony, normal RV.    She returns for followup of CHF.  She is currently only taking spironolactone.  She misunderstood instructions at last appt and stopped Coreg. She denies lightheadedness on spironolactone, and had been doing ok on Coreg alone prior to that.  She has been going to cardiac rehab, and she feels stronger overall.  No dyspnea walking on flat ground or walking up a flight of stairs. No chest pain.  No lightheadedness.   Labs (11/20): creatinine 1.5 Labs (12/20): creatinine 2.47 Labs (1/21): creatinine 2.1 => 2.04, BNP 54  ECG (personally reviewed): NSR, LBBB 154 msec  PMH: 1. H/o right nephrectomy for renal abscess in 10/20.  2. Multiple Sclerosis 3. HTN 4. H/o colon cancer: s/p colectomy with colostomy.  5. CKD: Stage 4. Sees a nephrologist at  [facility].  6. Frequent UTIs.  7. Chronic systolic CHF: Nonischemic cardiomyopathy.  - Echo (10/20): EF 25%, apical ballooning.  - LHC (10/20): Normal coronaries.  - Echo (12/20): EF 25-30%, peri-apical akinesis, normal RV size and systolic function (similar to 10/20 echo).   - Echo (4/21): EF 25%, septal-lateral dyssynchrony 8. LBBB  Social History   Socioeconomic History  . Marital status: Married    Spouse name: Not on file  . Number of children: Not on file  . Years of education: Not on file  . Highest education level: Not on file  Occupational History  . Not on file  Tobacco Use  . Smoking status: Never Smoker  . Smokeless tobacco: Never Used  Substance and Sexual Activity  . Alcohol use: Not Currently  . Drug use: Never  . Sexual activity: Not on file  Other Topics Concern  . Not on file  Social History Narrative  . Not on file   Social Determinants of Health   Financial Resource Strain:   . Difficulty of Paying Living Expenses:   Food Insecurity:   . Worried About Charity fundraiser in the Last Year:   . Arboriculturist in the Last Year:   Transportation Needs: No Transportation Needs  . Lack of Transportation (Medical): No  . Lack of Transportation (Non-Medical): No  Physical Activity: Insufficiently Active  . Days of Exercise per Week: 2 days  . Minutes of  Exercise per Session: 10 min  Stress:   . Feeling of Stress :   Social Connections: Unknown  . Frequency of Communication with Friends and Family: More than three times a week  . Frequency of Social Gatherings with Friends and Family: Twice a week  . Attends Religious Services: More than 4 times per year  . Active Member of Clubs or Organizations: Not on file  . Attends Archivist Meetings: Not on file  . Marital Status: Married  Human resources officer Violence:   . Fear of Current or Ex-Partner:   . Emotionally Abused:   Marland Kitchen Physically Abused:   . Sexually Abused:    Family History  Problem  Relation Age of Onset  . Diabetes Mellitus II Neg Hx    ROS: All systems reviewed and negative except as per HPI.   Current Outpatient Medications  Medication Sig Dispense Refill  . cholecalciferol (VITAMIN D3) 25 MCG (1000 UT) tablet Take 2,000 Units by mouth daily.    Marland Kitchen conjugated estrogens (PREMARIN) vaginal cream Place 1 Applicatorful vaginally at bedtime.    . Cyanocobalamin (VITAMIN B12 PO) Take by mouth daily.    Marland Kitchen escitalopram (LEXAPRO) 10 MG tablet Take 10 mg by mouth daily.    . magnesium oxide (MAG-OX) 400 MG tablet Take 400 mg by mouth daily.    Marland Kitchen omeprazole (PRILOSEC) 20 MG capsule Take 20 mg by mouth 2 (two) times daily.    . ondansetron (ZOFRAN) 4 MG tablet Take 4 mg by mouth every 8 (eight) hours as needed for nausea or vomiting.    . Probiotic Product (PROBIOTIC DAILY PO) Take 1 capsule by mouth daily.    . solifenacin (VESICARE) 5 MG tablet Take by mouth.    . spironolactone (ALDACTONE) 25 MG tablet Take 0.5 tablets (12.5 mg total) by mouth daily. 45 tablet 3  . UNABLE TO FIND Core Power nutritional supplement.    . carvedilol (COREG) 6.25 MG tablet Take 1 tablet (6.25 mg total) by mouth 2 (two) times daily. 60 tablet 3   No current facility-administered medications for this encounter.   Exam:   BP 134/80   Pulse 68   Wt 57.8 kg (127 lb 6.4 oz)   SpO2 100%   BMI 24.07 kg/m  General: NAD Neck: No JVD, no thyromegaly or thyroid nodule.  Lungs: Clear to auscultation bilaterally with normal respiratory effort. CV: Nondisplaced PMI.  Heart regular S1/S2, no S3/S4, no murmur.  No peripheral edema.  No carotid bruit.  Normal pedal pulses.  Abdomen: Soft, nontender, no hepatosplenomegaly, no distention.  Skin: Intact without lesions or rashes.  Neurologic: Alert and oriented x 3.  Psych: Normal affect. Extremities: No clubbing or cyanosis.  HEENT: Normal.   Assessment/Plan:  1. Chronic systolic CHF: Nonischemic cardiomyopathy, LHC in 10/20 with no significant  coronary disease.  Echo in 10/20 was suggestive of stress (Takotsubo-type) cardiomyopathy with apical ballooning (from severe urosepsis).  However, LV function did not improve on repeat echo in 12/20 or echo done today (EF still 25%).  ?LBBB cardiomyopathy.  NYHA class II symptoms.  She has had a hard time tolerating cardiac meds due to orthostasis, but this has been improved recently.   - Continue spironolactone 12.5 mg daily.  BMET today.  - Restart Coreg 6.25 mg bid.  - No ACEI/ARNI/ARB for now with CKD stage 4.  - Continue cardiac rehab.  - EF has not recovered by echo done today, will need to consider CRT-D device (she has chronic LBBB,  QRS 154 msec). I will refer her for EP evaluation.  2. CKD stage 4: S/p right nephrectomy in 10/20.  Sees nephrology at [facility].  - BMET today.  3. Chronic LBBB: See above regarding EP referral for CRT-D.   Recommended follow-up:  2-3 weeks with HF pharmacist for med titration, see me in 3 months.   Signed, Loralie Champagne, MD  01/21/2020  [facility] [address] [facility] [place] [address] [phone] (office) [phone] (fax)

## 2020-01-22 NOTE — Progress Notes (Signed)
Cardiac Individual Treatment Plan  Patient Details  Name: Jeanne Bruce MRN: [id number] Date of Birth: 1956/07/28 Referring Provider:     CARDIAC REHAB PHASE II ORIENTATION from 11/25/2019 in [facility]  Referring Provider  Loralie Champagne, MD      Initial Encounter Date:    CARDIAC REHAB PHASE II ORIENTATION from 11/25/2019 in [facility]  Date  11/25/19      Visit Diagnosis: Chronic systolic CHF (congestive heart failure) ([place])  Patient's Home Medications on Admission:  Current Outpatient Medications:  .  carvedilol (COREG) 6.25 MG tablet, Take 1 tablet (6.25 mg total) by mouth 2 (two) times daily., Disp: 60 tablet, Rfl: 3 .  cholecalciferol (VITAMIN D3) 25 MCG (1000 UT) tablet, Take 2,000 Units by mouth daily., Disp: , Rfl:  .  conjugated estrogens (PREMARIN) vaginal cream, Place 1 Applicatorful vaginally at bedtime., Disp: , Rfl:  .  Cyanocobalamin (VITAMIN B12 PO), Take by mouth daily., Disp: , Rfl:  .  escitalopram (LEXAPRO) 10 MG tablet, Take 10 mg by mouth daily., Disp: , Rfl:  .  magnesium oxide (MAG-OX) 400 MG tablet, Take 400 mg by mouth daily., Disp: , Rfl:  .  omeprazole (PRILOSEC) 20 MG capsule, Take 20 mg by mouth 2 (two) times daily., Disp: , Rfl:  .  ondansetron (ZOFRAN) 4 MG tablet, Take 4 mg by mouth every 8 (eight) hours as needed for nausea or vomiting., Disp: , Rfl:  .  Probiotic Product (PROBIOTIC DAILY PO), Take 1 capsule by mouth daily., Disp: , Rfl:  .  solifenacin (VESICARE) 5 MG tablet, Take by mouth., Disp: , Rfl:  .  spironolactone (ALDACTONE) 25 MG tablet, Take 0.5 tablets (12.5 mg total) by mouth daily., Disp: 45 tablet, Rfl: 3 .  UNABLE TO FIND, [employer] Power nutritional supplement., Disp: , Rfl:   Past Medical History: Past Medical History:  Diagnosis Date  . Cancer ([place])    colon (resolved)  . Hypertension   . Multiple sclerosis ([place])   . Renal disorder     Tobacco Use: Social History    Tobacco Use  Smoking Status Never Smoker  Smokeless Tobacco Never Used    Labs: Recent Review Flowsheet Data    Labs for ITP Cardiac and Pulmonary Rehab Latest Ref Rng & Units 08/11/2019 08/11/2019 08/11/2019 08/11/2019 08/11/2019   Cholestrol 0 - 200 mg/dL - 160 - - -   LDLCALC 0 - 99 mg/dL - 92 - - -   HDL >40 mg/dL - 35(L) - - -   Trlycerides <150 mg/dL - 165(H) - - -   Hemoglobin A1c 4.8 - 5.6 % 4.5(L) - - - 4.6(L)   PHART 7.350 - 7.450 - - 7.616(HH) 7.488(H) -   PCO2ART 32.0 - 48.0 mmHg - - 26.0(L) 34.0 -   HCO3 20.0 - 28.0 mmol/L - - 26.5 25.7 -   TCO2 22 - 32 mmol/L - - 27 27 -   O2SAT % - - 100.0 100.0 -      Capillary Blood Glucose: Lab Results  Component Value Date   GLUCAP 88 08/14/2019   GLUCAP 104 (H) 08/14/2019   GLUCAP 105 (H) 08/14/2019   GLUCAP 124 (H) 08/13/2019   GLUCAP 135 (H) 08/13/2019     Exercise Target Goals: Exercise Program Goal: Individual exercise prescription set using results from initial 6 min walk test and THRR while considering  patient's activity barriers and safety.   Exercise Prescription Goal: Starting with aerobic activity  30 plus minutes a day, 3 days per week for initial exercise prescription. Provide home exercise prescription and guidelines that participant acknowledges understanding prior to discharge.  Activity Barriers & Risk Stratification: Activity Barriers & Cardiac Risk Stratification - 11/25/19 1145      Activity Barriers & Cardiac Risk Stratification   Activity Barriers  Other (comment);Assistive Device    Comments  Multiple sclerosis- right side weaker than left.    Cardiac Risk Stratification  High       6 Minute Walk: 6 Minute Walk    Row Name 11/25/19 1157         6 Minute Walk   Phase  Initial     Distance  1243 feet     Walk Time  6 minutes     # of Rest Breaks  0     MPH  2.35     METS  3.08     RPE  11     Perceived Dyspnea   1     VO2 Peak  10.79     Symptoms  Yes (comment)     Comments   Mild SOB     Resting HR  64 bpm     Resting BP  156/68     Resting Oxygen Saturation   99 %     Exercise Oxygen Saturation  during 6 min walk  97 %     Max Ex. HR  94 bpm     Max Ex. BP  134/78     2 Minute Post BP  122/78        Oxygen Initial Assessment:   Oxygen Re-Evaluation:   Oxygen Discharge (Final Oxygen Re-Evaluation):   Initial Exercise Prescription: Initial Exercise Prescription - 11/25/19 1300      Date of Initial Exercise RX and Referring Provider   Date  11/25/19    Referring Provider  Loralie Champagne, MD    Expected Discharge Date  01/30/20      Recumbant Bike   Level  2    Watts  15    Minutes  15    METs  2.83      NuStep   Level  2    SPM  85    Minutes  15    METs  2      Prescription Details   Frequency (times per week)  3    Duration  Progress to 30 minutes of continuous aerobic without signs/symptoms of physical distress      Intensity   THRR 40-80% of Max Heartrate  62-124    Ratings of Perceived Exertion  11-13    Perceived Dyspnea  0-4      Progression   Progression  Continue to progress workloads to maintain intensity without signs/symptoms of physical distress.      Resistance Training   Training Prescription  Yes    Weight  2 lbs.    Reps  10-15       Perform Capillary Blood Glucose checks as needed.  Exercise Prescription Changes:  Exercise Prescription Changes    Row Name 12/10/19 0815 12/22/19 1337 01/02/20 1614 01/16/20 1612       Response to Exercise   Blood Pressure (Admit)  130/74  120/64  102/58  118/80    Blood Pressure (Exercise)  148/80  108/80  124/70  126/60    Blood Pressure (Exit)  130/62  112/60  100/64  108/78    Heart Rate (Admit)  81 bpm  88 bpm  88 bpm  77 bpm    Heart Rate (Exercise)  97 bpm  112 bpm  108 bpm  111 bpm    Heart Rate (Exit)  83 bpm  93 bpm  93 bpm  86 bpm    Rating of Perceived Exertion (Exercise)  12  12  11  12     Perceived Dyspnea (Exercise)  0  0  0  0    Symptoms  None  None   None  None    Comments  Pt's first day of exercise  None  None  None    Duration  Progress to 10 minutes continuous walking  at current work load and total walking time to 30-45 min  Progress to 30 minutes of  aerobic without signs/symptoms of physical distress  Progress to 30 minutes of  aerobic without signs/symptoms of physical distress  Progress to 30 minutes of  aerobic without signs/symptoms of physical distress    Intensity  THRR unchanged  THRR unchanged  THRR unchanged  THRR unchanged      Progression   Progression  Continue to progress workloads to maintain intensity without signs/symptoms of physical distress.  Continue to progress workloads to maintain intensity without signs/symptoms of physical distress.  Continue to progress workloads to maintain intensity without signs/symptoms of physical distress.  Continue to progress workloads to maintain intensity without signs/symptoms of physical distress.    Average METs  1.9  2  2.05  2.3      Resistance Training   Training Prescription  Yes  Yes  Yes  Yes    Weight  2 lbs.  2 lbs.  2 lbs.  2 lbs.    Reps  10-15  10-15  10-15  10-15    Time  10 Minutes  10 Minutes  10 Minutes  10 Minutes      Recumbant Bike   Level  2  2  2  2     Watts  10  10  10  10     Minutes  15  15  15  15     METs  2.1  2.3  2.2  2.4      NuStep   Level  2  2  2  2     SPM  70  70  85  70    Minutes  15  15  15  15     METs  1.7  1.7  1.9  2.1      Home Exercise Plan   Plans to continue exercise at  --  --  --  Home (comment) Walking & Ellipitical    Frequency  --  --  --  Add 2 additional days to program exercise sessions.    Initial Home Exercises Provided  --  --  --  01/21/20       Exercise Comments:  Exercise Comments    Row Name 12/10/19 0836 12/25/19 1338 01/22/20 1615       Exercise Comments  Pt's first day of exercise. Pt responded well to exercise prescription. Will continue to increae workloads as tolerated.  Pt is continuing to respond  well to exercise prescription. Will follow up with pt regarding home exercise plan.  Pt is tolerating exericse well. Pt is progressing very slowly. Working with pt to try and increase workloads and average METs. Reveiwed HEP with pt.        Exercise Goals and Review:  Exercise Goals    Row Name  11/25/19 1148             Exercise Goals   Increase Physical Activity  Yes       Intervention  Provide advice, education, support and counseling about physical activity/exercise needs.;Develop an individualized exercise prescription for aerobic and resistive training based on initial evaluation findings, risk stratification, comorbidities and participant's personal goals.       Expected Outcomes  Short Term: Attend rehab on a regular basis to increase amount of physical activity.;Long Term: Exercising regularly at least 3-5 days a week.;Long Term: Add in home exercise to make exercise part of routine and to increase amount of physical activity.       Increase Strength and Stamina  Yes       Intervention  Provide advice, education, support and counseling about physical activity/exercise needs.;Develop an individualized exercise prescription for aerobic and resistive training based on initial evaluation findings, risk stratification, comorbidities and participant's personal goals.       Expected Outcomes  Short Term: Increase workloads from initial exercise prescription for resistance, speed, and METs.;Short Term: Perform resistance training exercises routinely during rehab and add in resistance training at home;Long Term: Improve cardiorespiratory fitness, muscular endurance and strength as measured by increased METs and functional capacity (6MWT)       Able to understand and use rate of perceived exertion (RPE) scale  Yes       Intervention  Provide education and explanation on how to use RPE scale       Expected Outcomes  Short Term: Able to use RPE daily in rehab to express subjective intensity  level;Long Term:  Able to use RPE to guide intensity level when exercising independently       Knowledge and understanding of Target Heart Rate Range (THRR)  Yes       Intervention  Provide education and explanation of THRR including how the numbers were predicted and where they are located for reference       Expected Outcomes  Short Term: Able to state/look up THRR;Long Term: Able to use THRR to govern intensity when exercising independently;Short Term: Able to use daily as guideline for intensity in rehab       Able to check pulse independently  Yes       Intervention  Provide education and demonstration on how to check pulse in carotid and radial arteries.;Review the importance of being able to check your own pulse for safety during independent exercise       Expected Outcomes  Long Term: Able to check pulse independently and accurately;Short Term: Able to explain why pulse checking is important during independent exercise       Understanding of Exercise Prescription  Yes       Intervention  Provide education, explanation, and written materials on patient's individual exercise prescription       Expected Outcomes  Short Term: Able to explain program exercise prescription;Long Term: Able to explain home exercise prescription to exercise independently          Exercise Goals Re-Evaluation : Exercise Goals Re-Evaluation    Row Name 01/22/20 1616             Exercise Goal Re-Evaluation   Exercise Goals Review  Increase Physical Activity;Increase Strength and Stamina;Able to understand and use rate of perceived exertion (RPE) scale;Knowledge and understanding of Target Heart Rate Range (THRR);Able to check pulse independently;Understanding of Exercise Prescription       Comments  Reviewed HEP with pt. Also reviewed  THRR, weather conditions, endpoints of exercise, RPE scale, warmup and cool down. Pt puts forth minimal effort with exericse and is not progressing. Will continue to work with pt on  functional capacity.       Expected Outcomes  Pt will continue to walk 2 days a week for 15-20 minutes. Encouraged pt to try and progress to 30 minutes. Will continue to monitor.           Discharge Exercise Prescription (Final Exercise Prescription Changes): Exercise Prescription Changes - 01/16/20 1612      Response to Exercise   Blood Pressure (Admit)  118/80    Blood Pressure (Exercise)  126/60    Blood Pressure (Exit)  108/78    Heart Rate (Admit)  77 bpm    Heart Rate (Exercise)  111 bpm    Heart Rate (Exit)  86 bpm    Rating of Perceived Exertion (Exercise)  12    Perceived Dyspnea (Exercise)  0    Symptoms  None    Comments  None    Duration  Progress to 30 minutes of  aerobic without signs/symptoms of physical distress    Intensity  THRR unchanged      Progression   Progression  Continue to progress workloads to maintain intensity without signs/symptoms of physical distress.    Average METs  2.3      Resistance Training   Training Prescription  Yes    Weight  2 lbs.    Reps  10-15    Time  10 Minutes      Recumbant Bike   Level  2    Watts  10    Minutes  15    METs  2.4      NuStep   Level  2    SPM  70    Minutes  15    METs  2.1      Home Exercise Plan   Plans to continue exercise at  Home (comment)   Walking & Ellipitical   Frequency  Add 2 additional days to program exercise sessions.    Initial Home Exercises Provided  01/21/20       Nutrition:  Target Goals: Understanding of nutrition guidelines, daily intake of sodium 1500mg , cholesterol 200mg , calories 30% from fat and 7% or less from saturated fats, daily to have 5 or more servings of fruits and vegetables.  Biometrics: Pre Biometrics - 11/25/19 1206      Pre Biometrics   Waist Circumference  31 inches    Hip Circumference  38 inches    Waist to Hip Ratio  0.82 %    Triceps Skinfold  26 mm    % Body Fat  35.5 %    Grip Strength  21 kg    Flexibility  11.25 in    Single Leg Stand   8.5 seconds        Nutrition Therapy Plan and Nutrition Goals: Nutrition Therapy & Goals - 12/22/19 1506      Nutrition Therapy   Diet  Low Sodium      Personal Nutrition Goals   Nutrition Goal  Pt to build a healthy plate including vegetables, fruits, whole grains, and low-fat dairy products in a heart healthy meal plan.    Personal Goal #2  Pt to learn to read labels and make appropriate food choices for heart health and CKD      Intervention Plan   Intervention  Prescribe, educate and counsel regarding individualized specific dietary  modifications aiming towards targeted core components such as weight, hypertension, lipid management, diabetes, heart failure and other comorbidities.    Expected Outcomes  Short Term Goal: Understand basic principles of dietary content, such as calories, fat, sodium, cholesterol and nutrients.       Nutrition Assessments: Nutrition Assessments - 12/22/19 1507      MEDFICTS Scores   Pre Score  33       Nutrition Goals Re-Evaluation: Nutrition Goals Re-Evaluation    Atkins Name 12/22/19 1506 01/20/20 1451           Goals   Current Weight  127 lb (57.6 kg)  127 lb (57.6 kg)      Nutrition Goal  --  Pt to build a healthy plate including vegetables, fruits, whole grains, and low-fat dairy products in a heart healthy meal plan.      Expected Outcome  Ability to read labels and make heart healthy and kidney friendly food choices  Ability to read labels and make heart healthy and kidney friendly food choices        Personal Goal #2 Re-Evaluation   Personal Goal #2  --  Pt to learn to read labels and make appropriate food choices for heart health and CKD         Nutrition Goals Discharge (Final Nutrition Goals Re-Evaluation): Nutrition Goals Re-Evaluation - 01/20/20 1451      Goals   Current Weight  127 lb (57.6 kg)    Nutrition Goal  Pt to build a healthy plate including vegetables, fruits, whole grains, and low-fat dairy products in a heart  healthy meal plan.    Expected Outcome  Ability to read labels and make heart healthy and kidney friendly food choices      Personal Goal #2 Re-Evaluation   Personal Goal #2  Pt to learn to read labels and make appropriate food choices for heart health and CKD       Psychosocial: Target Goals: Acknowledge presence or absence of significant depression and/or stress, maximize coping skills, provide positive support system. Participant is able to verbalize types and ability to use techniques and skills needed for reducing stress and depression.  Initial Review & Psychosocial Screening: Initial Psych Review & Screening - 11/25/19 1329      Family Dynamics   Comments  Patient has a positive attitude and outlook. She has a very strong support system including family, friends, and healthcare providers. She denies any psychosocial barriers to self health management or participation in CR.       Quality of Life Scores: Quality of Life - 11/25/19 1328      Quality of Life   Select  Quality of Life      Quality of Life Scores   Health/Function Pre  19.37 %    Socioeconomic Pre  29.58 %    Psych/Spiritual Pre  27.36 %    Family Pre  25.2 %    GLOBAL Pre  23.8 %      Scores of 19 and below usually indicate a poorer quality of life in these areas.  A difference of  2-3 points is a clinically meaningful difference.  A difference of 2-3 points in the total score of the Quality of Life Index has been associated with significant improvement in overall quality of life, self-image, physical symptoms, and general health in studies assessing change in quality of life.  PHQ-9: Recent Review Flowsheet Data    Depression screen [facility] 2/9 11/25/2019   Decreased Interest  0   Down, Depressed, Hopeless 0   PHQ - 2 Score 0     Interpretation of Total Score  Total Score Depression Severity:  1-4 = Minimal depression, 5-9 = Mild depression, 10-14 = Moderate depression, 15-19 = Moderately severe depression,  20-27 = Severe depression   Psychosocial Evaluation and Intervention: Psychosocial Evaluation - 12/11/19 0725      Psychosocial Evaluation & Interventions   Interventions  Encouraged to exercise with the program and follow exercise prescription    Comments  Ms. Minnich continues to have a positive attitude and outlook. She has a very strong support system of family and friends. She enjoys spending time with her grandchildren and going to the gym. She continues to deny psychosocial barriers to participation in CR. No interventions needed at this time.    Expected Outcomes  Ms. Haney will contine to have a positive attitude and outlook. She will utilize her support system and hobbies to manage any barriers to participation in CR that may arise.    Continue Psychosocial Services   No Follow up required       Psychosocial Re-Evaluation: Psychosocial Re-Evaluation    [place] Name 12/23/19 1255 01/22/20 1427           Psychosocial Re-Evaluation   Current issues with  None Identified  None Identified      Comments  Ms. Plaugher continues to deny psychosocial barriers to participation in CR. She continues to have a positive attitude and outlook. She says as her health improves so does her desire to continue to exercise and make lifestyle changes. She has a strong support system including family and a caregiver. She loves spending time with her grandchild. No interventions needed at this time.  Ms. Peraza continues to deny psychosocial barriers to participation in CR. She continues to have a positive attitude and outlook. She says as her health improves so does her desire to continue to exercise and make lifestyle changes. She has a strong support system including family and a caregiver. She loves spending time with her grandchild. No interventions needed at this time.      Expected Outcomes  Ms. Hanks will continue to deny psychosocial barriers to self health management and participation in cardiac rehab. She will  continue maintain a positive attitude and outlook.  Ms. Outten will continue to deny psychosocial barriers to self health management and participation in cardiac rehab. She will continue maintain a positive attitude and outlook.      Interventions  Encouraged to attend Cardiac Rehabilitation for the exercise  Encouraged to attend Cardiac Rehabilitation for the exercise      Continue Psychosocial Services   No Follow up required  No Follow up required         Psychosocial Discharge (Final Psychosocial Re-Evaluation): Psychosocial Re-Evaluation - 01/22/20 1427      Psychosocial Re-Evaluation   Current issues with  None Identified    Comments  Ms. Delgadillo continues to deny psychosocial barriers to participation in CR. She continues to have a positive attitude and outlook. She says as her health improves so does her desire to continue to exercise and make lifestyle changes. She has a strong support system including family and a caregiver. She loves spending time with her grandchild. No interventions needed at this time.    Expected Outcomes  Ms. Opiela will continue to deny psychosocial barriers to self health management and participation in cardiac rehab. She will continue maintain a positive attitude and outlook.  Interventions  Encouraged to attend Cardiac Rehabilitation for the exercise    Continue Psychosocial Services   No Follow up required       Vocational Rehabilitation: Provide vocational rehab assistance to qualifying candidates.   Vocational Rehab Evaluation & Intervention: Vocational Rehab - 11/25/19 1229      Initial Vocational Rehab Evaluation & Intervention   Assessment shows need for Vocational Rehabilitation  No       Education: Education Goals: Education classes will be provided on a weekly basis, covering required topics. Participant will state understanding/return demonstration of topics presented.  Learning Barriers/Preferences:   Education Topics: Hypertension,  Hypertension Reduction -Define heart disease and high blood pressure. Discus how high blood pressure affects the body and ways to reduce high blood pressure.   Exercise and Your Heart -Discuss why it is important to exercise, the FITT principles of exercise, normal and abnormal responses to exercise, and how to exercise safely.   Angina -Discuss definition of angina, causes of angina, treatment of angina, and how to decrease risk of having angina.   Cardiac Medications -Review what the following cardiac medications are used for, how they affect the body, and side effects that may occur when taking the medications.  Medications include Aspirin, Beta blockers, calcium channel blockers, ACE Inhibitors, angiotensin receptor blockers, diuretics, digoxin, and antihyperlipidemics.   Congestive Heart Failure -Discuss the definition of CHF, how to live with CHF, the signs and symptoms of CHF, and how keep track of weight and sodium intake.   Heart Disease and Intimacy -Discus the effect sexual activity has on the heart, how changes occur during intimacy as we age, and safety during sexual activity.   Smoking Cessation / COPD -Discuss different methods to quit smoking, the health benefits of quitting smoking, and the definition of COPD.   Nutrition I: Fats -Discuss the types of cholesterol, what cholesterol does to the heart, and how cholesterol levels can be controlled.   Nutrition II: Labels -Discuss the different components of food labels and how to read food label   Heart Parts/Heart Disease and PAD -Discuss the anatomy of the heart, the pathway of blood circulation through the heart, and these are affected by heart disease.   Stress I: Signs and Symptoms -Discuss the causes of stress, how stress may lead to anxiety and depression, and ways to limit stress.   Stress II: Relaxation -Discuss different types of relaxation techniques to limit stress.   Warning Signs of Stroke /  TIA -Discuss definition of a stroke, what the signs and symptoms are of a stroke, and how to identify when someone is having stroke.   Knowledge Questionnaire Score: Knowledge Questionnaire Score - 11/25/19 1329      Knowledge Questionnaire Score   Pre Score  20/24       Core Components/Risk Factors/Patient Goals at Admission: Personal Goals and Risk Factors at Admission - 11/25/19 1331      Core Components/Risk Factors/Patient Goals on Admission   Heart Failure  Yes    Intervention  Provide a combined exercise and nutrition program that is supplemented with education, support and counseling about heart failure. Directed toward relieving symptoms such as shortness of breath, decreased exercise tolerance, and extremity edema.    Expected Outcomes  Improve functional capacity of life;Short term: Attendance in program 2-3 days a week with increased exercise capacity. Reported lower sodium intake. Reported increased fruit and vegetable intake. Reports medication compliance.;Short term: Daily weights obtained and reported for increase. Utilizing diuretic protocols set by  physician.;Long term: Adoption of self-care skills and reduction of barriers for early signs and symptoms recognition and intervention leading to self-care maintenance.    Hypertension  Yes    Intervention  Provide education on lifestyle modifcations including regular physical activity/exercise, weight management, moderate sodium restriction and increased consumption of fresh fruit, vegetables, and low fat dairy, alcohol moderation, and smoking cessation.;Monitor prescription use compliance.    Expected Outcomes  Short Term: Continued assessment and intervention until BP is < 140/84mm HG in hypertensive participants. < 130/56mm HG in hypertensive participants with diabetes, heart failure or chronic kidney disease.;Long Term: Maintenance of blood pressure at goal levels.       Core Components/Risk Factors/Patient Goals Review:   Goals and Risk Factor Review    Row Name 12/10/19 1227 12/23/19 1300 01/22/20 1428         Core Components/Risk Factors/Patient Goals Review   Personal Goals Review  Heart Failure;Hypertension  Heart Failure;Hypertension  Heart Failure;Hypertension     Review  Ms. Sickinger has several CAD risk factors. She is eager to participate in CR to reduce her risk factors and improve her quality of life.  Ms. Rochefort has several CAD risk factors. She is taking her medications as prescribed and her BP is now controlled with the increase dosage of coreg. She weighs daily and follows the HF action plan. She is beginning to exercise at home by walking.  Ms. Schewe has several CAD risk factors. She is taking her medications as prescribed. Vital signs have been stable. She weighs daily and follows the HF action plan. She is beginning to exercise at home by walking.     Expected Outcomes  Ms Sliwinski will continue to participate in CR exercise sessions.  Ms Puryear will continue to participate in CR exercise sessions.  Ms Burdett will continue to participate in CR exercise sessions for risk factor modification        Core Components/Risk Factors/Patient Goals at Discharge (Final Review):  Goals and Risk Factor Review - 01/22/20 1428      Core Components/Risk Factors/Patient Goals Review   Personal Goals Review  Heart Failure;Hypertension    Review  Ms. Westcott has several CAD risk factors. She is taking her medications as prescribed. Vital signs have been stable. She weighs daily and follows the HF action plan. She is beginning to exercise at home by walking.    Expected Outcomes  Ms Stille will continue to participate in CR exercise sessions for risk factor modification       ITP Comments: ITP Comments    Row Name 11/25/19 1206 12/10/19 1223 12/23/19 1237 01/22/20 1426     ITP Comments  Dr. Fransico Him, Medical director [facility] cardiac rehab  Ms. Matlack completed her first cardiac rehab exercise session today and tolerated very well.  VSS. Patient denied complaints.  30 day ITP review: Ms. Croswell is doing very well in CR. She is working to an RPE of 11-13 and feels her stamina and strength are returning. Her BP is much improved with increasing her coreg to 6.25. All vital remain stable. Ms. Belknap denies cardiac complaints or concerns at this time. She is eager to continue to participate in CR.  30 Day ITP Review. Patient with good participation and attendance at phase 2 cardiac rehab.       Comments: See ITP comments.Barnet Pall, RN,BSN 01/22/2020 4:45 PM

## 2020-01-23 ENCOUNTER — Other Ambulatory Visit: Payer: Self-pay

## 2020-01-23 ENCOUNTER — Telehealth (HOSPITAL_COMMUNITY): Payer: Self-pay

## 2020-01-23 ENCOUNTER — Encounter (HOSPITAL_COMMUNITY)
Admission: RE | Admit: 2020-01-23 | Discharge: 2020-01-23 | Disposition: A | Discharge status: Home / Self Care | Payer: Medicare Other | Source: Ambulatory Visit | Attending: Cardiology | Admitting: Cardiology

## 2020-01-23 VITALS — Wt 128.1 lb

## 2020-01-23 DIAGNOSIS — I5022 Chronic systolic (congestive) heart failure: Secondary | ICD-10-CM

## 2020-01-23 NOTE — Telephone Encounter (Signed)
-----   Message from Larey Dresser, MD sent at 01/20/2020  4:39 PM EDT ----- Creatinine higher, encourage po hydration and repeat BMET 10 days.

## 2020-01-23 NOTE — Telephone Encounter (Signed)
Pt aware of results and recommendations. Encouraged to hydrate.  Pt  will rtc next week for labs. Verbalized understanding.

## 2020-01-26 ENCOUNTER — Encounter (HOSPITAL_COMMUNITY)
Admission: RE | Admit: 2020-01-26 | Discharge: 2020-01-26 | Disposition: A | Discharge status: Home / Self Care | Payer: Medicare Other | Source: Ambulatory Visit | Attending: Cardiology | Admitting: Cardiology

## 2020-01-26 ENCOUNTER — Other Ambulatory Visit: Payer: Self-pay

## 2020-01-26 DIAGNOSIS — I5022 Chronic systolic (congestive) heart failure: Secondary | ICD-10-CM | POA: Diagnosis not present

## 2020-01-28 ENCOUNTER — Other Ambulatory Visit: Payer: Self-pay

## 2020-01-28 ENCOUNTER — Encounter (HOSPITAL_COMMUNITY)
Admission: RE | Admit: 2020-01-28 | Discharge: 2020-01-28 | Disposition: A | Discharge status: Home / Self Care | Payer: Medicare Other | Source: Ambulatory Visit | Attending: Cardiology | Admitting: Cardiology

## 2020-01-28 DIAGNOSIS — I5022 Chronic systolic (congestive) heart failure: Secondary | ICD-10-CM

## 2020-01-30 ENCOUNTER — Ambulatory Visit (HOSPITAL_COMMUNITY)
Admission: RE | Admit: 2020-01-30 | Discharge: 2020-01-30 | Disposition: A | Discharge status: Home / Self Care | Payer: Medicare Other | Source: Ambulatory Visit | Attending: Cardiology | Admitting: Cardiology

## 2020-01-30 ENCOUNTER — Other Ambulatory Visit: Payer: Self-pay

## 2020-01-30 ENCOUNTER — Encounter (HOSPITAL_COMMUNITY)
Admission: RE | Admit: 2020-01-30 | Discharge: 2020-01-30 | Disposition: A | Discharge status: Home / Self Care | Payer: Medicare Other | Source: Ambulatory Visit | Attending: Cardiology | Admitting: Cardiology

## 2020-01-30 DIAGNOSIS — I5022 Chronic systolic (congestive) heart failure: Secondary | ICD-10-CM | POA: Insufficient documentation

## 2020-01-30 LAB — BASIC METABOLIC PANEL
Anion gap: 8 (ref 5–15)
BUN: 55 mg/dL — ABNORMAL HIGH (ref 8–23)
CO2: 22 mmol/L (ref 22–32)
Calcium: 9 mg/dL (ref 8.9–10.3)
Chloride: 109 mmol/L (ref 98–111)
Creatinine, Ser: 2.3 mg/dL — ABNORMAL HIGH (ref 0.44–1.00)
GFR calc Af Amer: 25 mL/min — ABNORMAL LOW (ref >60)
GFR calc non Af Amer: 21 mL/min — ABNORMAL LOW (ref >60)
Glucose, Bld: 75 mg/dL (ref 70–99)
Potassium: 5.2 mmol/L — ABNORMAL HIGH (ref 3.5–5.1)
Sodium: 139 mmol/L (ref 135–145)

## 2020-01-30 NOTE — Progress Notes (Signed)
PCP:  Derrill Center., MD   Cardiologist:  Fransico Him, MD HF Cardiolgoy: Dr Aundra Dubin  HPI:  Jeanne Bruce is a 64 y.o. female who has a history of CKD stage 4, chronic systolic CHF, multiple sclerosis, and right nephrectomy.  She was referred by Dr. Radford Pax for CHF evaluation.  Patient also had remote colon cancer with colostomy.  She has had frequent UTIs and was admitted in 10/20 with urosepsis.  She had respiratory arrest and VF arrest in the ER and was intubated.  Echo was done, showing EF 25% with apical ballooning.  LHC showed no significant coronary disease.  She ended up having right nephrectomy for renal abscess in 10/20.    Echo was repeated in 12/20, showing EF still low at 25-30% with peri-apical akinesis.   She has had a hard time taking cardiac medications due to orthostatic symptoms with low BP.  She was sent home from the hospital in 10/20 on hydralazine/Imdur but had to stop due to low BP.    Echo was done today and reviewed, EF 25% with septal-lateral dyssynchrony, normal RV.    Recently returned to HF Clinic for follow up on 01/20/20 with Dr. Aundra Dubin.  She was only taking spironolactone.  She misunderstood instructions at last appt and stopped carvedilol. She denied lightheadedness on spironolactone, and had been doing ok on carvedilol alone prior to that.  She had been going to cardiac rehab, and she felt stronger overall.  No dyspnea walking on flat ground or walking up a flight of stairs. No chest pain.  No lightheadedness.  Today she returns to HF clinic for pharmacist medication titration. At last visit with MD, carvedilol 6.25 mg BID was restarted. However, she was only restarted carvedilol at 3.125 mg BID. Overall she is doing well with that change. No dizziness, lightheadedness, chest pains or palpitations. No SOB/DOE unless going uphill or upstairs. Her weight at home has been stable at 120-125 lbs. Clinic weight 132 lbs, which is up ~3 lbs from last visit. Not taking any  diuretics. No LEE, PND or orthopnea. Her appetite is much improved recently. She has been trying to follow a low K diet since her potassium was elevated on last labs. However, she noted that she wasn't completely sure which foods to cut out. I provided her with a patient education handout detailing foods low and high in potassium. She stated she does eat a lot of oranges and drinks orange juice, so she will start limiting those foods. Tolerating all medications.   HF Medications: Carvedilol 6.25 mg BID Spironolactone 12.5 mg daily  Has the patient been experiencing any side effects to the medications prescribed?  no  Does the patient have any problems obtaining medications due to transportation or finances?   No - has Film/video editor  Understanding of regimen: good Understanding of indications: good Potential of compliance: good Patient understands to avoid NSAIDs. Patient understands to avoid decongestants.    Pertinent Lab Values (01/30/20): Marland Kitchen Serum creatinine 2.30., BUN 55, Potassium 5.2, Sodium 139, BNP 53.8 (11/07/19) . BMET today pending  Vital Signs: . Weight: 132.0 lbs (last clinic weight: 128.1 lbs) . Blood pressure: 136/82  . Heart rate: 67   Assessment: 1. Chronic systolic CHF: Nonischemic cardiomyopathy, LHC in 10/20 with no significant coronary disease.  Echo in 10/20 was suggestive of stress (Takotsubo-type) cardiomyopathy with apical ballooning (from severe urosepsis).  However, LV function did not improve on repeat echo in 12/20 or echo done today (EF still 25%).  ?  LBBB cardiomyopathy.   -NYHA class II symptoms, euvolemic on exam.  She has had a hard time tolerating cardiac meds due to orthostasis, but this has been improved recently.   - Continue spironolactone 12.5 mg daily. Provided education on low potassium diets today. BMET pending.   - Increase carvedilol to 6.25 mg BID (she only restarted carvedilol at 3.125 mg BID after last visit).    - No  ACEI/ARNI/ARB for now with CKD stage 4.  - Continue cardiac rehab.  - EF has not recovered by echo done today, will need to consider CRT-D device (she has chronic LBBB, QRS 154 msec). Referred to EP evaluation.  2. CKD stage 4: S/p right nephrectomy in 10/20.  [facility]rology [facility].  - BMET today.  3. Chronic LBBB: See above regarding EP referral for CRT-D.    Plan: 1) Medication changes: Based on clinical presentation, vital signs and recent labs will increase carvedilol to 6.25 mg BID. Provided information on low potassium diet today - she will start limiting her consumption of oranges.  2) Labs: BMET today 3) Follow-up: 3 months with Dr. Rush Farmer, PharmD, BCPS, [facility], CPP Heart Failure Clinic Pharmacist [phone]

## 2020-02-10 ENCOUNTER — Other Ambulatory Visit: Payer: Self-pay

## 2020-02-10 ENCOUNTER — Ambulatory Visit (HOSPITAL_COMMUNITY)
Admission: RE | Admit: 2020-02-10 | Discharge: 2020-02-10 | Disposition: A | Discharge status: Home / Self Care | Payer: Medicare Other | Source: Ambulatory Visit | Attending: Cardiology | Admitting: Cardiology

## 2020-02-10 VITALS — BP 136/82 | HR 67 | Wt 132.0 lb

## 2020-02-10 DIAGNOSIS — Z905 Acquired absence of kidney: Secondary | ICD-10-CM | POA: Diagnosis not present

## 2020-02-10 DIAGNOSIS — I5022 Chronic systolic (congestive) heart failure: Secondary | ICD-10-CM | POA: Diagnosis present

## 2020-02-10 DIAGNOSIS — G35 Multiple sclerosis: Secondary | ICD-10-CM | POA: Diagnosis not present

## 2020-02-10 DIAGNOSIS — I428 Other cardiomyopathies: Secondary | ICD-10-CM | POA: Diagnosis not present

## 2020-02-10 DIAGNOSIS — Z79899 Other long term (current) drug therapy: Secondary | ICD-10-CM | POA: Insufficient documentation

## 2020-02-10 DIAGNOSIS — I447 Left bundle-branch block, unspecified: Secondary | ICD-10-CM | POA: Diagnosis not present

## 2020-02-10 DIAGNOSIS — N184 Chronic kidney disease, stage 4 (severe): Secondary | ICD-10-CM | POA: Insufficient documentation

## 2020-02-10 DIAGNOSIS — Z85038 Personal history of other malignant neoplasm of large intestine: Secondary | ICD-10-CM | POA: Insufficient documentation

## 2020-02-10 DIAGNOSIS — I130 Hypertensive heart and chronic kidney disease with heart failure and stage 1 through stage 4 chronic kidney disease, or unspecified chronic kidney disease: Secondary | ICD-10-CM | POA: Insufficient documentation

## 2020-02-10 LAB — BASIC METABOLIC PANEL
Anion gap: 8 (ref 5–15)
BUN: 55 mg/dL — ABNORMAL HIGH (ref 8–23)
CO2: 21 mmol/L — ABNORMAL LOW (ref 22–32)
Calcium: 8.8 mg/dL — ABNORMAL LOW (ref 8.9–10.3)
Chloride: 110 mmol/L (ref 98–111)
Creatinine, Ser: 2.35 mg/dL — ABNORMAL HIGH (ref 0.44–1.00)
GFR calc Af Amer: 24 mL/min — ABNORMAL LOW (ref >60)
GFR calc non Af Amer: 21 mL/min — ABNORMAL LOW (ref >60)
Glucose, Bld: 88 mg/dL (ref 70–99)
Potassium: 5.1 mmol/L (ref 3.5–5.1)
Sodium: 139 mmol/L (ref 135–145)

## 2020-02-10 NOTE — Patient Instructions (Signed)
It was a pleasure seeing you today!  MEDICATIONS: -We are changing your medications today -Increase carvedilol to 6.25 mg (1 tablet) twice daily. -Call if you have questions about your medications.  LABS: -We will call you if your labs need attention.  NEXT APPOINTMENT: Return to clinic in 3 months with Dr. Aundra Dubin.  In general, to take care of your heart failure: -Limit your fluid intake to 2 Liters (half-gallon) per day.   -Limit your salt intake to ideally 2-3 grams (2000-3000 mg) per day. -Weigh yourself daily and record, and bring that "weight diary" to your next appointment.  (Weight gain of 2-3 pounds in 1 day typically means fluid weight.) -The medications for your heart are to help your heart and help you live longer.   -Please contact us before stopping any of your heart medications.  Call the clinic at [phone] with questions or to reschedule future appointments.

## 2020-02-12 ENCOUNTER — Ambulatory Visit (INDEPENDENT_AMBULATORY_CARE_PROVIDER_SITE_OTHER): Payer: Medicare Other | Admitting: Cardiology

## 2020-02-12 ENCOUNTER — Other Ambulatory Visit: Payer: Self-pay

## 2020-02-12 ENCOUNTER — Encounter: Payer: Self-pay | Admitting: Cardiology

## 2020-02-12 VITALS — BP 106/58 | HR 72 | Ht 60.0 in | Wt 133.0 lb

## 2020-02-12 DIAGNOSIS — I428 Other cardiomyopathies: Secondary | ICD-10-CM

## 2020-02-12 NOTE — Progress Notes (Signed)
Electrophysiology Office Note   Date:  02/12/2020   ID:  Crystalann Korf, DOB 07/06/1956, MRN [id number]  PCP:  Derrill Center., MD  Cardiologist:  Aundra Dubin Primary Electrophysiologist:  Brithany Whitworth Meredith Leeds, MD    Chief Complaint: CHF   History of Present Illness: Jeanne Bruce is a 64 y.o. female who is being seen today for the evaluation of CHF at the request of Larey Dresser, MD. Presenting today for electrophysiology evaluation.  She has a history significant for stage IV CKD, chronic systolic heart failure due to nonischemic cardiomyopathy, multiple sclerosis, and right nephrectomy.  She also has colon cancer and has had a colostomy.  She has frequent UTIs and was admitted October 2020 with urosepsis.  She had respiratory arrest and VF arrest in the emergency room and was intubated.  She had an echo done that showed an ejection fraction of 25% with apical ballooning.  Left heart catheterization showed no significant coronary artery disease.  She had a right nephrectomy for renal abscess during that admission.  Echo December 2020 showed a persistently low ejection fraction of 25 to 30% with periapical akinesis.  She did not tolerate cardiac meds due to orthostatic symptoms.  Today, she is on both carvedilol and Aldactone.  Unfortunately her ejection fraction has remained low.  Today, she denies symptoms of palpitations, chest pain, shortness of breath, orthopnea, PND, lower extremity edema, claudication, dizziness, presyncope, syncope, bleeding, or neurologic sequela. The patient is tolerating medications without difficulties.    Past Medical History:  Diagnosis Date  . Cancer ([place])    colon (resolved)  . Hypertension   . Multiple sclerosis ([place])   . Renal disorder    Past Surgical History:  Procedure Laterality Date  . LEFT HEART CATH AND CORONARY ANGIOGRAPHY N/A 08/14/2019   Procedure: LEFT HEART CATH AND CORONARY ANGIOGRAPHY;  Surgeon: Burnell Blanks, MD;  Location: [facility];  Service: Cardiovascular;  Laterality: N/A;  . NEPHRECTOMY TRANSPLANTED ORGAN       Current Outpatient Medications  Medication Sig Dispense Refill  . carvedilol (COREG) 6.25 MG tablet Take 1 tablet (6.25 mg total) by mouth 2 (two) times daily. 60 tablet 3  . cholecalciferol (VITAMIN D3) 25 MCG (1000 UT) tablet Take 2,000 Units by mouth daily.    Marland Kitchen conjugated estrogens (PREMARIN) vaginal cream Place 1 Applicatorful vaginally at bedtime.    . Cyanocobalamin (VITAMIN B12 PO) Take by mouth daily.    Marland Kitchen escitalopram (LEXAPRO) 10 MG tablet Take 10 mg by mouth daily.    . magnesium oxide (MAG-OX) 400 MG tablet Take 400 mg by mouth daily.    Marland Kitchen omeprazole (PRILOSEC) 20 MG capsule Take 20 mg by mouth 2 (two) times daily.    . ondansetron (ZOFRAN) 4 MG tablet Take 4 mg by mouth every 8 (eight) hours as needed for nausea or vomiting.    . Probiotic Product (PROBIOTIC DAILY PO) Take 1 capsule by mouth daily.    . solifenacin (VESICARE) 5 MG tablet Take by mouth.    . spironolactone (ALDACTONE) 25 MG tablet Take 0.5 tablets (12.5 mg total) by mouth daily. 45 tablet 3  . UNABLE TO FIND [employer] nutritional supplement.     No current facility-administered medications for this visit.    Allergies:   Ativan [lorazepam], Invanz [ertapenem], Mirabegron, Claritin-d 12 hour [loratadine-pseudoephedrine er], and Penicillins   Social History:  The patient  reports that she has never smoked. She has never used smokeless tobacco. She reports  previous alcohol use. She reports that she does not use drugs.   Family History:  The patient's family history includes Atrial fibrillation in her father.    ROS:  Please see the history of present illness.   Otherwise, review of systems is positive for none.   All other systems are reviewed and negative.    PHYSICAL EXAM: VS:  BP (!) 106/58   Pulse 72   Ht 5' (1.524 m)   Wt 133 lb (60.3 kg)   SpO2 94%   BMI 25.97 kg/m  , BMI Body mass index is  25.97 kg/m. GEN: Well nourished, well developed, in no acute distress  HEENT: normal  Neck: no JVD, carotid bruits, or masses Cardiac: RRR; no murmurs, rubs, or gallops,no edema  Respiratory:  clear to auscultation bilaterally, normal work of breathing GI: soft, nontender, nondistended, + BS MS: no deformity or atrophy  Skin: warm and dry Neuro:  Strength and sensation are intact Psych: euthymic mood, full affect  EKG:  EKG is not ordered today. Personal review of the ekg ordered 01/20/20 shows anus rhythm, left bundle branch block, rate 63  Recent Labs: 08/12/2019: Magnesium 2.3 08/13/2019: ALT 27 08/16/2019: Hemoglobin 9.0; Platelets 237 11/07/2019: B Natriuretic Peptide 53.8 02/10/2020: BUN 55; Creatinine, Ser 2.35; Potassium 5.1; Sodium 139    Lipid Panel     Component Value Date/Time   CHOL 160 08/11/2019 0830   TRIG 165 (H) 08/11/2019 0830   HDL 35 (L) 08/11/2019 0830   CHOLHDL 4.6 08/11/2019 0830   VLDL 33 08/11/2019 0830   LDLCALC 92 08/11/2019 0830     Wt Readings from Last 3 Encounters:  02/12/20 133 lb (60.3 kg)  02/10/20 132 lb (59.9 kg)  01/23/20 128 lb 1.4 oz (58.1 kg)      Other studies Reviewed: Additional studies/ records that were reviewed today include: TTE 01/20/20  Review of the above records today demonstrates:  1. Left ventricular ejection fraction, by estimation, is 25%. The left  ventricle has severely decreased function. The left ventricle demonstrates  global hypokinesis with septal-lateral dyssynchrony consistent with LBBB.  Left ventricular diastolic  parameters are consistent with Grade I diastolic dysfunction (impaired  relaxation).  2. Right ventricular systolic function is normal. The right ventricular  size is normal. Tricuspid regurgitation signal is inadequate for assessing  PA pressure.  3. Left atrial size was mildly dilated.  4. The mitral valve is normal in structure. Trivial mitral valve  regurgitation. No evidence of  mitral stenosis.  5. The aortic valve is tricuspid. Aortic valve regurgitation is trivial.  No aortic stenosis is present.  6. The inferior vena cava is normal in size with <50% respiratory  variability, suggesting right atrial pressure of 8 mmHg.    ASSESSMENT AND PLAN:  1.  Chronic systolic heart failure due to nonischemic cardiomyopathy: Unfortunately LV function did not improve when she appeared to have had a stress-induced cardiomyopathy.  Currently on Aldactone and carvedilol.  Her QRS is widened and thus would potentially benefit from CRT-D implant.  Risks and benefits were discussed risks of bleeding, tamponade, infection, pneumothorax.  She understands these risks and is agreed to the procedure.  I also did discuss with her the fact that she Tymier Lindholm get IV contrast.  She does have stage IV CKD which is complicating.  2.  CKD stage IV: Had a nephrectomy October 2020 at [facility].  Follows with their nephrology.  Discussed with referring cardiologist  Current medicines are reviewed at length with the  patient today.   The patient does not have concerns regarding her medicines.  The following changes were made today:  none  Labs/ tests ordered today include:  No orders of the defined types were placed in this encounter.    Disposition:   FU with Easten Maceachern 3 months  Signed, Karie Skowron Meredith Leeds, MD  02/12/2020 11:02 AM     [facility] [address] [place] [address] [phone] (office) [phone] (fax)

## 2020-02-12 NOTE — Patient Instructions (Signed)
Medication Instructions:  Your physician recommends that you continue on your current medications as directed. Please refer to the Current Medication list given to you today.  *If you need a refill on your cardiac medications before your next appointment, please call your pharmacy*   Lab Work: None ordered If you have labs (blood work) drawn today and your tests are completely normal, you will receive your results only by: Marland Kitchen MyChart Message (if you have MyChart) OR . A paper copy in the mail If you have any lab test that is abnormal or we need to change your treatment, we will call you to review the results.   Testing/Procedures: None ordered   Follow-Up: At [facility], you and your health needs are our priority.  As part of our continuing mission to provide you with exceptional heart care, we have created designated Provider Care Teams.  These Care Teams include your primary Cardiologist (physician) and Advanced Practice Providers (APPs -  Physician Assistants and Nurse Practitioners) who all work together to provide you with the care you need, when you need it.  We recommend signing up for the patient portal called "MyChart".  Sign up information is provided on this After Visit Summary.  MyChart is used to connect with patients for Virtual Visits (Telemedicine).  Patients are able to view lab/test results, encounter notes, upcoming appointments, etc.  Non-urgent messages can be sent to your provider as well.   To learn more about what you can do with MyChart, go to [email or web address].    Your next appointment:   10-14 day(s) after your procedure  The format for your next appointment:   In Person  Provider:   device clinic for a wound check   Your physician recommends that you schedule a follow-up appointment in: 3 months, after your procedure, with Dr. Curt Bears.  Thank you for choosing [facility]!!   Trinidad Curet, RN [phone]    Other Instructions    Implantable Device Instructions  You are scheduled for: Bi-Ventricular cardiac defibrillator on 04/05/2020 with Dr. Curt Bears.  1.   Pre procedure testing-             A.  LAB WORK--- On _____________.  You do not need to be fasting.               B. COVID TEST-- On 04/03/2020 @ 10:00 am - You will go to [facility] hospital ([place]) for your Covid testing.   This is a drive thru test site.  There will be multiple testing areas.  Be sure to share with the first checkpoint that you are there for pre-procedure/surgery testing. This will put you into the right (yellow) lane that leads to the PAT testing team.   Stay in your car and the nurse team will come to your car to test you.  After you are tested please go home and self quarantine until the day of your procedure.    2. On the day of your procedure 04/05/2020 you will go to [facility] [phone]) at 8:30 am.  Dennis Bast will go to the main entrance [address] [employer]) and enter where the [employer] are.  You will check in at ADMITTING.  You may have one support person come in to the hospital with you.  They will be asked to wait in the waiting room.   3.   Do not eat or drink after midnight prior to your procedure.  4.   On the morning of your procedure do NOT take any medication.  5.  The night before your procedure and the morning of your procedure scrub your neck/chest with surgical scrub.  An instruction letter is included below.   5.  Plan for an overnight stay.  If you use your phone frequently bring your phone charger.  When you are discharged you will need someone to drive you home.   6.  You will follow up with the [place] clinic 10-14 days after your procedure. You will follow up with Dr. Curt Bears 91 days after your procedure.  These appointments will be made for you.   * If you have ANY questions after you get home, please call the office [phone] and ask for Bertil Brickey RN or send a  MyChart message.    [facility] - Preparing For Surgery (surgical scrub)  Before surgery, you can play an important role. Because skin is not sterile, your skin needs to be as free of germs as possible. You can reduce the number of germs on your skin by washing with CHG (chlorahexidine gluconate) Soap before surgery.  CHG is an antiseptic cleaner which kills germs and bonds with the skin to continue killing germs even after washing.   Please do not use if you have an allergy to CHG or antibacterial soaps.  If your skin becomes reddened/irritated stop using the CHG.   Do not shave (including legs and underarms) for at least 48 hours prior to first CHG shower.  It is OK to shave your face.  Please follow these instructions carefully:  1.  Shower the night before surgery and the morning of surgery with CHG.  2.  If you choose to wash your hair, wash your hair first as usual with your normal shampoo.  3.  After you shampoo, rinse your hair and body thoroughly to remove the shampoo.  4.  Use CHG as you would any other liquid soap.  You can apply CHG directly to the skin and wash gently with a clean washcloth. 5.  Apply the CHG Soap to your body ONLY FROM THE NECK DOWN.  Do not use on open wounds or open sores.  Avoid contact with your eyes, ears, mouth and genitals (private parts).  Wash genitals (private parts) with your normal soap.  6.  Wash thoroughly, paying special attention to the area where your surgery will be performed.  7.  Thoroughly rinse your body with warm water from the neck down.   8.  DO NOT shower/wash with your normal soap after using and rinsing off the CHG soap.  9.  Pat yourself dry with a clean towel.           10.  Wear clean pajamas.           11.  Place clean sheets on your bed the night of your first shower and do not sleep with pets.  Day of Surgery: Do not apply any deodorants/lotions.  Please wear clean clothes to the hospital/surgery center.     Cardioverter  Defibrillator Implantation  An implantable cardioverter defibrillator (ICD) is a small device that is placed under the skin in the chest or abdomen. An ICD consists of a battery, a small computer (pulse generator), and wires (leads) that go into the heart. An ICD is used to detect and correct two types of dangerous irregular heartbeats (arrhythmias):  A rapid heart rhythm (tachycardia).  An arrhythmia in which the lower  chambers of the heart (ventricles) contract in an uncoordinated way (fibrillation). When an ICD detects tachycardia, it sends a low-energy shock to the heart to restore the heartbeat to normal (cardioversion). This signal is usually painless. If cardioversion does not work or if the ICD detects fibrillation, it delivers a high-energy shock to the heart (defibrillation) to restart the heart. This shock may feel like a strong jolt in the chest. Your health care provider may prescribe an ICD if:  You have had an arrhythmia that originated in the ventricles.  Your heart has been damaged by a disease or heart condition. Sometimes, ICDs are programmed to act as a device called a pacemaker. Pacemakers can be used to treat a slow heartbeat (bradycardia) or tachycardia by taking over the heart rate with electrical impulses. Tell a health care provider about:  Any allergies you have.  All medicines you are taking, including vitamins, herbs, eye drops, creams, and over-the-counter medicines.  Any problems you or family members have had with anesthetic medicines.  Any blood disorders you have.  Any surgeries you have had.  Any medical conditions you have.  Whether you are pregnant or may be pregnant. What are the risks? Generally, this is a safe procedure. However, problems may occur, including:  Swelling, bleeding, or bruising.  Infection.  Blood clots.  Damage to other structures or organs, such as nerves, blood vessels, or the heart.  Allergic reactions to medicines  used during the procedure. What happens before the procedure? Staying hydrated Follow instructions from your health care provider about hydration, which may include:  Up to 2 hours before the procedure - you may continue to drink clear liquids, such as water, clear fruit juice, black coffee, and plain tea. Eating and drinking restrictions Follow instructions from your health care provider about eating and drinking, which may include:  8 hours before the procedure - stop eating heavy meals or foods such as meat, fried foods, or fatty foods.  6 hours before the procedure - stop eating light meals or foods, such as toast or cereal.  6 hours before the procedure - stop drinking milk or drinks that contain milk.  2 hours before the procedure - stop drinking clear liquids. Medicine Ask your health care provider about:  Changing or stopping your normal medicines. This is important if you take diabetes medicines or blood thinners.  Taking medicines such as aspirin and ibuprofen. These medicines can thin your blood. Do not take these medicines before your procedure if your doctor tells you not to. Tests  You may have blood tests.  You may have a test to check the electrical signals in your heart (electrocardiogram, ECG).  You may have imaging tests, such as a chest X-ray. General instructions  For 24 hours before the procedure, stop using products that contain nicotine or tobacco, such as cigarettes and e-cigarettes. If you need help quitting, ask your health care provider.  Plan to have someone take you home from the hospital or clinic.  You may be asked to shower with a germ-killing soap. What happens during the procedure?  To reduce your risk of infection: ? Your health care team will wash or sanitize their hands. ? Your skin will be washed with soap. ? Hair may be removed from the surgical area.  Small monitors will be put on your body. They will be used to check your heart,  blood pressure, and oxygen level.  An IV tube will be inserted into one of your veins.  You will be given one or more of the following: ? A medicine to help you relax (sedative). ? A medicine to numb the area (local anesthetic). ? A medicine to make you fall asleep (general anesthetic).  Leads will be guided through a blood vessel into your heart and attached to your heart muscles. Depending on the ICD, the leads may go into one ventricle or they may go into both ventricles and into an upper chamber of the heart. An X-ray machine (fluoroscope) will be usedto help guide the leads.  A small incision will be made to create a deep pocket under your skin.  The pulse generator will be placed into the pocket.  The ICD will be tested.  The incision will be closed with stitches (sutures), skin glue, or staples.  A bandage (dressing) will be placed over the incision. This procedure may vary among health care providers and hospitals. What happens after the procedure?  Your blood pressure, heart rate, breathing rate, and blood oxygen level will be monitored often until the medicines you were given have worn off.  A chest X-ray will be taken to check that the ICD is in the right place.  You will need to stay in the hospital for 1-2 days so your health care provider can make sure your ICD is working.  Do not drive for 24 hours if you received a sedative. Ask your health care provider when it is safe for you to drive.  You may be given an identification card explaining that you have an ICD. Summary  An implantable cardioverter defibrillator (ICD) is a small device that is placed under the skin in the chest or abdomen. It is used to detect and correct dangerous irregular heartbeats (arrhythmias).  An ICD consists of a battery, a small computer (pulse generator), and wires (leads) that go into the heart.  When an ICD detects rapid heart rhythm (tachycardia), it sends a low-energy shock to the  heart to restore the heartbeat to normal (cardioversion). If cardioversion does not work or if the ICD detects uncoordinated heart contractions (fibrillation), it delivers a high-energy shock to the heart (defibrillation) to restart the heart.  You will need to stay in the hospital for 1-2 days to make sure your ICD is working. This information is not intended to replace advice given to you by your health care provider. Make sure you discuss any questions you have with your health care provider. Document Revised: 09/14/2017 Document Reviewed: 10/11/2016 Elsevier Patient Education  2020 [employer].

## 2020-02-19 NOTE — Addendum Note (Signed)
Encounter addended by: George Ina, RD on: 02/19/2020 8:18 AM  Actions taken: Flowsheet accepted

## 2020-02-24 NOTE — Addendum Note (Signed)
Encounter addended by: Magda Kiel, RN on: 02/24/2020 1:25 PM  Actions taken: Flowsheet data copied forward, Flowsheet accepted, Clinical Note Signed

## 2020-02-24 NOTE — Progress Notes (Signed)
Discharge Progress Report  Patient Details  Name: Jeanne Bruce MRN: [id number] Date of Birth: Dec 18, 1955 Referring Provider:     [place] from 11/25/2019 in [place]  Referring Provider  Loralie Champagne, MD       Number of Visits: 22  Reason for Discharge:  Patient reached a stable level of exercise. Patient has met program and personal goals.  Smoking History:  Social History   Tobacco Use  Smoking Status Never Smoker  Smokeless Tobacco Never Used    Diagnosis:  Chronic systolic CHF (congestive heart failure) ([place])  ADL [facility]:   Initial Exercise Prescription: Initial Exercise Prescription - 11/25/19 1300      Date of Initial Exercise RX and Referring Provider   Date  11/25/19    Referring Provider  Loralie Champagne, MD    Expected Discharge Date  01/30/20      Recumbant Bike   Level  2    Watts  15    Minutes  15    METs  2.83      NuStep   Level  2    SPM  85    Minutes  15    METs  2      Prescription Details   Frequency (times per week)  3    Duration  Progress to 30 minutes of continuous aerobic without signs/symptoms of physical distress      Intensity   THRR 40-80% of Max Heartrate  62-124    Ratings of Perceived Exertion  11-13    Perceived Dyspnea  0-4      Progression   Progression  Continue to progress workloads to maintain intensity without signs/symptoms of physical distress.      Resistance Training   Training Prescription  Yes    Weight  2 lbs.    Reps  10-15       Discharge Exercise Prescription (Final Exercise Prescription Changes): Exercise Prescription Changes - 01/30/20 1541      Response to Exercise   Blood Pressure (Admit)  110/76    Blood Pressure (Exercise)  118/60    Blood Pressure (Exit)  102/68    Heart Rate (Admit)  76 bpm    Heart Rate (Exercise)  84 bpm    Heart Rate (Exit)  70 bpm    Rating of Perceived Exertion (Exercise)  12    Perceived Dyspnea  (Exercise)  0    Symptoms  None    Comments  None    Duration  Progress to 30 minutes of  aerobic without signs/symptoms of physical distress    Intensity  THRR unchanged      Progression   Progression  Continue to progress workloads to maintain intensity without signs/symptoms of physical distress.    Average METs  2.1      Resistance Training   Training Prescription  Yes    Weight  2 lbs.    Reps  10-15    Time  10 Minutes      Recumbant Bike   Level  2    Watts  10    Minutes  15    METs  2.2      NuStep   Level  2    SPM  70    Minutes  15    METs  1.9      Home Exercise Plan   Plans to continue exercise at  Home (comment)   Walking & Ellipitical  Frequency  Add 2 additional days to program exercise sessions.    Initial Home Exercises Provided  01/21/20       Functional Capacity: 6 Minute Walk    Row Name 11/25/19 1157 01/23/20 1353       6 Minute Walk   Phase  Initial  Discharge    Distance  1243 feet  1262 feet    Distance % Change  --  1.53 %    Distance Feet Change  --  19 ft    Walk Time  6 minutes  6 minutes    # of Rest Breaks  0  0    MPH  2.35  2.39    METS  3.08  2.9    RPE  11  12    Perceived Dyspnea   1  0    VO2 Peak  10.79  10.15    Symptoms  Yes (comment)  No    Comments  Mild SOB  --    Resting HR  64 bpm  75 bpm    Resting BP  156/68  110/60    Resting Oxygen Saturation   99 %  100 %    Exercise Oxygen Saturation  during 6 min walk  97 %  97 %    Max Ex. HR  94 bpm  89 bpm    Max Ex. BP  134/78  118/56    2 Minute Post BP  122/78  108/56       Psychological, QOL, Others - Outcomes: PHQ 2/9: Depression screen [facility] 2/9 02/24/2020 11/25/2019  Decreased Interest 0 0  Down, Depressed, Hopeless 0 0  PHQ - 2 Score 0 0    Quality of Life: Quality of Life - 01/28/20 1404      Quality of Life   Select  Quality of Life      Quality of Life Scores   Health/Function Pre  19.37 %    Health/Function Post  25.39 %    Health/Function  % Change  31.08 %    Socioeconomic Pre  29.58 %    Socioeconomic Post  27 %    Socioeconomic % Change   -8.72 %    Psych/Spiritual Pre  27.36 %    Psych/Spiritual Post  30 %    Psych/Spiritual % Change  9.65 %    Family Pre  25.2 %    Family Post  30 %    Family % Change  19.05 %    GLOBAL Pre  23.8 %    GLOBAL Post  27.39 %    GLOBAL % Change  15.08 %       Personal Goals: Goals established at orientation with interventions provided to work toward goal. Personal Goals and Risk Factors at Admission - 11/25/19 1331      Core Components/Risk Factors/Patient Goals on Admission   Heart Failure  Yes    Intervention  Provide a combined exercise and nutrition program that is supplemented with education, support and counseling about heart failure. Directed toward relieving symptoms such as shortness of breath, decreased exercise tolerance, and extremity edema.    Expected Outcomes  Improve functional capacity of life;Short term: Attendance in program 2-3 days a week with increased exercise capacity. Reported lower sodium intake. Reported increased fruit and vegetable intake. Reports medication compliance.;Short term: Daily weights obtained and reported for increase. Utilizing diuretic protocols set by physician.;Long term: Adoption of self-care skills and reduction of barriers for early signs and symptoms  recognition and intervention leading to self-care maintenance.    Hypertension  Yes    Intervention  Provide education on lifestyle modifcations including regular physical activity/exercise, weight management, moderate sodium restriction and increased consumption of fresh fruit, vegetables, and low fat dairy, alcohol moderation, and smoking cessation.;Monitor prescription use compliance.    Expected Outcomes  Short Term: Continued assessment and intervention until BP is < 140/21m HG in hypertensive participants. < 130/859mHG in hypertensive participants with diabetes, heart failure or chronic  kidney disease.;Long Term: Maintenance of blood pressure at goal levels.        Personal Goals Discharge: Goals and Risk Factor Review    Row Name 12/10/19 1227 12/23/19 1300 01/22/20 1428 02/24/20 1303       Core Components/Risk Factors/Patient Goals Review   Personal Goals Review  Heart Failure;Hypertension  Heart Failure;Hypertension  Heart Failure;Hypertension  Heart Failure;Hypertension    Review  Ms. Bowker has several CAD risk factors. She is eager to participate in CR to reduce her risk factors and improve her quality of life.  Ms. HiPotteigeras several CAD risk factors. She is taking her medications as prescribed and her BP is now controlled with the increase dosage of coreg. She weighs daily and follows the HF action plan. She is beginning to exercise at home by walking.  Ms. HiWygantas several CAD risk factors. She is taking her medications as prescribed. Vital signs have been stable. She weighs daily and follows the HF action plan. She is beginning to exercise at home by walking.  Ms. HiStrandas several CAD risk factors. She is taking her medications as prescribed. Vital signs have been stable. Kemba graduated on 01/30/20 from cardiac rehab    Expected Outcomes  Ms HiDepauloill continue to participate in CR exercise sessions.  Ms HiHostlerill continue to participate in CR exercise sessions.  Ms HiMuelaill continue to participate in CR exercise sessions for risk factor modification  Raquelle will continue to exercise at home by walking upon discharge from phase 2 cardiac rehab continue nutrtion and lifestyle modifications       Exercise Goals and Review: Exercise Goals    Row Name 11/25/19 1148             Exercise Goals   Increase Physical Activity  Yes       Intervention  Provide advice, education, support and counseling about physical activity/exercise needs.;Develop an individualized exercise prescription for aerobic and resistive training based on initial evaluation findings, risk stratification,  comorbidities and participant's personal goals.       Expected Outcomes  Short Term: Attend rehab on a regular basis to increase amount of physical activity.;Long Term: Exercising regularly at least 3-5 days a week.;Long Term: Add in home exercise to make exercise part of routine and to increase amount of physical activity.       Increase Strength and Stamina  Yes       Intervention  Provide advice, education, support and counseling about physical activity/exercise needs.;Develop an individualized exercise prescription for aerobic and resistive training based on initial evaluation findings, risk stratification, comorbidities and participant's personal goals.       Expected Outcomes  Short Term: Increase workloads from initial exercise prescription for resistance, speed, and METs.;Short Term: Perform resistance training exercises routinely during rehab and add in resistance training at home;Long Term: Improve cardiorespiratory fitness, muscular endurance and strength as measured by increased METs and functional capacity (6MWT)       Able to understand and  use rate of perceived exertion (RPE) scale  Yes       Intervention  Provide education and explanation on how to use RPE scale       Expected Outcomes  Short Term: Able to use RPE daily in rehab to express subjective intensity level;Long Term:  Able to use RPE to guide intensity level when exercising independently       Knowledge and understanding of Target Heart Rate Range (THRR)  Yes       Intervention  Provide education and explanation of THRR including how the numbers were predicted and where they are located for reference       Expected Outcomes  Short Term: Able to state/look up THRR;Long Term: Able to use THRR to govern intensity when exercising independently;Short Term: Able to use daily as guideline for intensity in rehab       Able to check pulse independently  Yes       Intervention  Provide education and demonstration on how to check pulse in  carotid and radial arteries.;Review the importance of being able to check your own pulse for safety during independent exercise       Expected Outcomes  Long Term: Able to check pulse independently and accurately;Short Term: Able to explain why pulse checking is important during independent exercise       Understanding of Exercise Prescription  Yes       Intervention  Provide education, explanation, and written materials on patient's individual exercise prescription       Expected Outcomes  Short Term: Able to explain program exercise prescription;Long Term: Able to explain home exercise prescription to exercise independently          Exercise Goals Re-Evaluation: Exercise Goals Re-Evaluation    Row Name 01/22/20 1616 02/13/20 1545           Exercise Goal Re-Evaluation   Exercise Goals Review  Increase Physical Activity;Increase Strength and Stamina;Able to understand and use rate of perceived exertion (RPE) scale;Knowledge and understanding of Target Heart Rate Range (THRR);Able to check pulse independently;Understanding of Exercise Prescription  Increase Physical Activity;Increase Strength and Stamina;Able to understand and use rate of perceived exertion (RPE) scale;Knowledge and understanding of Target Heart Rate Range (THRR);Able to check pulse independently;Understanding of Exercise Prescription      Comments  Reviewed HEP with pt. Also reviewed THRR, weather conditions, endpoints of exercise, RPE scale, warmup and cool down. Pt puts forth minimal effort with exericse and is not progressing. Will continue to work with pt on functional capacity.  Pt completed 22 sessions of cardiac rehab. Pt did not significantly increase functional capacity, however states she is feeling better since starting program. Pt increased post 6MWT distance by 2f. Stressed the importance of exercising consistently to pt.      Expected Outcomes  Pt will continue to walk 2 days a week for 15-20 minutes. Encouraged pt  to try and progress to 30 minutes. Will continue to monitor.  Pt will continue to exercise 3-4 days a week for 20-35 minutes.         Nutrition & Weight - Outcomes: Pre Biometrics - 11/25/19 1206      Pre Biometrics   Waist Circumference  31 inches    Hip Circumference  38 inches    Waist to Hip Ratio  0.82 %    Triceps Skinfold  26 mm    % Body Fat  35.5 %    Grip Strength  21 kg    Flexibility  11.25 in    Single Leg Stand  8.5 seconds      Post Biometrics - 01/23/20 1355       Post  Biometrics   Weight  58.1 kg    Waist Circumference  33 inches    Hip Circumference  37.5 inches    Waist to Hip Ratio  0.88 %    Triceps Skinfold  25.5 mm    % Body Fat  36.3 %    Grip Strength  15 kg    Flexibility  15 in    Single Leg Stand  9.93 seconds       Nutrition: Nutrition Therapy & Goals - 12/22/19 1506      Nutrition Therapy   Diet  Low Sodium      Personal Nutrition Goals   Nutrition Goal  Pt to build a healthy plate including vegetables, fruits, whole grains, and low-fat dairy products in a heart healthy meal plan.    Personal Goal #2  Pt to learn to read labels and make appropriate food choices for heart health and CKD      Intervention Plan   Intervention  Prescribe, educate and counsel regarding individualized specific dietary modifications aiming towards targeted core components such as weight, hypertension, lipid management, diabetes, heart failure and other comorbidities.    Expected Outcomes  Short Term Goal: Understand basic principles of dietary content, such as calories, fat, sodium, cholesterol and nutrients.       Nutrition Discharge: Nutrition Assessments - 02/19/20 0809      MEDFICTS Scores   Post Score  27       Education Questionnaire Score: Knowledge Questionnaire Score - 02/13/20 1543      Knowledge Questionnaire Score   Post Score  22/24       Goals reviewed with patient; copy given to patient. Pt graduated from cardiac rehab program  today with completion of 22 exercise sessions in Phase II. Pt maintained good attendance and progressed nicely during his participation in rehab as evidenced by increased MET level.   Medication list reconciled. Repeat  PHQ score-  0.  Pt has made significant lifestyle changes and should be commended for her success. Pt feels she has achieved her goals during cardiac rehab.   Pt plans to continue exercise by walking. Zelia increased the distance on her post exercise walk test by 19 feet. Dhani maintained her current weight. We are proud of Francelia's progress. Misako said that she feels stronger after participating in phase 2 cardiac rehab.Barnet Pall, RN,BSN 02/24/2020 1:21 PM

## 2020-04-03 ENCOUNTER — Other Ambulatory Visit (HOSPITAL_COMMUNITY)
Admission: RE | Admit: 2020-04-03 | Discharge: 2020-04-03 | Disposition: A | Discharge status: Home / Self Care | Payer: Medicare Other | Source: Ambulatory Visit | Attending: Cardiology | Admitting: Cardiology

## 2020-04-03 DIAGNOSIS — Z01812 Encounter for preprocedural laboratory examination: Secondary | ICD-10-CM | POA: Diagnosis present

## 2020-04-03 DIAGNOSIS — Z20822 Contact with and (suspected) exposure to covid-19: Secondary | ICD-10-CM | POA: Insufficient documentation

## 2020-04-03 LAB — SARS CORONAVIRUS 2 (TAT 6-24 HRS): SARS Coronavirus 2: NEGATIVE

## 2020-04-05 ENCOUNTER — Other Ambulatory Visit: Payer: Self-pay

## 2020-04-05 ENCOUNTER — Ambulatory Visit (HOSPITAL_COMMUNITY)
Admission: RE | Admit: 2020-04-05 | Discharge: 2020-04-05 | Disposition: A | Discharge status: Home / Self Care | Payer: Medicare Other | Attending: Cardiology | Admitting: Cardiology

## 2020-04-05 ENCOUNTER — Ambulatory Visit (HOSPITAL_COMMUNITY)
Admission: RE | Disposition: A | Discharge status: Home / Self Care | Payer: Self-pay | Source: Home / Self Care | Attending: Cardiology

## 2020-04-05 ENCOUNTER — Ambulatory Visit (HOSPITAL_COMMUNITY): Payer: Medicare Other

## 2020-04-05 DIAGNOSIS — G35 Multiple sclerosis: Secondary | ICD-10-CM | POA: Diagnosis not present

## 2020-04-05 DIAGNOSIS — Z85038 Personal history of other malignant neoplasm of large intestine: Secondary | ICD-10-CM | POA: Insufficient documentation

## 2020-04-05 DIAGNOSIS — N184 Chronic kidney disease, stage 4 (severe): Secondary | ICD-10-CM | POA: Diagnosis not present

## 2020-04-05 DIAGNOSIS — Z006 Encounter for examination for normal comparison and control in clinical research program: Secondary | ICD-10-CM | POA: Insufficient documentation

## 2020-04-05 DIAGNOSIS — Z905 Acquired absence of kidney: Secondary | ICD-10-CM | POA: Diagnosis not present

## 2020-04-05 DIAGNOSIS — I5022 Chronic systolic (congestive) heart failure: Secondary | ICD-10-CM | POA: Diagnosis present

## 2020-04-05 DIAGNOSIS — I130 Hypertensive heart and chronic kidney disease with heart failure and stage 1 through stage 4 chronic kidney disease, or unspecified chronic kidney disease: Secondary | ICD-10-CM | POA: Insufficient documentation

## 2020-04-05 DIAGNOSIS — I428 Other cardiomyopathies: Secondary | ICD-10-CM | POA: Insufficient documentation

## 2020-04-05 DIAGNOSIS — I447 Left bundle-branch block, unspecified: Secondary | ICD-10-CM | POA: Insufficient documentation

## 2020-04-05 DIAGNOSIS — Z95818 Presence of other cardiac implants and grafts: Secondary | ICD-10-CM

## 2020-04-05 DIAGNOSIS — Z880 Allergy status to penicillin: Secondary | ICD-10-CM | POA: Diagnosis not present

## 2020-04-05 HISTORY — PX: BIV ICD INSERTION CRT-D: EP1195

## 2020-04-05 LAB — CBC
HCT: 34.1 % — ABNORMAL LOW (ref 36.0–46.0)
Hemoglobin: 10.6 g/dL — ABNORMAL LOW (ref 12.0–15.0)
MCH: 30.5 pg (ref 26.0–34.0)
MCHC: 31.1 g/dL (ref 30.0–36.0)
MCV: 98.3 fL (ref 80.0–100.0)
Platelets: 179 10*3/uL (ref 150–400)
RBC: 3.47 MIL/uL — ABNORMAL LOW (ref 3.87–5.11)
RDW: 13.7 % (ref 11.5–15.5)
WBC: 7.7 10*3/uL (ref 4.0–10.5)
nRBC: 0 % (ref 0.0–0.2)

## 2020-04-05 LAB — BASIC METABOLIC PANEL
Anion gap: 8 (ref 5–15)
BUN: 62 mg/dL — ABNORMAL HIGH (ref 8–23)
CO2: 20 mmol/L — ABNORMAL LOW (ref 22–32)
Calcium: 9.2 mg/dL (ref 8.9–10.3)
Chloride: 113 mmol/L — ABNORMAL HIGH (ref 98–111)
Creatinine, Ser: 2.33 mg/dL — ABNORMAL HIGH (ref 0.44–1.00)
GFR calc Af Amer: 24 mL/min — ABNORMAL LOW (ref >60)
GFR calc non Af Amer: 21 mL/min — ABNORMAL LOW (ref >60)
Glucose, Bld: 95 mg/dL (ref 70–99)
Potassium: 4.4 mmol/L (ref 3.5–5.1)
Sodium: 141 mmol/L (ref 135–145)

## 2020-04-05 SURGERY — BIV ICD INSERTION CRT-D

## 2020-04-05 MED ORDER — LIDOCAINE HCL (PF) 1 % IJ SOLN
INTRAMUSCULAR | Status: DC | PRN
  Administered 2020-04-05: 60 mL
  Administered 2020-04-05: 20 mL

## 2020-04-05 MED ORDER — SODIUM CHLORIDE 0.9 % IV SOLN: INTRAVENOUS | Status: DC

## 2020-04-05 MED ORDER — ACETAMINOPHEN 325 MG PO TABS
325.0000 mg | ORAL_TABLET | ORAL | Status: DC | PRN
  Administered 2020-04-05: 650 mg via ORAL
  Filled 2020-04-05: qty 2

## 2020-04-05 MED ORDER — LIDOCAINE HCL 1 % IJ SOLN
INTRAMUSCULAR | Status: AC
  Filled 2020-04-05: qty 20

## 2020-04-05 MED ORDER — SODIUM CHLORIDE 0.9 % IV SOLN
80.0000 mg | INTRAVENOUS | Status: AC
  Administered 2020-04-05: 80 mg
  Filled 2020-04-05: qty 2

## 2020-04-05 MED ORDER — MIDAZOLAM HCL 5 MG/5ML IJ SOLN
INTRAMUSCULAR | Status: DC | PRN
  Administered 2020-04-05 (×4): 1 mg via INTRAVENOUS

## 2020-04-05 MED ORDER — ONDANSETRON HCL 4 MG/2ML IJ SOLN: 4.0000 mg | Freq: Four times a day (QID) | INTRAMUSCULAR | Status: DC | PRN

## 2020-04-05 MED ORDER — HEPARIN (PORCINE) IN NACL 1000-0.9 UT/500ML-% IV SOLN
INTRAVENOUS | Status: AC
  Filled 2020-04-05: qty 500

## 2020-04-05 MED ORDER — HEPARIN (PORCINE) IN NACL 1000-0.9 UT/500ML-% IV SOLN
INTRAVENOUS | Status: DC | PRN
  Administered 2020-04-05: 500 mL

## 2020-04-05 MED ORDER — IOHEXOL 350 MG/ML SOLN
INTRAVENOUS | Status: DC | PRN
  Administered 2020-04-05: 15 mL

## 2020-04-05 MED ORDER — VANCOMYCIN HCL IN DEXTROSE 1-5 GM/200ML-% IV SOLN
INTRAVENOUS | Status: AC
  Filled 2020-04-05: qty 200

## 2020-04-05 MED ORDER — SODIUM CHLORIDE 0.9 % IV SOLN
INTRAVENOUS | Status: AC
  Filled 2020-04-05: qty 2

## 2020-04-05 MED ORDER — VANCOMYCIN HCL 1000 MG IV SOLR
INTRAVENOUS | Status: AC | PRN
  Administered 2020-04-05: 1000 mg via INTRAVENOUS

## 2020-04-05 MED ORDER — VANCOMYCIN HCL IN DEXTROSE 1-5 GM/200ML-% IV SOLN: 1000.0000 mg | INTRAVENOUS | Status: DC

## 2020-04-05 MED ORDER — MIDAZOLAM HCL 5 MG/5ML IJ SOLN
INTRAMUSCULAR | Status: AC
  Filled 2020-04-05: qty 5

## 2020-04-05 MED ORDER — FENTANYL CITRATE (PF) 100 MCG/2ML IJ SOLN
INTRAMUSCULAR | Status: DC | PRN
  Administered 2020-04-05: 12.5 ug via INTRAVENOUS
  Administered 2020-04-05: 25 ug via INTRAVENOUS
  Administered 2020-04-05: 12.5 ug via INTRAVENOUS
  Administered 2020-04-05: 25 ug via INTRAVENOUS

## 2020-04-05 MED ORDER — VANCOMYCIN HCL IN DEXTROSE 1-5 GM/200ML-% IV SOLN: 1000.0000 mg | Freq: Two times a day (BID) | INTRAVENOUS | Status: DC

## 2020-04-05 MED ORDER — LIDOCAINE HCL 1 % IJ SOLN
INTRAMUSCULAR | Status: AC
  Filled 2020-04-05: qty 60

## 2020-04-05 MED ORDER — FENTANYL CITRATE (PF) 100 MCG/2ML IJ SOLN
INTRAMUSCULAR | Status: AC
  Filled 2020-04-05: qty 2

## 2020-04-05 SURGICAL SUPPLY — 17 items
BALLN ATTAIN 80 (BALLOONS) ×2
BALLOON ATTAIN 80 (BALLOONS) ×1 IMPLANT
CABLE SURGICAL S-101-97-12 (CABLE) ×2 IMPLANT
CATH ATTAIN COM SURV 6250V-MB2 (CATHETERS) ×2 IMPLANT
CATH HEX JOSEPH 2-5-2 65CM 6F (CATHETERS) ×2 IMPLANT
ICD CLARIA MRI DTMA1QQ (ICD Generator) ×2 IMPLANT
LEAD ATTAIN PERFORM ST 4398-88 (Lead) ×2 IMPLANT
LEAD CAPSURE NOVUS 5076-52CM (Lead) ×2 IMPLANT
LEAD SPRINT QUAT SEC 6935M-62 (Lead) ×2 IMPLANT
PAD PRO RADIOLUCENT 2001M-C (PAD) ×2 IMPLANT
SHEATH 7FR PRELUDE SNAP 13 (SHEATH) ×2 IMPLANT
SHEATH 9.5FR PRELUDE SNAP 13 (SHEATH) ×2 IMPLANT
SHEATH 9FR PRELUDE SNAP 13 (SHEATH) ×2 IMPLANT
SLITTER 6232ADJ (MISCELLANEOUS) ×2 IMPLANT
TRAY PACEMAKER INSERTION (PACKS) ×2 IMPLANT
WIRE ACUITY WHISPER EDS 4648 (WIRE) ×2 IMPLANT
WIRE HI TORQ VERSACORE-J 145CM (WIRE) ×2 IMPLANT

## 2020-04-05 NOTE — Discharge Instructions (Signed)
Supplemental Discharge Instructions for  Pacemaker/Defibrillator Patients  Activity Do not raise your left/right arm above shoulder level or extend it backward beyond shoulder level for 2 weeks. Wear the arm sling as a reminder or as needed for comfort for 2 weeks. No heavy lifting or vigorous activity with your left/right arm for 6-8 weeks.    NO DRIVING is preferable for 2 weeks; If absolutely necessary, drive only short, familiar routes. DO wear your seatbelt, even if it crosses over the pacemaker site.  WOUND CARE - Keep the wound area clean and dry.  Remove the dressing the day after you return home (usually 48 hours after the procedure). - DO NOT SUBMERGE UNDER WATER UNTIL FULLY HEALED (no tub baths, hot tubs, swimming pools, etc.).  - You  may shower or take a sponge bath after the dressing is removed. DO NOT SOAK the area and do not allow the shower to directly spray on the site. - If you have staples, these will be removed in the office in 7-14 days. - If you have tape/steri-strips on your wound, these will fall off; do not pull them off prematurely.   - No bandage is needed on the site.  DO  NOT apply any creams, oils, or ointments to the wound area. - If you notice any drainage or discharge from the wound, any swelling, excessive redness or bruising at the site, or if you develop a fever > 101? F after you are discharged home, call the office at once.  Special Instructions - You are still able to use cellular telephones.  Avoid carrying your cellular phone near your device. - When traveling through airports, show security personnel your identification card to avoid being screened in the metal detectors.  - Avoid arc welding equipment, MRI testing (magnetic resonance imaging), TENS units (transcutaneous nerve stimulators).  Call the office for questions about other devices. - Avoid electrical appliances that are in poor condition or are not properly grounded. - Microwave ovens are  safe to be near or to operate.  Additional information for defibrillator patients should your device go off: - If your device goes off ONCE and you feel fine afterward, notify the clinic at [phone]. - If your device goes off ONCE and you do not feel well afterward, call 911. - If your device goes off TWICE or more in one day, call 911.  DO NOT DRIVE YOURSELF OR A FAMILY MEMBER WITH A DEFIBRILLATOR TO THE HOSPITAL--CALL 911.Cardioverter Defibrillator Implantation, Care After This sheet gives you information about how to care for yourself after your procedure. Your health care provider may also give you more specific instructions. If you have problems or questions, contact your health care provider. What can I expect after the procedure? After the procedure, it is common to have:  Some pain. It may last a few days.  A slight bump over the skin where the device was placed. Sometimes, it is possible to feel the device under the skin. This is normal. During the months and years after your procedure, your health care provider will check the device, the leads, and the battery every few months. Eventually, when the battery is low, the device will be replaced. Follow these instructions at home: Medicines  Take over-the-counter and prescription medicines only as told by your health care provider.  If you were prescribed an antibiotic medicine, take it as told by your health care provider. Do not stop taking the antibiotic even if you start to feel better. Incision  care      Follow instructions from your health care provider about how to take care of your incision area. Make sure you: ? Wash your hands with soap and water before you change your bandage (dressing). If soap and water are not available, use hand sanitizer. ? Change your dressing as told by your health care provider. ? Leave stitches (sutures), skin glue, or adhesive strips in place. These skin closures may need to stay in  place for 2 weeks or longer. If adhesive strip edges start to loosen and curl up, you may trim the loose edges. Do not remove adhesive strips completely unless your health care provider tells you to do that.  Check your incision area every day for signs of infection. Check for: ? More redness, swelling, or pain. ? More fluid or blood. ? Warmth. ? Pus or a bad smell.  Do not use lotions or ointments near the incision area unless told by your health care provider.  Keep the incision area clean and dry for 2-3 days after the procedure or for as long as told by your health care provider. It takes several weeks for the incision site to heal completely.  Do not take baths, swim, or use a hot tub until your health care provider approves. Activity  Try to walk a little every day. Exercising is important after this procedure. Also, use your shoulder on the side of the defibrillator in daily tasks that do not require a lot of motion.  For at least 6 weeks: ? Do not lift your upper arm above your shoulders. This means no tennis, golf, or swimming for this period of time. If you tend to sleep with your arm above your head, use a restraint to prevent this during sleep. ? Avoid sudden jerking, pulling, or chopping movements that pull your upper arm far away from your body.  Ask your health care provider when you may go back to work.  Check with your health care provider before you start to drive or play sports. Electric and magnetic fields  Tell all health care providers that you have a defibrillator. This may prevent them from giving you an MRI scan because strong magnets are used for that test.  If you must pass through a metal detector, quickly walk through it. Do not stop under the detector, and do not stand near it.  Avoid places or objects that have a strong electric or magnetic field, including: ? Airport [employer]. At the airport, let officials know that you have a defibrillator. Your  defibrillator ID card will let you be checked in a way that is safe for you and will not damage your defibrillator. Also, do not let a security person wave a magnetic wand near your defibrillator. That can make it stop working. ? Power plants. ? Large electrical generators. ? Anti-theft systems or electronic article surveillance (EAS). ? Radiofrequency transmission towers, such as cell phone and radio towers.  Do not use amateur (ham) radio equipment or electric (arc) welding torches. Some devices are safe to use if held at least 12 inches (30 cm) from your defibrillator. These include power tools, lawn mowers, and speakers. If you are unsure if something is safe to use, ask your health care provider.  Do not use MP3 player headphones. They have magnets.  You may safely use electric blankets, heating pads, computers, and microwave ovens.  When using your cell phone, hold it to the ear that is on the opposite side  from the defibrillator. Do not leave your cell phone in a pocket over the defibrillator. General instructions  Follow diet instructions from your health care provider, if this applies.  Always keep your defibrillator ID card with you. The card should list the implant date, device model, and manufacturer. Consider wearing a medical alert bracelet or necklace.  Have your defibrillator checked every 3-6 months or as often as told by your health care provider. Most defibrillators last for 4-8 years.  Keep all follow-up visits as told by your health care provider. This is important for your health care provider to make sure your chest is healing the way it should. Ask your health care provider when you should come back to have your stitches or staples taken out. Contact a health care provider if:  You feel one shock in your chest.  You gain weight suddenly.  Your legs or feet swell more than they have before.  It feels like your heart is fluttering or skipping beats (heart  palpitations).  You have more redness, swelling, or pain around your incision.  You have more fluid or blood coming from your incision.  Your incision feels warm to the touch.  You have pus or a bad smell coming from your incision.  You have a fever. Get help right away if:  You have chest pain.  You feel more than one shock.  You feel more short of breath than you have felt before.  You feel more light-headed than you have felt before.  Your incision starts to open up. This information is not intended to replace advice given to you by your health care provider. Make sure you discuss any questions you have with your health care provider. Document Revised: 12/27/2017 Document Reviewed: 03/08/2016 Elsevier Patient Education  [place].

## 2020-04-05 NOTE — H&P (Signed)
Electrophysiology Office Note   Date:  04/05/2020   ID:  Jeanne Bruce, DOB [birth year], MRN [id number]  PCP:  Derrill Center., MD  Cardiologist:  Aundra Dubin Primary Electrophysiologist:  Jeanne Hehn Meredith Leeds, MD    Chief Complaint: CHF   History of Present Illness: Jeanne Bruce is a 64 y.o. female who is being seen today for the evaluation of CHF at the request of No ref. provider found. Presenting today for electrophysiology evaluation.  She has a history significant for stage IV CKD, chronic systolic heart failure due to nonischemic cardiomyopathy, multiple sclerosis, and right nephrectomy.  She also has colon cancer and has had a colostomy.  She has frequent UTIs and was admitted October 2020 with urosepsis.  She had respiratory arrest and VF arrest in the emergency room and was intubated.  She had an echo done that showed an ejection fraction of 25% with apical ballooning.  Left heart catheterization showed no significant coronary artery disease.  She had a right nephrectomy for renal abscess during that admission.  Echo December 2020 showed a persistently low ejection fraction of 25 to 30% with periapical akinesis.  She did not tolerate cardiac meds due to orthostatic symptoms.  Today, she is on both carvedilol and Aldactone.  Unfortunately her ejection fraction has remained low.  Today, denies symptoms of palpitations, chest pain, shortness of breath, orthopnea, PND, lower extremity edema, claudication, dizziness, presyncope, syncope, bleeding, or neurologic sequela. The patient is tolerating medications without difficulties. CRT-D implant today.    Past Medical History:  Diagnosis Date   Cancer ([place])    colon (resolved)   Hypertension    Multiple sclerosis ([place])    Renal disorder    Past Surgical History:  Procedure Laterality Date   LEFT HEART CATH AND CORONARY ANGIOGRAPHY N/A 08/14/2019   Procedure: LEFT HEART CATH AND CORONARY ANGIOGRAPHY;  Surgeon: Burnell Blanks, MD;  Location: [facility];  Service: Cardiovascular;  Laterality: N/A;   NEPHRECTOMY TRANSPLANTED ORGAN       Current Facility-Administered Medications  Medication Dose Route Frequency Provider Last Rate Last Admin   0.9 %  sodium chloride infusion   Intravenous Continuous Analei Whinery Hassell Done, MD       gentamicin (GARAMYCIN) 80 mg in sodium chloride 0.9 % 500 mL irrigation  80 mg Irrigation On Call Kaiyana Bedore Hassell Done, MD       vancomycin (VANCOCIN) IVPB 1000 mg/200 mL premix  1,000 mg Intravenous On Call Constance Haw, MD        Allergies:   Ativan [lorazepam], Invanz [ertapenem], Mirabegron, Claritin-d 12 hour [loratadine-pseudoephedrine er], and Penicillins   Social History:  The patient  reports that she has never smoked. She has never used smokeless tobacco. She reports previous alcohol use. She reports that she does not use drugs.   Family History:  The patient's family history includes Atrial fibrillation in her father.    ROS:  Please see the history of present illness.   Otherwise, review of systems is positive for none.   All other systems are reviewed and negative.    PHYSICAL EXAM: VS:  BP (!) 139/50    Pulse 64    Temp 98.1 F (36.7 C) (Skin)    Resp 14    Ht 5' (1.524 m)    Wt 63.5 kg    SpO2 100%    BMI 27.34 kg/m  , BMI Body mass index is 27.34 kg/m. GEN: Well nourished, well developed, in no  acute distress  HEENT: normal  Neck: no JVD, carotid bruits, or masses Cardiac: RRR; no murmurs, rubs, or gallops,no edema  Respiratory:  clear to auscultation bilaterally, normal work of breathing GI: soft, nontender, nondistended, + BS MS: no deformity or atrophy  Skin: warm and dry Neuro:  Strength and sensation are intact Psych: euthymic mood, full affect   Recent Labs: 08/12/2019: Magnesium 2.3 08/13/2019: ALT 27 08/16/2019: Hemoglobin 9.0; Platelets 237 11/07/2019: B Natriuretic Peptide 53.8 02/10/2020: BUN 55; Creatinine, Ser 2.35;  Potassium 5.1; Sodium 139    Lipid Panel     Component Value Date/Time   CHOL 160 08/11/2019 0830   TRIG 165 (H) 08/11/2019 0830   HDL 35 (L) 08/11/2019 0830   CHOLHDL 4.6 08/11/2019 0830   VLDL 33 08/11/2019 0830   LDLCALC 92 08/11/2019 0830     Wt Readings from Last 3 Encounters:  04/05/20 63.5 kg  02/12/20 60.3 kg  02/10/20 59.9 kg      Other studies Reviewed: Additional studies/ records that were reviewed today include: TTE 01/20/20  Review of the above records today demonstrates:  1. Left ventricular ejection fraction, by estimation, is 25%. The left  ventricle has severely decreased function. The left ventricle demonstrates  global hypokinesis with septal-lateral dyssynchrony consistent with LBBB.  Left ventricular diastolic  parameters are consistent with Grade I diastolic dysfunction (impaired  relaxation).  2. Right ventricular systolic function is normal. The right ventricular  size is normal. Tricuspid regurgitation signal is inadequate for assessing  PA pressure.  3. Left atrial size was mildly dilated.  4. The mitral valve is normal in structure. Trivial mitral valve  regurgitation. No evidence of mitral stenosis.  5. The aortic valve is tricuspid. Aortic valve regurgitation is trivial.  No aortic stenosis is present.  6. The inferior vena cava is normal in size with <50% respiratory  variability, suggesting right atrial pressure of 8 mmHg.    ASSESSMENT AND PLAN:  1.  Chronic systolic heart failure due to nonischemic cardiomyopathy: Plan for ICD implant.  2.  CKD stage IV: Had a nephrectomy October 2020 at [facility].  Follows with their nephrology.   ICD Criteria  Current LVEF:25-30%. Within 12 months prior to implant: Yes   Heart failure history: Yes, Class II  Cardiomyopathy history: Yes, Non-Ischemic Cardiomyopathy.  Atrial Fibrillation/Atrial Flutter: No.  Ventricular tachycardia history: No.  Cardiac arrest history: No.  History of  syndromes with risk of sudden death: No.  Previous ICD: No.  Current ICD indication: Primary  PPM indication: No.  Class I or II Bradycardia indication present: Yes  Beta Blocker therapy for 3 or more months: Yes, prescribed.   Ace Inhibitor/ARB therapy for 3 or more months: No, medical reason.   I have seen Nashae A Gassen is a 10 y.o. femalepre-procedural and has been referred by Aundra Dubin for consideration of ICD implant for primary prevention of sudden death.  The patient's chart has been reviewed and they meet criteria for ICD implant.  I have had a thorough discussion with the patient reviewing options.  The patient and their family (if available) have had opportunities to ask questions and have them answered. The patient and I have decided together through the [place] [employer] to implant ICD at this time.  Risks, benefits, alternatives to ICD implantation were discussed in detail with the patient today. The patient  understands that the risks include but are not limited to bleeding, infection, pneumothorax, perforation, tamponade, vascular damage, renal failure,  MI, stroke, death, inappropriate shocks, and lead dislodgement and  wishes to proceed.

## 2020-04-05 NOTE — Progress Notes (Signed)
PHARMACY NOTE:  ANTIMICROBIAL RENAL DOSAGE ADJUSTMENT  Current antimicrobial regimen includes a mismatch between antimicrobial dosage and estimated renal function.  As per policy approved by the Pharmacy & Therapeutics and Medical Executive Committees, the antimicrobial dosage will be adjusted accordingly.  Current antimicrobial dosage:    Vancomycin 1 gm IV q12hrs post-procedure  Indication: surgical prophylaxis  Renal Function:  Estimated Creatinine Clearance: 19.8 mL/min (A) (by C-G formula based on SCr of 2.33 mg/dL (H)). []      On intermittent HD, scheduled: []      On CRRT    Antimicrobial dosage has been changed to:   post-procedure dose cancelled  Additional comments:  Pre-procedure Vancomycin 1 gm IV dose will provide ~24 hr coverage  Thank you for allowing pharmacy to be a part of this patient's care.  Arty Baumgartner, [facility] 04/05/2020 4:25 PM

## 2020-04-05 NOTE — Progress Notes (Signed)
Ok to d/c home per Dr Curt Bears

## 2020-04-06 ENCOUNTER — Encounter (HOSPITAL_COMMUNITY): Payer: Self-pay | Admitting: Cardiology

## 2020-04-06 MED FILL — Lidocaine HCl Local Inj 1%: INTRAMUSCULAR | Qty: 80 | Status: AC

## 2020-04-20 ENCOUNTER — Ambulatory Visit (INDEPENDENT_AMBULATORY_CARE_PROVIDER_SITE_OTHER): Payer: Medicare Other | Admitting: Emergency Medicine

## 2020-04-20 ENCOUNTER — Other Ambulatory Visit: Payer: Self-pay

## 2020-04-20 DIAGNOSIS — I420 Dilated cardiomyopathy: Secondary | ICD-10-CM

## 2020-04-20 DIAGNOSIS — I5022 Chronic systolic (congestive) heart failure: Secondary | ICD-10-CM

## 2020-04-20 LAB — CUP PACEART INCLINIC DEVICE CHECK
Battery Remaining Longevity: 112 mo
Battery Voltage: 3.16 V
Brady Statistic AP VP Percent: 0.01 %
Brady Statistic AP VS Percent: 0 %
Brady Statistic AS VP Percent: 96.79 %
Brady Statistic AS VS Percent: 3.2 %
Brady Statistic RA Percent Paced: 0.01 %
Brady Statistic RV Percent Paced: 10.5 %
Date Time Interrogation Session: 20210706091256
HighPow Impedance: 50 Ohm
Implantable Lead Implant Date: 20210621
Implantable Lead Implant Date: 20210621
Implantable Lead Implant Date: 20210621
Implantable Lead Location: 753858
Implantable Lead Location: 753859
Implantable Lead Location: 753860
Implantable Lead Model: 4398
Implantable Lead Model: 5076
Implantable Pulse Generator Implant Date: 20210621
Lead Channel Impedance Value: 141.867
Lead Channel Impedance Value: 141.867
Lead Channel Impedance Value: 141.867
Lead Channel Impedance Value: 141.867
Lead Channel Impedance Value: 152 Ohm
Lead Channel Impedance Value: 247 Ohm
Lead Channel Impedance Value: 266 Ohm
Lead Channel Impedance Value: 266 Ohm
Lead Channel Impedance Value: 304 Ohm
Lead Channel Impedance Value: 304 Ohm
Lead Channel Impedance Value: 304 Ohm
Lead Channel Impedance Value: 323 Ohm
Lead Channel Impedance Value: 342 Ohm
Lead Channel Impedance Value: 456 Ohm
Lead Channel Impedance Value: 456 Ohm
Lead Channel Impedance Value: 494 Ohm
Lead Channel Impedance Value: 494 Ohm
Lead Channel Impedance Value: 513 Ohm
Lead Channel Pacing Threshold Amplitude: 0.5 V
Lead Channel Pacing Threshold Amplitude: 0.625 V
Lead Channel Pacing Threshold Pulse Width: 0.4 ms
Lead Channel Pacing Threshold Pulse Width: 0.4 ms
Lead Channel Sensing Intrinsic Amplitude: 2 mV
Lead Channel Sensing Intrinsic Amplitude: 2.25 mV
Lead Channel Sensing Intrinsic Amplitude: 7.875 mV
Lead Channel Setting Pacing Amplitude: 1.5 V
Lead Channel Setting Pacing Amplitude: 3.5 V
Lead Channel Setting Pacing Amplitude: 3.5 V
Lead Channel Setting Pacing Pulse Width: 0.4 ms
Lead Channel Setting Pacing Pulse Width: 0.4 ms
Lead Channel Setting Sensing Sensitivity: 0.3 mV

## 2020-04-20 NOTE — Progress Notes (Signed)
Wound check appointment. Steri-strips removed. Wound without redness or edema. Incision edges approximated, wound well healed.  Device check completed with industry. Normal device function. Thresholds, sensing, and impedances consistent with implant measurements. Effective VP 95.8%  Device programmed at 3.5V for extra safety margin until 3 month visit. Histogram distribution appropriate for patient and level of activity. No mode switches or ventricular arrhythmias noted.  PMT intervention programmed on.  Patient educated about wound care, arm mobility, lifting restrictions, shock plan. ROV 07/12/20 with Dr. Curt Bears.  Next remote check scheduled for 07/05/20.

## 2020-04-21 ENCOUNTER — Telehealth (HOSPITAL_COMMUNITY): Payer: Self-pay | Admitting: *Deleted

## 2020-04-21 NOTE — Telephone Encounter (Signed)
Pt called stating bp is 107/59. Pt states that is low for her and it was low yesterday. Pt said she doesn't feel right but cant describe how she feels. Pt denies chest pain, cough, dizziness, fatigue, shortness of breath. Pt has an appointment Monday with our office.  Pt advised to contact our office if bp drops or she develops symptoms. Will follow up with Dr.McLean for advice.

## 2020-04-22 NOTE — Telephone Encounter (Signed)
That BP should be ok.  Will see her in office.

## 2020-04-26 ENCOUNTER — Other Ambulatory Visit: Payer: Self-pay

## 2020-04-26 ENCOUNTER — Ambulatory Visit (HOSPITAL_COMMUNITY)
Admission: RE | Admit: 2020-04-26 | Discharge: 2020-04-26 | Disposition: A | Discharge status: Home / Self Care | Payer: Medicare Other | Source: Ambulatory Visit | Attending: Cardiology | Admitting: Cardiology

## 2020-04-26 ENCOUNTER — Telehealth (HOSPITAL_COMMUNITY): Payer: Self-pay

## 2020-04-26 ENCOUNTER — Encounter (HOSPITAL_COMMUNITY): Payer: Self-pay | Admitting: Cardiology

## 2020-04-26 VITALS — BP 106/74 | HR 80 | Wt 144.0 lb

## 2020-04-26 DIAGNOSIS — Z79899 Other long term (current) drug therapy: Secondary | ICD-10-CM | POA: Insufficient documentation

## 2020-04-26 DIAGNOSIS — Z87440 Personal history of urinary (tract) infections: Secondary | ICD-10-CM | POA: Insufficient documentation

## 2020-04-26 DIAGNOSIS — I428 Other cardiomyopathies: Secondary | ICD-10-CM | POA: Insufficient documentation

## 2020-04-26 DIAGNOSIS — I480 Paroxysmal atrial fibrillation: Secondary | ICD-10-CM | POA: Diagnosis not present

## 2020-04-26 DIAGNOSIS — I447 Left bundle-branch block, unspecified: Secondary | ICD-10-CM | POA: Insufficient documentation

## 2020-04-26 DIAGNOSIS — G35 Multiple sclerosis: Secondary | ICD-10-CM | POA: Insufficient documentation

## 2020-04-26 DIAGNOSIS — N184 Chronic kidney disease, stage 4 (severe): Secondary | ICD-10-CM | POA: Insufficient documentation

## 2020-04-26 DIAGNOSIS — I130 Hypertensive heart and chronic kidney disease with heart failure and stage 1 through stage 4 chronic kidney disease, or unspecified chronic kidney disease: Secondary | ICD-10-CM | POA: Diagnosis not present

## 2020-04-26 DIAGNOSIS — Z79890 Hormone replacement therapy (postmenopausal): Secondary | ICD-10-CM | POA: Insufficient documentation

## 2020-04-26 DIAGNOSIS — Z7901 Long term (current) use of anticoagulants: Secondary | ICD-10-CM | POA: Insufficient documentation

## 2020-04-26 DIAGNOSIS — Z8249 Family history of ischemic heart disease and other diseases of the circulatory system: Secondary | ICD-10-CM | POA: Insufficient documentation

## 2020-04-26 DIAGNOSIS — Z85038 Personal history of other malignant neoplasm of large intestine: Secondary | ICD-10-CM | POA: Diagnosis not present

## 2020-04-26 DIAGNOSIS — Z905 Acquired absence of kidney: Secondary | ICD-10-CM | POA: Diagnosis not present

## 2020-04-26 DIAGNOSIS — Z933 Colostomy status: Secondary | ICD-10-CM | POA: Diagnosis not present

## 2020-04-26 DIAGNOSIS — I5022 Chronic systolic (congestive) heart failure: Secondary | ICD-10-CM | POA: Diagnosis not present

## 2020-04-26 LAB — BASIC METABOLIC PANEL
Anion gap: 9 (ref 5–15)
BUN: 64 mg/dL — ABNORMAL HIGH (ref 8–23)
CO2: 19 mmol/L — ABNORMAL LOW (ref 22–32)
Calcium: 9.1 mg/dL (ref 8.9–10.3)
Chloride: 112 mmol/L — ABNORMAL HIGH (ref 98–111)
Creatinine, Ser: 2.61 mg/dL — ABNORMAL HIGH (ref 0.44–1.00)
GFR calc Af Amer: 21 mL/min — ABNORMAL LOW (ref >60)
GFR calc non Af Amer: 18 mL/min — ABNORMAL LOW (ref >60)
Glucose, Bld: 96 mg/dL (ref 70–99)
Potassium: 5.7 mmol/L — ABNORMAL HIGH (ref 3.5–5.1)
Sodium: 140 mmol/L (ref 135–145)

## 2020-04-26 MED ORDER — APIXABAN 5 MG PO TABS
5.0000 mg | ORAL_TABLET | Freq: Two times a day (BID) | ORAL | 11 refills | Status: DC
Start: 2020-04-26 — End: 2021-05-04

## 2020-04-26 MED ORDER — SPIRONOLACTONE 25 MG PO TABS: 25.0000 mg | ORAL_TABLET | Freq: Every day | ORAL | 3 refills | Status: DC

## 2020-04-26 NOTE — Patient Instructions (Addendum)
INCREASE Spironolactone to 25mg ,one tab daily START Eliquis 5 mg, one tab twice daily  Labs today We will only contact you if something comes back abnormal or we need to make some changes. Otherwise no news is good news!  Labs needed in 7-10 days  Your physician recommends that you schedule a follow-up appointment in: 2 months with Dr Aundra Dubin or  in the [facility] ([facility]    At the [facility], you and your health needs are our priority. As part of our continuing mission to provide you with exceptional heart care, we have created designated Provider Care Teams. These Care Teams include your primary Cardiologist (physician) and Advanced Practice Providers (APPs- Physician Assistants and Nurse Practitioners) who all work together to provide you with the care you need, when you need it.   You may see any of the following providers on your designated Care Team at your next follow up: Marland Kitchen Dr Glori Bickers . Dr Loralie Champagne . Darrick Grinder, NP . Lyda Jester, PA . Audry Riles, PharmD   Please be sure to bring in all your medications bottles to every appointment.   Do the following things EVERYDAY: 1) Weigh yourself in the morning before breakfast. Write it down and keep it in a log. 2) Take your medicines as prescribed 3) Eat low salt foods--Limit salt (sodium) to 2000 mg per day.  4) Stay as active as you can everyday 5) Limit all fluids for the day to less than 2 liters

## 2020-04-26 NOTE — Progress Notes (Signed)
Jeanne Bruce, DOB 1956-01-21, MRN [id number]   Provider location: [facility] Type of Visit: Established patient   PCP:  Derrill Center., MD  Cardiologist:  Fransico Him, MD HF Cardiolgoy: Dr Aundra Dubin   History of Present Illness: Jeanne Bruce is a 64 y.o. female who has a history of CKD stage 4, chronic systolic CHF, multiple sclerosis, and right nephrectomy.  She was referred by Dr. Radford Pax for CHF evaluation.  Patient also had remote colon cancer with colostomy.  She has had frequent UTIs and was admitted in 10/20 with urosepsis.  She had respiratory arrest and VF arrest in the ER and was intubated.  Echo was done, showing EF 25% with apical ballooning.  LHC showed no significant coronary disease.  She ended up having right nephrectomy for renal abscess in 10/20.    Echo was repeated in 12/20, showing EF still low at 25-30% with peri-apical akinesis.   She has had a hard time taking cardiac medications due to orthostatic symptoms with low BP.  She was sent home from the hospital in 10/20 on hydralazine/Imdur but had to stop due to low BP.    Echo in 4/21 showed EF 25% with septal-lateral dyssynchrony, normal RV.  Medtronic CRT-D device was placed in 6/21.    She returns for followup of CHF. Her weight is up.  She is eating more and says that she had lost a lot of weight when she felt "sick."  She feels like she is at her baseline in terms of weight.  She has had the BiV device for about 3 wks now.  She has not noticed much difference yet.  She has significant exertional fatigue.  No dyspnea walking on flat ground.  No chest pain.  No orthopnea/PND.  On device interrogation today, she was noted to have a prolonged episode of atrial fibrillation. She is in NSR today.  She has not felt palpitations, but on the day she had atrial fibrillation, she had a lot of fatigue.   Medtronic device interrogation: > 99% BiV pacing, 10 hr run of atrial fibrillation noted.   Labs (11/20):  creatinine 1.5 Labs (12/20): creatinine 2.47 Labs (1/21): creatinine 2.1 => 2.04, BNP 54 Labs (6/21): creatinine 2.33 Labs (7/21): K 5.7, creatinine 2.61  ECG (personally reviewed): NSR, BiV paced  PMH: 1. H/o right nephrectomy for renal abscess in 10/20.  2. Multiple Sclerosis 3. HTN 4. H/o colon cancer: s/p colectomy with colostomy.  5. CKD: Stage 4. Sees a nephrologist at [facility].  6. Frequent UTIs.  7. Chronic systolic CHF: Nonischemic cardiomyopathy. Medtronic CRT-D device.  - Echo (10/20): EF 25%, apical ballooning.  - LHC (10/20): Normal coronaries.  - Echo (12/20): EF 25-30%, peri-apical akinesis, normal RV size and systolic function (similar to 10/20 echo).   - Echo (4/21): EF 25%, septal-lateral dyssynchrony 8. LBBB  Social History   Socioeconomic History  . Marital status: Married    Spouse name: Not on file  . Number of children: Not on file  . Years of education: Not on file  . Highest education level: Not on file  Occupational History  . Not on file  Tobacco Use  . Smoking status: Never Smoker  . Smokeless tobacco: Never Used  Substance and Sexual Activity  . Alcohol use: Not Currently  . Drug use: Never  . Sexual activity: Not on file  Other Topics Concern  . Not on file  Social History Narrative  . Not on file  Social Determinants of Health   Financial Resource Strain:   . Difficulty of Paying Living Expenses:   Food Insecurity:   . Worried About Charity fundraiser in the Last Year:   . Arboriculturist in the Last Year:   Transportation Needs: No Transportation Needs  . Lack of Transportation (Medical): No  . Lack of Transportation (Non-Medical): No  Physical Activity: Insufficiently Active  . Days of Exercise per Week: 2 days  . Minutes of Exercise per Session: 10 min  Stress:   . Feeling of Stress :   Social Connections: Unknown  . Frequency of Communication with Friends and Family: More than three times a week  . Frequency of Social  Gatherings with Friends and Family: Twice a week  . Attends Religious Services: More than 4 times per year  . Active Member of Clubs or Organizations: Not on file  . Attends Archivist Meetings: Not on file  . Marital Status: Married  Human resources officer Violence:   . Fear of Current or Ex-Partner:   . Emotionally Abused:   Marland Kitchen Physically Abused:   . Sexually Abused:    Family History  Problem Relation Age of Onset  . Atrial fibrillation Father   . Diabetes Mellitus II Neg Hx    ROS: All systems reviewed and negative except as per HPI.   Current Outpatient Medications  Medication Sig Dispense Refill  . carvedilol (COREG) 6.25 MG tablet Take 1 tablet (6.25 mg total) by mouth 2 (two) times daily. 60 tablet 3  . cholecalciferol (VITAMIN D3) 25 MCG (1000 UT) tablet Take 1,000 Units by mouth in the morning and at bedtime.     . conjugated estrogens (PREMARIN) vaginal cream Place 1 Applicatorful vaginally at bedtime.    . Cyanocobalamin (VITAMIN B12 PO) Take 1 tablet by mouth daily.     . magnesium oxide (MAG-OX) 400 MG tablet Take 400 mg by mouth daily.    Marland Kitchen omeprazole (PRILOSEC) 20 MG capsule Take 20 mg by mouth 2 (two) times daily.    . ondansetron (ZOFRAN) 4 MG tablet Take 4 mg by mouth every 8 (eight) hours as needed for nausea or vomiting.    . Probiotic Product (PROBIOTIC DAILY PO) Take 1 capsule by mouth daily.    Marland Kitchen UNABLE TO FIND Core Power nutritional supplement.    Marland Kitchen apixaban (ELIQUIS) 5 MG TABS tablet Take 1 tablet (5 mg total) by mouth 2 (two) times daily. 60 tablet 11   No current facility-administered medications for this encounter.   Exam:   BP 106/74   Pulse 80   Wt 65.3 kg (144 lb)   SpO2 99%   BMI 28.12 kg/m  General: NAD Neck: No JVD, no thyromegaly or thyroid nodule.  Lungs: Clear to auscultation bilaterally with normal respiratory effort. CV: Nondisplaced PMI.  Heart regular S1/S2, no S3/S4, no murmur.  No peripheral edema.  No carotid bruit.  Normal  pedal pulses.  Abdomen: Soft, nontender, no hepatosplenomegaly, no distention.  Skin: Intact without lesions or rashes.  Neurologic: Alert and oriented x 3.  Psych: Normal affect. Extremities: No clubbing or cyanosis.  HEENT: Normal.   Assessment/Plan:  1. Chronic systolic CHF: Nonischemic cardiomyopathy, LHC in 10/20 with no significant coronary disease.  Echo in 10/20 was suggestive of stress (Takotsubo-type) cardiomyopathy with apical ballooning (from severe urosepsis).  However, LV function did not improve on repeat echo in 12/20 or echo in 4/21 (EF still 25%).  ?LBBB cardiomyopathy.  She is  now s/p Medtronic CRT-D device.  NYHA class II symptoms.  She has had a hard time tolerating cardiac meds due to orthostasis, but this has been improved recently.  K noted to be high today at 5.7 and creatinine higher at 2.6.  - She will need to stop spironolactone and follow low K diet, repeat BMET 1 week.   - Continue Coreg 6.25 mg bid.  - No ACEI/ARNI/ARB for now with CKD stage 4.  - She does not appear to need Lasix.  2. CKD stage 4: S/p right nephrectomy in 10/20.  Sees nephrology at [facility]. Creatinine up to 2.6 on BMET today.  - Stop spironolactone.  3. Atrial fibrillation: Paroxysmal.  Noted after device implantation.  She is in NSR today.  - Start apixaban 5 mg bid.  - Follow device for atrial fibrillation burden.  May need to consider ablation or antiarrhythmic in the future.  On device interrogation, BiV pacing percentage significantly dropped when she went into atrial fibrillation.   Recommended follow-up: 2 months.    Signed, Loralie Champagne, MD  04/26/2020  [facility] [address] [facility] [place] [address] [phone] (office) [phone] (fax)

## 2020-04-26 NOTE — Telephone Encounter (Signed)
-----   Message from Larey Dresser, MD sent at 04/26/2020  4:30 PM EDT ----- Call asap.  K is high, do not increase spironolactone.  In fact, for now, would stop spironolactone.  BMET in 1 week.

## 2020-04-26 NOTE — Telephone Encounter (Signed)
Patient advised and verbalized understanding. Med list updated to reflect changes. Patient was given a Rx in office to have her labs done locally. They will fax results once complete.

## 2020-05-10 ENCOUNTER — Telehealth: Payer: Self-pay

## 2020-05-10 NOTE — Telephone Encounter (Signed)
Spoke with pt, she reports pretty much since device implanted she has been symptomatic, although upon discussion it seems the symptoms have been worse within the last week or so.  Symptoms include increased fatigue, feeling like she doesn't have energy to do anything. Chest soreness, not exactly pain but chest is sore.  Patient denies any increased SOB or Edema.  She reports she received an alert on her PM last night around 1730 as she was getting out of the car, per her report it sounded like an amber alert.  Pt sent in transmission which does not show that an alert occurred or why.    Transmission does show last episode of AF on 7/18 Current AF burden 1.7%.  Pt not currently in AF.  She was started on Eliquis by Dr. Aundra Dubin on 04/26/20.   Given that pt symptomatic, referring to AF clinic.

## 2020-05-10 NOTE — Telephone Encounter (Signed)
The pt states she does not feel well and got an alert from her device. The device beeped. The pt had a fever last night. Her chest is sore but not in pain. I had the pt to send a transmission. Transmission received.

## 2020-05-11 ENCOUNTER — Encounter (HOSPITAL_COMMUNITY): Payer: Self-pay

## 2020-05-12 NOTE — Telephone Encounter (Signed)
Patient returned my call.  She is aware of appt 05/14/20.

## 2020-05-12 NOTE — Telephone Encounter (Signed)
Called and left message for patient to call back to schedule appt. 

## 2020-05-14 ENCOUNTER — Other Ambulatory Visit: Payer: Self-pay

## 2020-05-14 ENCOUNTER — Encounter (HOSPITAL_COMMUNITY): Payer: Self-pay | Admitting: Physician Assistant

## 2020-05-14 ENCOUNTER — Ambulatory Visit (HOSPITAL_COMMUNITY)
Admission: RE | Admit: 2020-05-14 | Discharge: 2020-05-14 | Disposition: A | Discharge status: Home / Self Care | Payer: Medicare Other | Source: Ambulatory Visit | Attending: Physician Assistant | Admitting: Physician Assistant

## 2020-05-14 VITALS — BP 116/60 | HR 76 | Ht 60.0 in | Wt 149.0 lb

## 2020-05-14 DIAGNOSIS — Z905 Acquired absence of kidney: Secondary | ICD-10-CM | POA: Diagnosis not present

## 2020-05-14 DIAGNOSIS — I480 Paroxysmal atrial fibrillation: Secondary | ICD-10-CM | POA: Diagnosis present

## 2020-05-14 DIAGNOSIS — D6869 Other thrombophilia: Secondary | ICD-10-CM

## 2020-05-14 DIAGNOSIS — N184 Chronic kidney disease, stage 4 (severe): Secondary | ICD-10-CM | POA: Diagnosis not present

## 2020-05-14 DIAGNOSIS — Z87440 Personal history of urinary (tract) infections: Secondary | ICD-10-CM | POA: Insufficient documentation

## 2020-05-14 DIAGNOSIS — Z933 Colostomy status: Secondary | ICD-10-CM | POA: Insufficient documentation

## 2020-05-14 DIAGNOSIS — Z888 Allergy status to other drugs, medicaments and biological substances status: Secondary | ICD-10-CM | POA: Diagnosis not present

## 2020-05-14 DIAGNOSIS — Z7901 Long term (current) use of anticoagulants: Secondary | ICD-10-CM | POA: Diagnosis not present

## 2020-05-14 DIAGNOSIS — Z95810 Presence of automatic (implantable) cardiac defibrillator: Secondary | ICD-10-CM | POA: Diagnosis not present

## 2020-05-14 DIAGNOSIS — G35 Multiple sclerosis: Secondary | ICD-10-CM | POA: Insufficient documentation

## 2020-05-14 DIAGNOSIS — R5383 Other fatigue: Secondary | ICD-10-CM | POA: Diagnosis not present

## 2020-05-14 DIAGNOSIS — Z79899 Other long term (current) drug therapy: Secondary | ICD-10-CM | POA: Diagnosis not present

## 2020-05-14 DIAGNOSIS — Z79890 Hormone replacement therapy (postmenopausal): Secondary | ICD-10-CM | POA: Diagnosis not present

## 2020-05-14 DIAGNOSIS — Z880 Allergy status to penicillin: Secondary | ICD-10-CM | POA: Diagnosis not present

## 2020-05-14 DIAGNOSIS — I130 Hypertensive heart and chronic kidney disease with heart failure and stage 1 through stage 4 chronic kidney disease, or unspecified chronic kidney disease: Secondary | ICD-10-CM | POA: Diagnosis not present

## 2020-05-14 DIAGNOSIS — I5022 Chronic systolic (congestive) heart failure: Secondary | ICD-10-CM | POA: Insufficient documentation

## 2020-05-14 DIAGNOSIS — Z86004 Personal history of in-situ neoplasm of other and unspecified digestive organs: Secondary | ICD-10-CM | POA: Insufficient documentation

## 2020-05-14 NOTE — Progress Notes (Signed)
Primary Care Physician: Derrill Center., MD Primary Cardiologist: Dr Radford Pax Primary Electrophysiologist: Dr Curt Bears [facility]: Dr Aundra Dubin Referring Physician: Device Clinic   Jeanne Bruce is a 64 y.o. female with a history of CKD stage 4, chronic systolic CHF, multiple sclerosis, right nephrectomy, remote colon cancer with colostomy, and paroxysmal atrial fibrillation who presents for consultation in the [facility]. She has had frequent UTIs and was admitted in 10/20 with urosepsis.  She had respiratory arrest and VF arrest in the ER and was intubated.  Echo was done, showing EF 25% with apical ballooning.  LHC showed no significant coronary disease. She ended up having right nephrectomy for renal abscess in 10/20. Echo was repeated in 12/20, showing EF still low at 25-30% with peri-apical akinesis. Echo in 4/21 showed EF 25% with septal-lateral dyssynchrony, normal RV.  Medtronic CRT-D device was placed in 6/21. Afib was noted on device interrogation and she was started on Eliquis 04/26/20 for a CHADS2VASC score of 4. Patient reports that she has had constant fatigue since pacemaker implant. She did not feel any worse on the days she was noted to have afib and had no awareness of her arrhythmia. She denies SOB or edema.   Today, she denies symptoms of palpitations, chest pain, shortness of breath, orthopnea, PND, lower extremity edema, dizziness, presyncope, syncope, snoring, daytime somnolence, bleeding, or neurologic sequela. The patient is tolerating medications without difficulties and is otherwise without complaint today. +fatigue   Atrial Fibrillation Risk Factors:  she does not have symptoms or diagnosis of sleep apnea. she does not have a history of rheumatic fever. she does not have a history of alcohol use. The patient does have a history of early familial atrial fibrillation or other arrhythmias. Dad has afib.  she has a BMI of Body mass index is 29.1  kg/m.Marland Kitchen Filed Weights   05/14/20 0859  Weight: 67.6 kg    Family History  Problem Relation Age of Onset  . Atrial fibrillation Father   . Diabetes Mellitus II Neg Hx      Atrial Fibrillation Management history:  Previous antiarrhythmic drugs: none Previous cardioversions: none Previous ablations: none CHADS2VASC score: 4 Anticoagulation history: Eliquis   Past Medical History:  Diagnosis Date  . Cancer ([place])    colon (resolved)  . Hypertension   . Multiple sclerosis ([place])   . Renal disorder    Past Surgical History:  Procedure Laterality Date  . BIV ICD INSERTION CRT-D N/A 04/05/2020   Procedure: BIV ICD INSERTION CRT-D;  Surgeon: Constance Haw, MD;  Location: [facility];  Service: Cardiovascular;  Laterality: N/A;  . LEFT HEART CATH AND CORONARY ANGIOGRAPHY N/A 08/14/2019   Procedure: LEFT HEART CATH AND CORONARY ANGIOGRAPHY;  Surgeon: Burnell Blanks, MD;  Location: [facility];  Service: Cardiovascular;  Laterality: N/A;  . NEPHRECTOMY TRANSPLANTED ORGAN      Current Outpatient Medications  Medication Sig Dispense Refill  . apixaban (ELIQUIS) 5 MG TABS tablet Take 1 tablet (5 mg total) by mouth 2 (two) times daily. 60 tablet 11  . carvedilol (COREG) 6.25 MG tablet Take 1 tablet (6.25 mg total) by mouth 2 (two) times daily. 60 tablet 3  . cholecalciferol (VITAMIN D3) 25 MCG (1000 UT) tablet Take 1,000 Units by mouth in the morning and at bedtime.     . conjugated estrogens (PREMARIN) vaginal cream Place 1 Applicatorful vaginally at bedtime.    . Cyanocobalamin (VITAMIN B12 PO) Take 1  tablet by mouth daily.     . magnesium oxide (MAG-OX) 400 MG tablet Take 400 mg by mouth daily.    Marland Kitchen omeprazole (PRILOSEC) 20 MG capsule Take 20 mg by mouth 2 (two) times daily.    . ondansetron (ZOFRAN) 4 MG tablet Take 4 mg by mouth every 8 (eight) hours as needed for nausea or vomiting.    . Probiotic Product (PROBIOTIC DAILY PO) Take 1 capsule by mouth  daily.     No current facility-administered medications for this encounter.    Allergies  Allergen Reactions  . Ativan [Lorazepam] Other (See Comments)    Resp arrest  . Invanz [Ertapenem] Anaphylaxis  . Mirabegron Other (See Comments)    Caused Hypertension  . Claritin-D 12 Hour [Loratadine-Pseudoephedrine Er] Rash  . Penicillins Rash    Pt tolerates cephalosporins  Did it involve swelling of the face/tongue/throat, SOB, or low BP? No Did it involve sudden or severe rash/hives, skin peeling, or any reaction on the inside of your mouth or nose? No Did you need to seek medical attention at a hospital or doctor's office? No When did it last happen?A long time ago per daughter  If all above answers are "NO", may proceed with cephalosporin use.     Social History   Socioeconomic History  . Marital status: Married    Spouse name: Not on file  . Number of children: Not on file  . Years of education: Not on file  . Highest education level: Not on file  Occupational History  . Not on file  Tobacco Use  . Smoking status: Never Smoker  . Smokeless tobacco: Never Used  Substance and Sexual Activity  . Alcohol use: Not Currently  . Drug use: Never  . Sexual activity: Not on file  Other Topics Concern  . Not on file  Social History Narrative  . Not on file   Social Determinants of Health   Financial Resource Strain:   . Difficulty of Paying Living Expenses:   Food Insecurity:   . Worried About Charity fundraiser in the Last Year:   . Arboriculturist in the Last Year:   Transportation Needs: No Transportation Needs  . Lack of Transportation (Medical): No  . Lack of Transportation (Non-Medical): No  Physical Activity: Insufficiently Active  . Days of Exercise per Week: 2 days  . Minutes of Exercise per Session: 10 min  Stress:   . Feeling of Stress :   Social Connections: Unknown  . Frequency of Communication with Friends and Family: More than three times a week   . Frequency of Social Gatherings with Friends and Family: Twice a week  . Attends Religious Services: More than 4 times per year  . Active Member of Clubs or Organizations: Not on file  . Attends Archivist Meetings: Not on file  . Marital Status: Married  Human resources officer Violence:   . Fear of Current or Ex-Partner:   . Emotionally Abused:   Marland Kitchen Physically Abused:   . Sexually Abused:      ROS- All systems are reviewed and negative except as per the HPI above.  Physical Exam: Vitals:   05/14/20 0859  BP: (!) 116/60  Pulse: 76  Weight: 67.6 kg  Height: 5' (1.524 m)    GEN- The patient is well appearing, alert and oriented x 3 today.   Head- normocephalic, atraumatic Eyes-  Sclera clear, conjunctiva pink Ears- hearing intact Oropharynx- clear Neck- supple  Lungs- Clear  to ausculation bilaterally, normal work of breathing Heart- Regular rate and rhythm, no murmurs, rubs or gallops  GI- soft, NT, ND, + BS Extremities- no clubbing, cyanosis, or edema MS- no significant deformity or atrophy Skin- no rash or lesion Psych- euthymic mood, full affect Neuro- strength and sensation are intact  Wt Readings from Last 3 Encounters:  05/14/20 67.6 kg  04/26/20 65.3 kg  04/05/20 63.5 kg    EKG today demonstrates A sense V paced rhythm HR 76, PR 168, QRS 128, QTc 508  Echo 01/20/20 demonstrated  1. Left ventricular ejection fraction, by estimation, is 25%. The left  ventricle has severely decreased function. The left ventricle demonstrates  global hypokinesis with septal-lateral dyssynchrony consistent with LBBB.  Left ventricular diastolic  parameters are consistent with Grade I diastolic dysfunction (impaired  relaxation).  2. Right ventricular systolic function is normal. The right ventricular  size is normal. Tricuspid regurgitation signal is inadequate for assessing  PA pressure.  3. Left atrial size was mildly dilated.  4. The mitral valve is normal in  structure. Trivial mitral valve  regurgitation. No evidence of mitral stenosis.  5. The aortic valve is tricuspid. Aortic valve regurgitation is trivial.  No aortic stenosis is present.  6. The inferior vena cava is normal in size with <50% respiratory  variability, suggesting right atrial pressure of 8 mmHg.   Epic records are reviewed at length today  CHA2DS2-VASc Score = 4  The patient's score is based upon: CHF History: 1 HTN History: 1 Age : 1 Diabetes History: 0 Stroke History: 0 Vascular Disease History: 0 Gender: 1      ASSESSMENT AND PLAN: 1. Paroxysmal Atrial Fibrillation (ICD10:  I48.0) The patient's CHA2DS2-VASc score is 4, indicating a 4.8% annual risk of stroke.   Afib burden on device 1.7% AAD options are limited with CHF and CKD stage IV.  Could consider amiodarone vs ablation if her afib burden should increase. Would likely favor ablation. Patient agreeable to discussing with Dr Curt Bears.  Continue Eliquis 5 mg BID Continue Coreg 6.25 mg BID  2. Secondary Hypercoagulable State (ICD10:  D68.69) The patient is at significant risk for stroke/thromboembolism based upon her CHA2DS2-VASc Score of 4.  Continue Apixaban (Eliquis).   3. HTN Stable, no changes today.  4. Chronic systolic CHF S/p CRT-D Followed by Dr Aundra Dubin.  5. Fatigue Unclear etiology. No symptoms of fluid overload. Symptoms appear out of proportion to afib burden. No CAD on LHC in 2020 ? Deconditioning. Encouraged slowly increasing physical activity.  Will check CBC.   Follow up with Dr Aundra Dubin and Dr Curt Bears as scheduled.    [facility] [address] [place], [place] [address] [phone] 05/14/2020 2:32 PM

## 2020-05-17 ENCOUNTER — Ambulatory Visit (HOSPITAL_COMMUNITY)
Admission: RE | Admit: 2020-05-17 | Discharge: 2020-05-17 | Disposition: A | Discharge status: Home / Self Care | Payer: Medicare Other | Source: Ambulatory Visit | Attending: Physician Assistant | Admitting: Physician Assistant

## 2020-05-17 ENCOUNTER — Other Ambulatory Visit: Payer: Self-pay

## 2020-05-17 DIAGNOSIS — I480 Paroxysmal atrial fibrillation: Secondary | ICD-10-CM | POA: Diagnosis present

## 2020-05-17 LAB — CBC
HCT: 29.6 % — ABNORMAL LOW (ref 36.0–46.0)
Hemoglobin: 8.8 g/dL — ABNORMAL LOW (ref 12.0–15.0)
MCH: 30.9 pg (ref 26.0–34.0)
MCHC: 29.7 g/dL — ABNORMAL LOW (ref 30.0–36.0)
MCV: 103.9 fL — ABNORMAL HIGH (ref 80.0–100.0)
Platelets: 214 10*3/uL (ref 150–400)
RBC: 2.85 MIL/uL — ABNORMAL LOW (ref 3.87–5.11)
RDW: 13 % (ref 11.5–15.5)
WBC: 6.6 10*3/uL (ref 4.0–10.5)
nRBC: 0 % (ref 0.0–0.2)

## 2020-05-20 ENCOUNTER — Other Ambulatory Visit (HOSPITAL_COMMUNITY): Payer: Self-pay | Admitting: Cardiology

## 2020-05-21 ENCOUNTER — Encounter (HOSPITAL_COMMUNITY): Payer: Self-pay

## 2020-06-29 ENCOUNTER — Ambulatory Visit (HOSPITAL_COMMUNITY)
Admission: RE | Admit: 2020-06-29 | Discharge: 2020-06-29 | Disposition: A | Discharge status: Home / Self Care | Payer: Medicare Other | Source: Ambulatory Visit | Attending: Cardiology | Admitting: Cardiology

## 2020-06-29 ENCOUNTER — Encounter (HOSPITAL_COMMUNITY): Payer: Self-pay

## 2020-06-29 ENCOUNTER — Other Ambulatory Visit: Payer: Self-pay

## 2020-06-29 VITALS — BP 132/70 | HR 70 | Ht 60.0 in | Wt 148.8 lb

## 2020-06-29 DIAGNOSIS — Z87440 Personal history of urinary (tract) infections: Secondary | ICD-10-CM | POA: Diagnosis not present

## 2020-06-29 DIAGNOSIS — Z85038 Personal history of other malignant neoplasm of large intestine: Secondary | ICD-10-CM | POA: Insufficient documentation

## 2020-06-29 DIAGNOSIS — Z9049 Acquired absence of other specified parts of digestive tract: Secondary | ICD-10-CM | POA: Insufficient documentation

## 2020-06-29 DIAGNOSIS — I5022 Chronic systolic (congestive) heart failure: Secondary | ICD-10-CM | POA: Diagnosis not present

## 2020-06-29 DIAGNOSIS — N184 Chronic kidney disease, stage 4 (severe): Secondary | ICD-10-CM | POA: Diagnosis not present

## 2020-06-29 DIAGNOSIS — Z8249 Family history of ischemic heart disease and other diseases of the circulatory system: Secondary | ICD-10-CM | POA: Insufficient documentation

## 2020-06-29 DIAGNOSIS — Z7901 Long term (current) use of anticoagulants: Secondary | ICD-10-CM | POA: Diagnosis not present

## 2020-06-29 DIAGNOSIS — I447 Left bundle-branch block, unspecified: Secondary | ICD-10-CM | POA: Insufficient documentation

## 2020-06-29 DIAGNOSIS — I480 Paroxysmal atrial fibrillation: Secondary | ICD-10-CM | POA: Diagnosis not present

## 2020-06-29 DIAGNOSIS — I428 Other cardiomyopathies: Secondary | ICD-10-CM | POA: Diagnosis not present

## 2020-06-29 DIAGNOSIS — Z905 Acquired absence of kidney: Secondary | ICD-10-CM | POA: Insufficient documentation

## 2020-06-29 DIAGNOSIS — Z933 Colostomy status: Secondary | ICD-10-CM | POA: Diagnosis not present

## 2020-06-29 DIAGNOSIS — I130 Hypertensive heart and chronic kidney disease with heart failure and stage 1 through stage 4 chronic kidney disease, or unspecified chronic kidney disease: Secondary | ICD-10-CM | POA: Diagnosis not present

## 2020-06-29 DIAGNOSIS — Z79899 Other long term (current) drug therapy: Secondary | ICD-10-CM | POA: Diagnosis not present

## 2020-06-29 DIAGNOSIS — G35 Multiple sclerosis: Secondary | ICD-10-CM | POA: Diagnosis not present

## 2020-06-29 LAB — BASIC METABOLIC PANEL
Anion gap: 6 (ref 5–15)
BUN: 43 mg/dL — ABNORMAL HIGH (ref 8–23)
CO2: 23 mmol/L (ref 22–32)
Calcium: 8.6 mg/dL — ABNORMAL LOW (ref 8.9–10.3)
Chloride: 108 mmol/L (ref 98–111)
Creatinine, Ser: 2.38 mg/dL — ABNORMAL HIGH (ref 0.44–1.00)
GFR calc Af Amer: 24 mL/min — ABNORMAL LOW (ref >60)
GFR calc non Af Amer: 21 mL/min — ABNORMAL LOW (ref >60)
Glucose, Bld: 93 mg/dL (ref 70–99)
Potassium: 4.5 mmol/L (ref 3.5–5.1)
Sodium: 137 mmol/L (ref 135–145)

## 2020-06-29 LAB — CBC
HCT: 32.2 % — ABNORMAL LOW (ref 36.0–46.0)
Hemoglobin: 10 g/dL — ABNORMAL LOW (ref 12.0–15.0)
MCH: 31.2 pg (ref 26.0–34.0)
MCHC: 31.1 g/dL (ref 30.0–36.0)
MCV: 100.3 fL — ABNORMAL HIGH (ref 80.0–100.0)
Platelets: 157 10*3/uL (ref 150–400)
RBC: 3.21 MIL/uL — ABNORMAL LOW (ref 3.87–5.11)
RDW: 13 % (ref 11.5–15.5)
WBC: 7.5 10*3/uL (ref 4.0–10.5)
nRBC: 0 % (ref 0.0–0.2)

## 2020-06-29 NOTE — Progress Notes (Signed)
[name], DOB 12/05/1955, MRN [id number]   Provider location: [facility] Type of Visit: Established patient   PCP:  Derrill Bruce., MD  Cardiologist:  Fransico Him, MD HF Cardiolgoy: Dr Aundra Dubin  Reason for Visit: F/u for chronic systolic heart failure and PAF    History of Present Illness: Jeanne Bruce is a 64 y.o. female who has a history of CKD stage 4, chronic systolic CHF, multiple sclerosis, and right nephrectomy.  She was referred by Dr. Radford Bruce for CHF evaluation.  Patient also had remote colon cancer with colostomy.  She has had frequent UTIs and was admitted in 10/20 with urosepsis.  She had respiratory arrest and VF arrest in the ER and was intubated.  Echo was done, showing EF 25% with apical ballooning.  LHC showed no significant coronary disease.  She ended up having right nephrectomy for renal abscess in 10/20.    Echo was repeated in 12/20, showing EF still low at 25-30% with peri-apical akinesis.   She has had a hard time taking cardiac medications due to orthostatic symptoms with low BP.  She was sent home from the hospital in 10/20 on hydralazine/Imdur but had to stop due to low BP.    Echo in 4/21 showed EF 25% with septal-lateral dyssynchrony, normal RV.  Medtronic CRT-D device was placed in 6/21.    She had return f/u in July and noted increased fatigue. Device interrogation revealed frequent PAF. She was started on Eliquis and referred to Afib clinic. They discussed AAD therapy w/ Amiodarone vs ablation if her afib burden should increase. Last device interrogation, Afib burden was only 1.7%. Also of note, spironolactone was recently discontinued due to hyperkalemia and worsening SCr.   Today in clinic, she reports she feels much better. She has had no further detection of afib since ~1 month ago, around 7/10. Her fatigue has resolved. Denies exertional dyspnea. She is active, walks and uses the elliptical for exercise w/o limitation or having to stop  to rest. She notes some occasional bruising but no other abnormal bleeding w/ Eliquis. BP is well controlled. Wt stable. Euvolemic on exam and by Optivol.    Medtronic device interrogation: > 99% BiV pacing, no further Afib since ~7/10. Optivol ok   Labs (11/20): creatinine 1.5 Labs (12/20): creatinine 2.47 Labs (1/21): creatinine 2.1 => 2.04, BNP 54 Labs (6/21): creatinine 2.33 Labs (7/21): K 5.7, creatinine 2.61  ECG: not performed.    PMH: 1. H/o right nephrectomy for renal abscess in 10/20.  2. Multiple Sclerosis 3. HTN 4. H/o colon cancer: s/p colectomy with colostomy.  5. CKD: Stage 4. Sees a nephrologist at [facility].  6. Frequent UTIs.  7. Chronic systolic CHF: Nonischemic cardiomyopathy. Medtronic CRT-D device.  - Echo (10/20): EF 25%, apical ballooning.  - LHC (10/20): Normal coronaries.  - Echo (12/20): EF 25-30%, peri-apical akinesis, normal RV size and systolic function (similar to 10/20 echo).   - Echo (4/21): EF 25%, septal-lateral dyssynchrony 8. LBBB  Social History   Socioeconomic History  . Marital status: Married    Spouse name: Not on file  . Number of children: Not on file  . Years of education: Not on file  . Highest education level: Not on file  Occupational History  . Not on file  Tobacco Use  . Smoking status: Never Smoker  . Smokeless tobacco: Never Used  Substance and Sexual Activity  . Alcohol use: Not Currently  . Drug use: Never  .  Sexual activity: Not on file  Other Topics Concern  . Not on file  Social History Narrative  . Not on file   Social Determinants of Health   Financial Resource Strain:   . Difficulty of Paying Living Expenses: Not on file  Food Insecurity:   . Worried About Charity fundraiser in the Last Year: Not on file  . Ran Out of Food in the Last Year: Not on file  Transportation Needs: No Transportation Needs  . Lack of Transportation (Medical): No  . Lack of Transportation (Non-Medical): No  Physical Activity:  Insufficiently Active  . Days of Exercise per Week: 2 days  . Minutes of Exercise per Session: 10 min  Stress:   . Feeling of Stress : Not on file  Social Connections: Unknown  . Frequency of Communication with Friends and Family: More than three times a week  . Frequency of Social Gatherings with Friends and Family: Twice a week  . Attends Religious Services: More than 4 times per year  . Active Member of Clubs or Organizations: Not on file  . Attends Archivist Meetings: Not on file  . Marital Status: Married  Human resources officer Violence:   . Fear of Current or Ex-Partner: Not on file  . Emotionally Abused: Not on file  . Physically Abused: Not on file  . Sexually Abused: Not on file   Family History  Problem Relation Age of Onset  . Atrial fibrillation Father   . Diabetes Mellitus II Neg Hx    ROS: All systems reviewed and negative except as per HPI.   Current Outpatient Medications  Medication Sig Dispense Refill  . apixaban (ELIQUIS) 5 MG TABS tablet Take 1 tablet (5 mg total) by mouth 2 (two) times daily. 60 tablet 11  . carvedilol (COREG) 6.25 MG tablet Take 1 tablet (6.25 mg total) by mouth 2 (two) times daily. 60 tablet 3  . cholecalciferol (VITAMIN D3) 25 MCG (1000 UT) tablet Take 1,000 Units by mouth in the morning and at bedtime.     . conjugated estrogens (PREMARIN) vaginal cream Place 1 Applicatorful vaginally at bedtime.    . Cyanocobalamin (VITAMIN B12 PO) Take 1 tablet by mouth daily.     . magnesium oxide (MAG-OX) 400 MG tablet Take 400 mg by mouth daily.    Marland Kitchen omeprazole (PRILOSEC) 20 MG capsule Take 20 mg by mouth 2 (two) times daily.    . ondansetron (ZOFRAN) 4 MG tablet Take 4 mg by mouth every 8 (eight) hours as needed for nausea or vomiting.    . Probiotic Product (PROBIOTIC DAILY PO) Take 1 capsule by mouth daily.     No current facility-administered medications for this encounter.   Exam:   Ht 5' (1.524 m)   Wt 67.5 kg (148 lb 12.8 oz)   BMI  29.06 kg/m  PHYSICAL EXAM: General:  Well appearing. No respiratory difficulty HEENT: normal Neck: supple. no JVD. Carotids 2+ bilat; no bruits. No lymphadenopathy or thyromegaly appreciated. Cor: PMI nondisplaced. Regular rate & rhythm. No rubs, gallops or murmurs. Lungs: clear Abdomen: soft, nontender, nondistended. No hepatosplenomegaly. No bruits or masses. Good bowel sounds. Extremities: no cyanosis, clubbing, rash, edema Neuro: alert & oriented x 3, cranial nerves grossly intact. moves all 4 extremities w/o difficulty. Affect pleasant.   Assessment/Plan:  1. Chronic systolic CHF: Nonischemic cardiomyopathy, LHC in 10/20 with no significant coronary disease.  Echo in 10/20 was suggestive of stress (Takotsubo-type) cardiomyopathy with apical ballooning (from severe urosepsis).  However, LV function did not improve on repeat echo in 12/20 or echo in 4/21 (EF still 25%).  ?LBBB cardiomyopathy.  She is now s/p Medtronic CRT-D device.  NYHA class II symptoms. Euvolemic on exam and by Optivol. She has had a hard time tolerating cardiac meds due to orthostasis, but this has been improved recently. Spironolactone recently discontinued due to hyperkalemia and rising SCr.  - Continue current regimen. Does not appear to need Lasix at this time.  - Continue Coreg 6.25 mg bid.  - No ACEI/ARNI/ARB nor spiro or digoxin for now with CKD stage 4 and recent hyperkalemia  2. CKD stage 4: S/p right nephrectomy in 10/20.  Sees nephrology at [facility]. Creatinine up to 2.6 on most recent BMP. Now off spiro  - repeat BMP today  3. Atrial fibrillation: Paroxysmal.  Noted after device implantation.  RRR on exam today. No further Afib detection since ~7/10.         - continue  blocker                                   - Continue apixaban 5 mg bid. Denies abnormal bleeding. Check CBC today  - Will continue to follow device for atrial fibrillation burden.  May need to consider ablation or antiarrhythmic in the future.    Recommended follow-up: Dr. Aundra Dubin in 3-4 months.   Signed, Lyda Jester, PA-C  06/29/2020  [facility] [address] [facility] [place] [address] [phone] (office) [phone] (fax)

## 2020-06-29 NOTE — Patient Instructions (Signed)
It was great to see you today! No medication changes are needed at this time.  Labs today We will only contact you if something comes back abnormal or we need to make some changes. Otherwise no news is good news!  Your physician recommends that you schedule a follow-up appointment in: 4 months with Dr Aundra Dubin  If you have any questions or concerns before your next appointment please send [place] a message through [place] or call our office at [phone].    TO LEAVE A MESSAGE FOR THE NURSE SELECT OPTION 2, PLEASE LEAVE A MESSAGE INCLUDING:  YOUR NAME  DATE OF BIRTH  CALL BACK NUMBER  REASON FOR CALL**this is important as we prioritize the call backs  YOU WILL RECEIVE A CALL BACK THE SAME DAY AS LONG AS YOU CALL BEFORE 4:00 PM

## 2020-06-30 NOTE — Addendum Note (Signed)
Encounter addended by: Danylle Ouk Q, CMA on: 06/30/2020 2:27 PM ° Actions taken: Charge Capture section accepted

## 2020-07-05 ENCOUNTER — Ambulatory Visit (INDEPENDENT_AMBULATORY_CARE_PROVIDER_SITE_OTHER): Payer: BC Managed Care – PPO | Admitting: *Deleted

## 2020-07-05 DIAGNOSIS — I428 Other cardiomyopathies: Secondary | ICD-10-CM

## 2020-07-06 LAB — CUP PACEART REMOTE DEVICE CHECK
Battery Remaining Longevity: 110 mo
Battery Voltage: 3.12 V
Brady Statistic AP VP Percent: 0.01 %
Brady Statistic AP VS Percent: 0 %
Brady Statistic AS VP Percent: 98.43 %
Brady Statistic AS VS Percent: 1.56 %
Brady Statistic RA Percent Paced: 0.02 %
Brady Statistic RV Percent Paced: 1.51 %
Date Time Interrogation Session: 20210920043825
HighPow Impedance: 62 Ohm
Implantable Lead Implant Date: 20210621
Implantable Lead Implant Date: 20210621
Implantable Lead Implant Date: 20210621
Implantable Lead Location: 753858
Implantable Lead Location: 753859
Implantable Lead Location: 753860
Implantable Lead Model: 4398
Implantable Lead Model: 5076
Implantable Pulse Generator Implant Date: 20210621
Lead Channel Impedance Value: 123.5 Ohm
Lead Channel Impedance Value: 123.5 Ohm
Lead Channel Impedance Value: 123.5 Ohm
Lead Channel Impedance Value: 128.074
Lead Channel Impedance Value: 128.074
Lead Channel Impedance Value: 247 Ohm
Lead Channel Impedance Value: 247 Ohm
Lead Channel Impedance Value: 247 Ohm
Lead Channel Impedance Value: 266 Ohm
Lead Channel Impedance Value: 304 Ohm
Lead Channel Impedance Value: 323 Ohm
Lead Channel Impedance Value: 342 Ohm
Lead Channel Impedance Value: 380 Ohm
Lead Channel Impedance Value: 399 Ohm
Lead Channel Impedance Value: 399 Ohm
Lead Channel Impedance Value: 399 Ohm
Lead Channel Impedance Value: 399 Ohm
Lead Channel Impedance Value: 437 Ohm
Lead Channel Pacing Threshold Amplitude: 0.375 V
Lead Channel Pacing Threshold Amplitude: 0.75 V
Lead Channel Pacing Threshold Pulse Width: 0.4 ms
Lead Channel Pacing Threshold Pulse Width: 0.4 ms
Lead Channel Sensing Intrinsic Amplitude: 2.25 mV
Lead Channel Sensing Intrinsic Amplitude: 2.25 mV
Lead Channel Sensing Intrinsic Amplitude: 7.125 mV
Lead Channel Sensing Intrinsic Amplitude: 7.125 mV
Lead Channel Setting Pacing Amplitude: 1.5 V
Lead Channel Setting Pacing Amplitude: 3.25 V
Lead Channel Setting Pacing Amplitude: 3.25 V
Lead Channel Setting Pacing Pulse Width: 0.4 ms
Lead Channel Setting Pacing Pulse Width: 0.4 ms
Lead Channel Setting Sensing Sensitivity: 0.3 mV

## 2020-07-07 NOTE — Progress Notes (Signed)
Remote ICD transmission.   

## 2020-07-12 ENCOUNTER — Encounter: Payer: Self-pay | Admitting: Cardiology

## 2020-07-12 ENCOUNTER — Ambulatory Visit (INDEPENDENT_AMBULATORY_CARE_PROVIDER_SITE_OTHER): Payer: Medicare Other | Admitting: Cardiology

## 2020-07-12 ENCOUNTER — Other Ambulatory Visit: Payer: Self-pay

## 2020-07-12 VITALS — BP 142/80 | HR 68 | Ht 60.0 in | Wt 147.0 lb

## 2020-07-12 DIAGNOSIS — I428 Other cardiomyopathies: Secondary | ICD-10-CM

## 2020-07-12 NOTE — Patient Instructions (Signed)
Medication Instructions:  Your physician recommends that you continue on your current medications as directed. Please refer to the Current Medication list given to you today.  *If you need a refill on your cardiac medications before your next appointment, please call your pharmacy*   Lab Work: None ordered   Testing/Procedures: None ordered   Follow-Up: At [facility], you and your health needs are our priority.  As part of our continuing mission to provide you with exceptional heart care, we have created designated Provider Care Teams.  These Care Teams include your primary Cardiologist (physician) and Advanced Practice Providers (APPs -  Physician Assistants and Nurse Practitioners) who all work together to provide you with the care you need, when you need it.  Remote monitoring is used to monitor your Pacemaker or ICD from home. This monitoring reduces the number of office visits required to check your device to one time per year. It allows [place] to keep an eye on the functioning of your device to ensure it is working properly. You are scheduled for a device check from home on 10/04/20. You may send your transmission at any time that day. If you have a wireless device, the transmission will be sent automatically. After your physician reviews your transmission, you will receive a postcard with your next transmission date.  Your next appointment:   6 month(s)  The format for your next appointment:   In Person  Provider:   Allegra Lai, MD   Thank you for choosing [facility]!!   Trinidad Curet, RN [phone]

## 2020-07-12 NOTE — Progress Notes (Signed)
Electrophysiology Office Note   Date:  07/12/2020   ID:  SHAWNDA MAUNEY, DOB [birth year], MRN [id number]  PCP:  Derrill Center., MD  Cardiologist:  Aundra Dubin Primary Electrophysiologist:  Shannara Winbush Meredith Leeds, MD    Chief Complaint: CHF   History of Present Illness: Jeanne Bruce is a 64 y.o. female who is being seen today for the evaluation of CHF at the request of Derrill Center., MD. Presenting today for electrophysiology evaluation.  She has a history significant for stage IV CKD, chronic [place] heart failure due to nonischemic cardiomyopathy, multiple sclerosis, and a right ectomy.  She also has history of colon cancer and has colostomy.  She has had frequent UTIs and was admitted October 2020 with sepsis.  She had a respiratory arrest and VF arrest in the emergency room and was intubated.  Echo was done that showed an ejection fraction of 25% with apical ballooning.  Left heart catheterization showed no evidence of coronary artery disease.  She had a right nephrectomy for renal abscess during that admission.  Echo December 2020 showed a persistently low ejection fraction with Apical akinesis.  She is now status post Medtronic CRT-D implanted 04/05/2020.  On device interrogation, she was found to have atrial fibrillation and has since been started on Eliquis.  She was noted to have fatigue while in atrial fibrillation.   Today, denies symptoms of palpitations, chest pain, shortness of breath, orthopnea, PND, lower extremity edema, claudication, dizziness, presyncope, syncope, bleeding, or neurologic sequela. The patient is tolerating medications without difficulties.  Since her device was implanted she has felt well.  She has had more energy and less shortness of breath.  She is able do most of her daily activities without restriction.  She states that she feels back to normal.  She is not aware of any further episodes of atrial fibrillation since being seen in A. fib clinic.   Past Medical History:   Diagnosis Date  . Cancer ([place])    colon (resolved)  . Hypertension   . Multiple sclerosis ([place])   . Renal disorder    Past Surgical History:  Procedure Laterality Date  . BIV ICD INSERTION CRT-D N/A 04/05/2020   Procedure: BIV ICD INSERTION CRT-D;  Surgeon: Constance Haw, MD;  Location: [facility];  Service: Cardiovascular;  Laterality: N/A;  . LEFT HEART CATH AND CORONARY ANGIOGRAPHY N/A 08/14/2019   Procedure: LEFT HEART CATH AND CORONARY ANGIOGRAPHY;  Surgeon: Burnell Blanks, MD;  Location: [facility];  Service: Cardiovascular;  Laterality: N/A;  . NEPHRECTOMY TRANSPLANTED ORGAN       Current Outpatient Medications  Medication Sig Dispense Refill  . apixaban (ELIQUIS) 5 MG TABS tablet Take 1 tablet (5 mg total) by mouth 2 (two) times daily. 60 tablet 11  . carvedilol (COREG) 6.25 MG tablet Take 1 tablet (6.25 mg total) by mouth 2 (two) times daily. 60 tablet 3  . cholecalciferol (VITAMIN D3) 25 MCG (1000 UT) tablet Take 1,000 Units by mouth in the morning and at bedtime.     . conjugated estrogens (PREMARIN) vaginal cream Place 1 Applicatorful vaginally at bedtime.    . Cyanocobalamin (VITAMIN B12 PO) Take 1 tablet by mouth daily.     . magnesium oxide (MAG-OX) 400 MG tablet Take 400 mg by mouth daily.    Marland Kitchen omeprazole (PRILOSEC) 20 MG capsule Take 20 mg by mouth 2 (two) times daily.    . ondansetron (ZOFRAN) 4 MG tablet Take 4  mg by mouth every 8 (eight) hours as needed for nausea or vomiting.    . Probiotic Product (PROBIOTIC DAILY PO) Take 1 capsule by mouth daily.     No current facility-administered medications for this visit.    Allergies:   Ativan [lorazepam], Invanz [ertapenem], Mirabegron, Claritin-d 12 hour [loratadine-pseudoephedrine er], and Penicillins   Social History:  The patient  reports that she has never smoked. She has never used smokeless tobacco. She reports previous alcohol use. She reports that she does not use drugs.    Family History:  The patient's family history includes Atrial fibrillation in her father.    ROS:  Please see the history of present illness.   Otherwise, review of systems is positive for none.   All other systems are reviewed and negative.   PHYSICAL EXAM: VS:  BP (!) 142/80   Pulse 68   Ht 5' (1.524 m)   Wt 147 lb (66.7 kg)   SpO2 98%   BMI 28.71 kg/m  , BMI Body mass index is 28.71 kg/m. GEN: Well nourished, well developed, in no acute distress  HEENT: normal  Neck: no JVD, carotid bruits, or masses Cardiac: RRR; no murmurs, rubs, or gallops,no edema  Respiratory:  clear to auscultation bilaterally, normal work of breathing GI: soft, nontender, nondistended, + BS MS: no deformity or atrophy  Skin: warm and dry, device site well healed Neuro:  Strength and sensation are intact Psych: euthymic mood, full affect  EKG:  EKG is ordered today. Personal review of the ekg ordered shows atrial sensed, ventricular paced rate 68  Personal review of the device interrogation today. Results in [place]: 08/12/2019: Magnesium 2.3 08/13/2019: ALT 27 11/07/2019: B Natriuretic Peptide 53.8 06/29/2020: BUN 43; Creatinine, Ser 2.38; Hemoglobin 10.0; Platelets 157; Potassium 4.5; Sodium 137    Lipid Panel     Component Value Date/Time   CHOL 160 08/11/2019 0830   TRIG 165 (H) 08/11/2019 0830   HDL 35 (L) 08/11/2019 0830   CHOLHDL 4.6 08/11/2019 0830   VLDL 33 08/11/2019 0830   LDLCALC 92 08/11/2019 0830     Wt Readings from Last 3 Encounters:  07/12/20 147 lb (66.7 kg)  06/29/20 148 lb 12.8 oz (67.5 kg)  05/14/20 149 lb (67.6 kg)      Other studies Reviewed: Additional studies/ records that were reviewed today include: TTE 01/20/20  Review of the above records today demonstrates:  1. Left ventricular ejection fraction, by estimation, is 25%. The left  ventricle has severely decreased function. The left ventricle demonstrates  global hypokinesis with  septal-lateral dyssynchrony consistent with LBBB.  Left ventricular diastolic  parameters are consistent with Grade I diastolic dysfunction (impaired  relaxation).  2. Right ventricular systolic function is normal. The right ventricular  size is normal. Tricuspid regurgitation signal is inadequate for assessing  PA pressure.  3. Left atrial size was mildly dilated.  4. The mitral valve is normal in structure. Trivial mitral valve  regurgitation. No evidence of mitral stenosis.  5. The aortic valve is tricuspid. Aortic valve regurgitation is trivial.  No aortic stenosis is present.  6. The inferior vena cava is normal in size with <50% respiratory  variability, suggesting right atrial pressure of 8 mmHg.    ASSESSMENT AND PLAN:  1.  Chronic systolic heart failure due to nonischemic cardiomyopathy: Currently on optimal medical therapy.  Status post Medtronic CRT-D implanted 04/05/2020.  Device is functioning appropriately.  No changes at this time.  2.  CKD stage IV: Status post nephrectomy October 2020.  Follows with neurology.    3.  Paroxysmal atrial fibrillation: CHA2DS2-VASc of 4.  Currently on Coreg and Eliquis.  Aside from her initial episodes of atrial fibrillation, she has not had much in the last few months.  She would prefer to hold off on further therapy for her atrial fibrillation aside from her anticoagulation.  I do feel that she would be a good ablation candidate if her atrial fibrillation occurs more often.  I Yaremi Stahlman see her back in 6 months for further discussions.  This Joshia Kitchings also give time for her ICD leads to heal in place.  4.  Hypertension: Mildly elevated today but usually well controlled.  No changes.  Current medicines are reviewed at length with the patient today.   The patient does not have concerns regarding her medicines.  The following changes were made today: None  Labs/ tests ordered today include:  Orders Placed This Encounter  Procedures  . EKG  12-Lead     Disposition:   FU with Undrea Shipes 6 months  Signed, Jader Desai Meredith Leeds, MD  07/12/2020 2:27 PM     [facility] [address] [place] [address] [phone] (office) [phone] (fax)

## 2020-08-16 ENCOUNTER — Telehealth: Payer: Self-pay | Admitting: Cardiology

## 2020-08-16 NOTE — Telephone Encounter (Signed)
1. What dental office are you calling from?  [facility]  Dr. Marylyn Ishihara  2. What is your office phone number? [phone]   3. What is your fax number? [phone]  4. What type of procedure is the patient having performed? Tooth extraction (#14)   What date is procedure scheduled or is the patient there now? 08/17/2020   5. What is your question? Their office is inquiring about whether or not the patient can hold Eliquis prior to extraction. Please advise.

## 2020-08-16 NOTE — Telephone Encounter (Signed)
   Primary Cardiologist: Fransico Him, MD  Chart reviewed as part of pre-operative protocol coverage.   Simple dental extractions (1-2 teeth) are considered low risk procedures per guidelines and generally do not require any specific cardiac clearance. It is also generally accepted that for simple extractions and dental cleanings, there is no need to interrupt blood thinner therapy.  SBE prophylaxis is not required for the patient from a cardiac standpoint.  I will route this recommendation to the requesting party via Epic fax function and remove from pre-op pool.  Please call with questions.  [place], [place] 08/16/2020, 10:45 AM

## 2020-09-08 ENCOUNTER — Other Ambulatory Visit: Payer: Self-pay | Admitting: Physician Assistant

## 2020-09-08 NOTE — Telephone Encounter (Signed)
This is Dr. Mclean's pt. °

## 2020-10-04 ENCOUNTER — Ambulatory Visit (INDEPENDENT_AMBULATORY_CARE_PROVIDER_SITE_OTHER): Payer: BC Managed Care – PPO

## 2020-10-04 DIAGNOSIS — I428 Other cardiomyopathies: Secondary | ICD-10-CM

## 2020-10-04 DIAGNOSIS — I5022 Chronic systolic (congestive) heart failure: Secondary | ICD-10-CM

## 2020-10-05 LAB — CUP PACEART REMOTE DEVICE CHECK
Battery Remaining Longevity: 115 mo
Battery Voltage: 3.07 V
Brady Statistic AP VP Percent: 0.25 %
Brady Statistic AP VS Percent: 0.01 %
Brady Statistic AS VP Percent: 97.91 %
Brady Statistic AS VS Percent: 1.84 %
Brady Statistic RA Percent Paced: 0.25 %
Brady Statistic RV Percent Paced: 4.14 %
Date Time Interrogation Session: 20211220001705
HighPow Impedance: 76 Ohm
Implantable Lead Implant Date: 20210621
Implantable Lead Implant Date: 20210621
Implantable Lead Implant Date: 20210621
Implantable Lead Location: 753858
Implantable Lead Location: 753859
Implantable Lead Location: 753860
Implantable Lead Model: 4398
Implantable Lead Model: 5076
Implantable Pulse Generator Implant Date: 20210621
Lead Channel Impedance Value: 161.5 Ohm
Lead Channel Impedance Value: 161.5 Ohm
Lead Channel Impedance Value: 161.5 Ohm
Lead Channel Impedance Value: 161.5 Ohm
Lead Channel Impedance Value: 161.5 Ohm
Lead Channel Impedance Value: 323 Ohm
Lead Channel Impedance Value: 323 Ohm
Lead Channel Impedance Value: 323 Ohm
Lead Channel Impedance Value: 323 Ohm
Lead Channel Impedance Value: 342 Ohm
Lead Channel Impedance Value: 380 Ohm
Lead Channel Impedance Value: 380 Ohm
Lead Channel Impedance Value: 437 Ohm
Lead Channel Impedance Value: 532 Ohm
Lead Channel Impedance Value: 532 Ohm
Lead Channel Impedance Value: 570 Ohm
Lead Channel Impedance Value: 570 Ohm
Lead Channel Impedance Value: 589 Ohm
Lead Channel Pacing Threshold Amplitude: 0.375 V
Lead Channel Pacing Threshold Amplitude: 0.625 V
Lead Channel Pacing Threshold Pulse Width: 0.4 ms
Lead Channel Pacing Threshold Pulse Width: 0.4 ms
Lead Channel Sensing Intrinsic Amplitude: 13 mV
Lead Channel Sensing Intrinsic Amplitude: 13 mV
Lead Channel Sensing Intrinsic Amplitude: 2.5 mV
Lead Channel Sensing Intrinsic Amplitude: 2.5 mV
Lead Channel Setting Pacing Amplitude: 1.5 V
Lead Channel Setting Pacing Amplitude: 1.5 V
Lead Channel Setting Pacing Amplitude: 2.5 V
Lead Channel Setting Pacing Pulse Width: 0.4 ms
Lead Channel Setting Pacing Pulse Width: 0.4 ms
Lead Channel Setting Sensing Sensitivity: 0.3 mV

## 2020-10-18 NOTE — Progress Notes (Signed)
Remote ICD transmission.   

## 2020-10-29 ENCOUNTER — Ambulatory Visit (HOSPITAL_COMMUNITY)
Admission: RE | Admit: 2020-10-29 | Discharge: 2020-10-29 | Disposition: A | Discharge status: Home / Self Care | Payer: Medicare Other | Source: Ambulatory Visit | Attending: Cardiology | Admitting: Cardiology

## 2020-10-29 ENCOUNTER — Encounter (HOSPITAL_COMMUNITY): Payer: Self-pay | Admitting: Cardiology

## 2020-10-29 ENCOUNTER — Other Ambulatory Visit: Payer: Self-pay

## 2020-10-29 VITALS — BP 114/70 | HR 76 | Wt 140.4 lb

## 2020-10-29 DIAGNOSIS — Z7984 Long term (current) use of oral hypoglycemic drugs: Secondary | ICD-10-CM | POA: Diagnosis not present

## 2020-10-29 DIAGNOSIS — Z7901 Long term (current) use of anticoagulants: Secondary | ICD-10-CM | POA: Insufficient documentation

## 2020-10-29 DIAGNOSIS — Z833 Family history of diabetes mellitus: Secondary | ICD-10-CM | POA: Insufficient documentation

## 2020-10-29 DIAGNOSIS — I447 Left bundle-branch block, unspecified: Secondary | ICD-10-CM | POA: Diagnosis not present

## 2020-10-29 DIAGNOSIS — E1122 Type 2 diabetes mellitus with diabetic chronic kidney disease: Secondary | ICD-10-CM | POA: Diagnosis not present

## 2020-10-29 DIAGNOSIS — I480 Paroxysmal atrial fibrillation: Secondary | ICD-10-CM | POA: Insufficient documentation

## 2020-10-29 DIAGNOSIS — Z933 Colostomy status: Secondary | ICD-10-CM | POA: Insufficient documentation

## 2020-10-29 DIAGNOSIS — Z905 Acquired absence of kidney: Secondary | ICD-10-CM | POA: Insufficient documentation

## 2020-10-29 DIAGNOSIS — I130 Hypertensive heart and chronic kidney disease with heart failure and stage 1 through stage 4 chronic kidney disease, or unspecified chronic kidney disease: Secondary | ICD-10-CM | POA: Insufficient documentation

## 2020-10-29 DIAGNOSIS — G35 Multiple sclerosis: Secondary | ICD-10-CM | POA: Insufficient documentation

## 2020-10-29 DIAGNOSIS — I428 Other cardiomyopathies: Secondary | ICD-10-CM | POA: Insufficient documentation

## 2020-10-29 DIAGNOSIS — Z87440 Personal history of urinary (tract) infections: Secondary | ICD-10-CM | POA: Diagnosis not present

## 2020-10-29 DIAGNOSIS — Z79899 Other long term (current) drug therapy: Secondary | ICD-10-CM | POA: Insufficient documentation

## 2020-10-29 DIAGNOSIS — Z85038 Personal history of other malignant neoplasm of large intestine: Secondary | ICD-10-CM | POA: Diagnosis not present

## 2020-10-29 DIAGNOSIS — N184 Chronic kidney disease, stage 4 (severe): Secondary | ICD-10-CM | POA: Diagnosis not present

## 2020-10-29 DIAGNOSIS — I5022 Chronic systolic (congestive) heart failure: Secondary | ICD-10-CM | POA: Insufficient documentation

## 2020-10-29 HISTORY — DX: Heart failure, unspecified: I50.9

## 2020-10-29 LAB — BASIC METABOLIC PANEL
Anion gap: 11 (ref 5–15)
BUN: 68 mg/dL — ABNORMAL HIGH (ref 8–23)
CO2: 20 mmol/L — ABNORMAL LOW (ref 22–32)
Calcium: 8.7 mg/dL — ABNORMAL LOW (ref 8.9–10.3)
Chloride: 106 mmol/L (ref 98–111)
Creatinine, Ser: 2.45 mg/dL — ABNORMAL HIGH (ref 0.44–1.00)
GFR, Estimated: 21 mL/min — ABNORMAL LOW (ref >60)
Glucose, Bld: 85 mg/dL (ref 70–99)
Potassium: 4.7 mmol/L (ref 3.5–5.1)
Sodium: 137 mmol/L (ref 135–145)

## 2020-10-29 MED ORDER — EMPAGLIFLOZIN 10 MG PO TABS
10.0000 mg | ORAL_TABLET | Freq: Every day | ORAL | 3 refills | Status: DC
Start: 2020-10-29 — End: 2021-10-24

## 2020-10-29 NOTE — Progress Notes (Signed)
Patient is being seen in the Advanced Heart Failure Clinic. VS, EKG, and device interrogations performed in the clinic. Dr McLean is at home and seeing patients via telemedicine as he is in quarantine.   

## 2020-10-29 NOTE — Patient Instructions (Signed)
Labs done today. We will contact you only if your labs are abnormal.  START Jardiance 10mg  (1 tablet) by mouth daily.  No other medication changes were made. Please continue all current medications as prescribed.  Your physician recommends that you schedule a follow-up appointment in: 10 days for a lab & echo appointment and in 3 months for an appointment with Dr. Aundra Dubin   If you have any questions or concerns before your next appointment please send [place] a message through [facility] or call our office at [phone].    TO LEAVE A MESSAGE FOR THE NURSE SELECT OPTION 2, PLEASE LEAVE A MESSAGE INCLUDING: . YOUR NAME . DATE OF BIRTH . CALL BACK NUMBER . REASON FOR CALL**this is important as we prioritize the call backs  YOU WILL RECEIVE A CALL BACK THE SAME DAY AS LONG AS YOU CALL BEFORE 4:00 PM   Do the following things EVERYDAY: 1) Weigh yourself in the morning before breakfast. Write it down and keep it in a log. 2) Take your medicines as prescribed 3) Eat low salt foods-Limit salt (sodium) to 2000 mg per day.  4) Stay as active as you can everyday 5) Limit all fluids for the day to less than 2 liters   At the [facility], you and your health needs are our priority. As part of our continuing mission to provide you with exceptional heart care, we have created designated Provider Care Teams. These Care Teams include your primary Cardiologist (physician) and Advanced Practice Providers (APPs- Physician Assistants and Nurse Practitioners) who all work together to provide you with the care you need, when you need it.   You may see any of the following providers on your designated Care Team at your next follow up: Marland Kitchen Dr Glori Bickers . Dr Loralie Champagne . Darrick Grinder, NP . Lyda Jester, PA . Audry Riles, PharmD   Please be sure to bring in all your medications bottles to every appointment.

## 2020-10-30 ENCOUNTER — Other Ambulatory Visit: Payer: Self-pay

## 2020-10-30 ENCOUNTER — Encounter (HOSPITAL_COMMUNITY): Payer: Self-pay | Admitting: Emergency Medicine

## 2020-10-30 ENCOUNTER — Emergency Department (HOSPITAL_COMMUNITY)
Admission: EM | Admit: 2020-10-30 | Discharge: 2020-10-30 | Disposition: A | Discharge status: Home / Self Care | Payer: Medicare Other | Attending: Emergency Medicine | Admitting: Emergency Medicine

## 2020-10-30 DIAGNOSIS — M542 Cervicalgia: Secondary | ICD-10-CM | POA: Diagnosis present

## 2020-10-30 DIAGNOSIS — Z79899 Other long term (current) drug therapy: Secondary | ICD-10-CM | POA: Diagnosis not present

## 2020-10-30 DIAGNOSIS — Z859 Personal history of malignant neoplasm, unspecified: Secondary | ICD-10-CM | POA: Diagnosis not present

## 2020-10-30 DIAGNOSIS — Z7901 Long term (current) use of anticoagulants: Secondary | ICD-10-CM | POA: Insufficient documentation

## 2020-10-30 DIAGNOSIS — Z955 Presence of coronary angioplasty implant and graft: Secondary | ICD-10-CM | POA: Diagnosis not present

## 2020-10-30 DIAGNOSIS — I5022 Chronic systolic (congestive) heart failure: Secondary | ICD-10-CM | POA: Insufficient documentation

## 2020-10-30 DIAGNOSIS — N184 Chronic kidney disease, stage 4 (severe): Secondary | ICD-10-CM | POA: Insufficient documentation

## 2020-10-30 DIAGNOSIS — M436 Torticollis: Secondary | ICD-10-CM | POA: Insufficient documentation

## 2020-10-30 DIAGNOSIS — I130 Hypertensive heart and chronic kidney disease with heart failure and stage 1 through stage 4 chronic kidney disease, or unspecified chronic kidney disease: Secondary | ICD-10-CM | POA: Diagnosis not present

## 2020-10-30 MED ORDER — METHOCARBAMOL 500 MG PO TABS
500.0000 mg | ORAL_TABLET | Freq: Two times a day (BID) | ORAL | 0 refills | Status: DC
Start: 2020-10-30 — End: 2020-12-20

## 2020-10-30 MED ORDER — ACETAMINOPHEN 500 MG PO TABS
1000.0000 mg | ORAL_TABLET | Freq: Once | ORAL | Status: AC
  Administered 2020-10-30: 1000 mg via ORAL
  Filled 2020-10-30: qty 2

## 2020-10-30 MED ORDER — METHOCARBAMOL 500 MG PO TABS
500.0000 mg | ORAL_TABLET | Freq: Once | ORAL | Status: AC
  Administered 2020-10-30: 500 mg via ORAL
  Filled 2020-10-30: qty 1

## 2020-10-30 MED ORDER — LIDOCAINE 5 % EX PTCH
1.0000 | MEDICATED_PATCH | Freq: Once | CUTANEOUS | Status: DC
  Administered 2020-10-30: 1 via TRANSDERMAL
  Filled 2020-10-30: qty 1

## 2020-10-30 NOTE — ED Triage Notes (Signed)
Pt reports neck pain x 1 week that is worse with movement.  No known injury.

## 2020-10-30 NOTE — ED Provider Notes (Signed)
[facility] EMERGENCY DEPARTMENT Provider Note   CSN: [id number] Arrival date & time: 10/30/20  1132     History Chief Complaint  Patient presents with  . Neck Pain    Jeanne Bruce is a 65 y.o. female.  Jeanne Bruce is a 65 y.o. female with a history of hypertension, CHF, colon cancer, presents to the emergency department for evaluation of neck pain.  She reports at the beginning of the week she felt like she slept on her neck wrong like she had a crick in her neck on the left side.  She states that she has continued to have spasms and discomfort on the left side of her neck last night she started having pain on both sides of her neck and feels like she cannot move her neck or turn her head.  She denies any associated injury other than waking up after feeling like she had slept funny.  No associated headache, visual changes, numbness, tingling or weakness.  Denies any prior history of neck problems or surgeries.  No lightheadedness or syncope.  She reports that she has taken Tylenol without much improvement, has not tried anything else for this.  No other aggravating relieving factors.        Past Medical History:  Diagnosis Date  . Cancer ([place])    colon (resolved)  . CHF (congestive heart failure) ([place])   . Hypertension   . Multiple sclerosis ([place])   . Renal disorder     Patient Active Problem List   Diagnosis Date Noted  . Paroxysmal atrial fibrillation ([place]) 05/14/2020  . Secondary hypercoagulable state ([place]) 05/14/2020  . Takotsubo cardiomyopathy 09/02/2019  . Chronic systolic CHF (congestive heart failure) ([place]) 09/02/2019  . DCM (dilated cardiomyopathy) ([place])   . AKI (acute kidney injury) ([place])   . Acute CVA (cerebrovascular accident) ([place]) 08/11/2019  . Acute lower UTI 08/11/2019  . Essential hypertension 08/11/2019  . Acute encephalopathy 08/11/2019  . CKD (chronic kidney disease) stage 4, GFR 15-29 ml/min (HCC) 08/11/2019  . Multiple  sclerosis ([place]) 08/11/2019  . Pressure injury of skin 08/11/2019  . Acute respiratory failure ([place])   . Torsades de pointes ([place])   . Renal insufficiency   . VF (ventricular fibrillation) [facility])     Past Surgical History:  Procedure Laterality Date  . BIV ICD INSERTION CRT-D N/A 04/05/2020   Procedure: BIV ICD INSERTION CRT-D;  Surgeon: Constance Haw, MD;  Location: [facility];  Service: Cardiovascular;  Laterality: N/A;  . LEFT HEART CATH AND CORONARY ANGIOGRAPHY N/A 08/14/2019   Procedure: LEFT HEART CATH AND CORONARY ANGIOGRAPHY;  Surgeon: Burnell Blanks, MD;  Location: [facility];  Service: Cardiovascular;  Laterality: N/A;  . NEPHRECTOMY TRANSPLANTED ORGAN       OB History   No obstetric history on file.     Family History  Problem Relation Age of Onset  . Atrial fibrillation Father   . Diabetes Mellitus II Neg Hx     Social History   Tobacco Use  . Smoking status: Never Smoker  . Smokeless tobacco: Never Used  Substance Use Topics  . Alcohol use: Not Currently  . Drug use: Never    Home Medications Prior to Admission medications   Medication Sig Start Date End Date Taking? Authorizing Provider  methocarbamol (ROBAXIN) 500 MG tablet Take 1 tablet (500 mg total) by mouth 2 (two) times daily. 10/30/20  Yes Jacqlyn Larsen, PA-C  apixaban (ELIQUIS) 5 MG TABS  tablet Take 1 tablet (5 mg total) by mouth 2 (two) times daily. 04/26/20   Larey Dresser, MD  carvedilol (COREG) 6.25 MG tablet TAKE 1 TABLET(6.25 MG) BY MOUTH TWICE DAILY WITH A MEAL 09/08/20   Larey Dresser, MD  cholecalciferol (VITAMIN D3) 25 MCG (1000 UT) tablet Take 1,000 Units by mouth in the morning and at bedtime.     [provider]  conjugated estrogens (PREMARIN) vaginal cream Place 1 Applicatorful vaginally at bedtime.    [provider]  Cyanocobalamin (VITAMIN B12 PO) Take 1 tablet by mouth daily.     [provider]  empagliflozin  (JARDIANCE) 10 MG TABS tablet Take 1 tablet (10 mg total) by mouth daily before breakfast. 10/29/20   Larey Dresser, MD  magnesium oxide (MAG-OX) 400 MG tablet Take 400 mg by mouth daily.    [provider]  Probiotic Product (PROBIOTIC DAILY PO) Take 1 capsule by mouth daily.    [provider]    Allergies    Ativan [lorazepam], Invanz [ertapenem], Mirabegron, Claritin-d 12 hour [loratadine-pseudoephedrine er], and Penicillins  Review of Systems   Review of Systems  Constitutional: Negative for chills and fever.  HENT: Negative.   Eyes: Negative for visual disturbance.  Gastrointestinal: Negative for nausea and vomiting.  Musculoskeletal: Positive for neck pain and neck stiffness.  Skin: Negative for color change and rash.  Neurological: Negative for dizziness, seizures, syncope, facial asymmetry, speech difficulty, weakness, light-headedness, numbness and headaches.  All other systems reviewed and are negative.   Physical Exam Updated Vital Signs BP 105/61 (BP Location: Right Arm)   Pulse 62   Temp 98.5 F (36.9 C) (Oral)   Resp 16   SpO2 100%   Physical Exam Vitals and nursing note reviewed.  Constitutional:      General: She is not in acute distress.    Appearance: Normal appearance. She is well-developed, normal weight and well-nourished. She is not ill-appearing or diaphoretic.     Comments: Patient appears uncomfortable, but is in no acute distress  HENT:     Head: Normocephalic and atraumatic.     Mouth/Throat:     Mouth: Oropharynx is clear and moist.  Eyes:     General:        Right eye: No discharge.        Left eye: No discharge.     Extraocular Movements: EOM normal.  Neck:     Comments: Tenderness to palpation over bilateral trapezius muscles with palpable spasm, decreased range of motion of the neck, no midline spinal tenderness or palpable deformity or step-off. Cardiovascular:     Rate and Rhythm: Normal rate and regular rhythm.      Pulses: Intact distal pulses.     Heart sounds: No murmur heard. No friction rub. No gallop.   Pulmonary:     Effort: Pulmonary effort is normal. No respiratory distress.     Breath sounds: Normal breath sounds. No wheezing or rales.  Abdominal:     General: There is no distension.  Musculoskeletal:        General: No deformity or edema.     Cervical back: Neck supple. Tenderness present.  Skin:    General: Skin is warm and dry.     Capillary Refill: Capillary refill takes less than 2 seconds.  Neurological:     Mental Status: She is alert and oriented to person, place, and time.     Coordination: Coordination normal.     Comments:  Speech is clear, able to follow commands CN III-XII intact Normal strength in upper and lower extremities bilaterally including dorsiflexion and plantar flexion, strong and equal grip strength Sensation normal to light and sharp touch Moves extremities without ataxia, coordination intact  Psychiatric:        Mood and Affect: Mood normal.        Behavior: Behavior normal.     ED Results / Procedures / Treatments   Labs (all labs ordered are listed, but only abnormal results are displayed) Labs Reviewed - No data to display  EKG None  Radiology No results found.  Procedures Procedures (including critical care time)  Medications Ordered in ED Medications  lidocaine (LIDODERM) 5 % 1 patch (has no administration in time range)  acetaminophen (TYLENOL) tablet 1,000 mg (1,000 mg Oral Given 10/30/20 1515)  methocarbamol (ROBAXIN) tablet 500 mg (500 mg Oral Given 10/30/20 1515)    ED Course  I have reviewed the triage vital signs and the nursing notes.  Pertinent labs & imaging results that were available during my care of the patient were reviewed by me and considered in my medical decision making (see chart for details).    MDM Rules/Calculators/A&P                         65 year old female presents with neck pain, on Monday felt like she  slept wrong and was developing spasm over the left side of her neck, it has continued and last night she started experiencing spasm bilaterally, no new injury to the neck.  Normal neurologic exam.  No associated headache.  Patient with palpable spasm over bilateral trapezius muscles.  Treated here in the emergency department with heat, Tylenol and Robaxin and patient is already having improvement in range of motion and pain.  We will continue to treat with these therapies at home and have her follow-up with PCP if not improving.  Return precautions discussed.  Discharged home in good condition.  Final Clinical Impression(s) / ED Diagnoses Final diagnoses:  Neck pain  Torticollis, acute    Rx / DC Orders ED Discharge Orders         Ordered    methocarbamol (ROBAXIN) 500 MG tablet  2 times daily        10/30/20 1607           Jacqlyn Larsen, [place] 10/30/20 [place], [place], DO 10/30/20 1628

## 2020-10-30 NOTE — Discharge Instructions (Addendum)
Use robaxin to help with spasm.  This can be taken twice daily, can cause drowsiness.  You can also use Tylenol 1000 mg every 6 hours   You can also apply heat and do gentle massage and stretching.  This should improve with time.  If it is not improving please see your PCP.  If you start to develop severe headache, numbness, tingling or weakness in your extremities or any other new or concerning symptoms return for reevaluation.

## 2020-10-30 NOTE — Progress Notes (Signed)
Heart Failure TeleHealth Note  Due to national recommendations of social distancing due to [place] 19, Audio/video telehealth visit is felt to be most appropriate for this patient at this time.  See MyChart message from today for patient consent regarding telehealth for [facility].  Date:  10/30/2020   ID:  Jeanne Bruce, DOB 02-02-1956, MRN [id number]  Location: Patient in the office, physician at home due to quarantine.  Provider location: [place] Type of Visit: Established patient   PCP:  Derrill Center., MD  Cardiologist:  Fransico Him, MD Primary HF: Dr. Aundra Dubin  History of Present Illness: Jeanne Bruce is a 65 y.o. female who presents via audio/video conferencing for a telehealth visit today.     she denies symptoms worrisome for COVID 19.   Patient has a history of CKD stage 4, chronic systolic CHF, multiple sclerosis, and right nephrectomy.  She was referred by Dr. Radford Pax for CHF evaluation.  Patient also had remote colon cancer with colostomy.  She has had frequent UTIs and was admitted in 10/20 with urosepsis.  She had respiratory arrest and VF arrest in the ER and was intubated.  Echo was done, showing EF 25% with apical ballooning.  LHC showed no significant coronary disease.  She ended up having right nephrectomy for renal abscess in 10/20.    Echo was repeated in 12/20, showing EF still low at 25-30% with peri-apical akinesis.   She has had a hard time taking cardiac medications due to orthostatic symptoms with low BP.  She was sent home from the hospital in 10/20 on hydralazine/Imdur but had to stop due to low BP.    Echo in 4/21 showed EF 25% with septal-lateral dyssynchrony, normal RV.  Medtronic CRT-D device was placed in 6/21.    She returns for followup of CHF.  No dyspnea walking on flat ground or up a flight of stairs.  No orthopnea/PND.  Overall, she feels very good.  No lightheadedness.  No fatigue.  No palpitations.    Medtronic device interrogation: Fluid index > threshold but thoracic impedance is heading up.  ECG (personally reviewed):  NSR, BiV paced  Labs (11/20): creatinine 1.5 Labs (12/20): creatinine 2.47 Labs (1/21): creatinine 2.1 => 2.04, BNP 54 Labs (6/21): creatinine 2.33 Labs (7/21): K 5.7, creatinine 2.61 Labs (9/21): K 4.5, creatinine 2.38  PMH: 1. H/o right nephrectomy for renal abscess in 10/20.  2. Multiple Sclerosis 3. HTN 4. H/o colon cancer: s/p colectomy with colostomy.  5. CKD: Stage 4. Sees a nephrologist at [facility].  6. Frequent UTIs.  7. Chronic systolic CHF: Nonischemic cardiomyopathy. Medtronic CRT-D device.  - Echo (10/20): EF 25%, apical ballooning.  - LHC (10/20): Normal coronaries.  - Echo (12/20): EF 25-30%, peri-apical akinesis, normal RV size and systolic function (similar to 10/20 echo).   - Echo (4/21): EF 25%, septal-lateral dyssynchrony 8. LBBB  Social History   Socioeconomic History  . Marital status: Married    Spouse name: Not on file  . Number of children: Not on file  . Years of education: Not on file  . Highest education level: Not on file  Occupational History  . Not on file  Tobacco Use  . Smoking status: Never Smoker  . Smokeless tobacco: Never Used  Substance and Sexual Activity  . Alcohol use: Not Currently  . Drug use: Never  . Sexual activity: Not on file  Other Topics Concern  . Not on file  Social History  Narrative  . Not on file   Social Determinants of Health   Financial Resource Strain: Not on file  Food Insecurity: Not on file  Transportation Needs: No Transportation Needs  . Lack of Transportation (Medical): No  . Lack of Transportation (Non-Medical): No  Physical Activity: Insufficiently Active  . Days of Exercise per Week: 2 days  . Minutes of Exercise per Session: 10 min  Stress: Not on file  Social Connections: Unknown  . Frequency of Communication with Friends and Family: More than three times a week  .  Frequency of Social Gatherings with Friends and Family: Twice a week  . Attends Religious Services: More than 4 times per year  . Active Member of Clubs or Organizations: Not on file  . Attends Archivist Meetings: Not on file  . Marital Status: Married  Human resources officer Violence: Not on file   Family History  Problem Relation Age of Onset  . Atrial fibrillation Father   . Diabetes Mellitus II Neg Hx    ROS: All systems reviewed and negative except as per HPI.   Current Outpatient Medications  Medication Sig Dispense Refill  . apixaban (ELIQUIS) 5 MG TABS tablet Take 1 tablet (5 mg total) by mouth 2 (two) times daily. 60 tablet 11  . carvedilol (COREG) 6.25 MG tablet TAKE 1 TABLET(6.25 MG) BY MOUTH TWICE DAILY WITH A MEAL 180 tablet 0  . cholecalciferol (VITAMIN D3) 25 MCG (1000 UT) tablet Take 1,000 Units by mouth in the morning and at bedtime.     . conjugated estrogens (PREMARIN) vaginal cream Place 1 Applicatorful vaginally at bedtime.    . Cyanocobalamin (VITAMIN B12 PO) Take 1 tablet by mouth daily.     . empagliflozin (JARDIANCE) 10 MG TABS tablet Take 1 tablet (10 mg total) by mouth daily before breakfast. 90 tablet 3  . magnesium oxide (MAG-OX) 400 MG tablet Take 400 mg by mouth daily.    . Probiotic Product (PROBIOTIC DAILY PO) Take 1 capsule by mouth daily.    . methocarbamol (ROBAXIN) 500 MG tablet Take 1 tablet (500 mg total) by mouth 2 (two) times daily. 20 tablet 0   No current facility-administered medications for this encounter.   Exam:   BP 114/70   Pulse 76   Wt 63.7 kg (140 lb 6.4 oz)   SpO2 100%   BMI 27.42 kg/m  Exam:  (Video/Tele Health Call; Exam is subjective and or/visual.) General:  Speaks in full sentences. No resp difficulty. Lungs: Normal respiratory effort with conversation.  Abdomen: Non-distended per patient report Extremities: Pt denies edema. Neuro: Alert & oriented x 3.   Assessment/Plan:  1. Chronic systolic CHF: Nonischemic  cardiomyopathy, LHC in 10/20 with no significant coronary disease.  Echo in 10/20 was suggestive of stress (Takotsubo-type) cardiomyopathy with apical ballooning (from severe urosepsis).  However, LV function did not improve on repeat echo in 12/20 or echo in 4/21 (EF still 25%).  ?LBBB cardiomyopathy.  She is now s/p Medtronic CRT-D device.  NYHA class II symptoms.  She has had a hard time tolerating cardiac meds due to orthostasis, but this has been improved recently.  CKD stage IV also limits medication titration.  - Off spironolactone due to hyperkalemia.    - Continue Coreg 6.25 mg bid.  - No ACEI/ARNI/ARB for now with CKD stage 4.  - Add Jardiance 10 mg daily (GFR 21 most recently). BMET today and in 10 days.  - She does not appear to need Lasix.  -  I will arrange for repeat echo at next appt.  2. CKD stage 4: S/p right nephrectomy in 10/20.  Sees nephrology at [facility]. Creatinine 2.38 most recently.  - Adding SGLT2 inhibitor as above.  3. Atrial fibrillation: Paroxysmal.  Noted after device implantation.  She is in NSR today.  - Continue  apixaban 5 mg bid.  - Follow device for atrial fibrillation burden.  May need to consider ablation or antiarrhythmic in the future, EP is following.  On device interrogation, BiV pacing percentage significantly dropped when she went into atrial fibrillation.   COVID screen The patient does not have any symptoms that suggest any further testing/ screening at this time.  Social distancing reinforced today.  Patient Risk: After full review of this patients clinical status, I feel that they are at moderate risk for cardiac decompensation at this time.  Relevant cardiac medications were reviewed at length with the patient today. The patient does not have concerns regarding their medications at this time.   Recommended follow-up:  3 months.   Today, I have spent 18 minutes with the patient with telehealth technology discussing the above issues .     Signed, Loralie Champagne, MD  10/30/2020  [facility] [address] [facility] and [place] [address] [phone] (office) [phone] (fax)

## 2020-11-09 ENCOUNTER — Other Ambulatory Visit: Payer: Self-pay

## 2020-11-09 ENCOUNTER — Ambulatory Visit (HOSPITAL_COMMUNITY)
Admission: RE | Admit: 2020-11-09 | Discharge: 2020-11-09 | Disposition: A | Discharge status: Home / Self Care | Payer: Medicare Other | Source: Ambulatory Visit | Attending: Cardiology | Admitting: Cardiology

## 2020-11-09 DIAGNOSIS — N289 Disorder of kidney and ureter, unspecified: Secondary | ICD-10-CM | POA: Insufficient documentation

## 2020-11-09 DIAGNOSIS — G35 Multiple sclerosis: Secondary | ICD-10-CM | POA: Insufficient documentation

## 2020-11-09 DIAGNOSIS — I071 Rheumatic tricuspid insufficiency: Secondary | ICD-10-CM | POA: Diagnosis not present

## 2020-11-09 DIAGNOSIS — I110 Hypertensive heart disease with heart failure: Secondary | ICD-10-CM | POA: Insufficient documentation

## 2020-11-09 DIAGNOSIS — C801 Malignant (primary) neoplasm, unspecified: Secondary | ICD-10-CM | POA: Insufficient documentation

## 2020-11-09 DIAGNOSIS — I5022 Chronic systolic (congestive) heart failure: Secondary | ICD-10-CM

## 2020-11-09 LAB — ECHOCARDIOGRAM COMPLETE
Area-P 1/2: 3.27 cm2
P 1/2 time: 422 msec
S' Lateral: 2.6 cm

## 2020-11-09 LAB — BASIC METABOLIC PANEL
Anion gap: 10 (ref 5–15)
BUN: 76 mg/dL — ABNORMAL HIGH (ref 8–23)
CO2: 24 mmol/L (ref 22–32)
Calcium: 9 mg/dL (ref 8.9–10.3)
Chloride: 104 mmol/L (ref 98–111)
Creatinine, Ser: 2.58 mg/dL — ABNORMAL HIGH (ref 0.44–1.00)
GFR, Estimated: 20 mL/min — ABNORMAL LOW (ref >60)
Glucose, Bld: 74 mg/dL (ref 70–99)
Potassium: 4.9 mmol/L (ref 3.5–5.1)
Sodium: 138 mmol/L (ref 135–145)

## 2020-11-09 NOTE — Progress Notes (Signed)
  Echocardiogram 2D Echocardiogram has been performed.  Jeanne Bruce G Damek Ende 11/09/2020, 9:05 AM

## 2020-11-12 ENCOUNTER — Telehealth: Payer: Self-pay | Admitting: Cardiology

## 2020-11-12 NOTE — Telephone Encounter (Signed)
New Message:    Pleas call Jeanne Bruce, she needs to know if pt's pacemaker is MRI compatible? She says she need to ask some other questions also please.

## 2020-11-12 NOTE — Telephone Encounter (Signed)
Returning phone call. Spoke to patient and advised ICD is compatible with MRI. Advised to call with further questions or concerns.

## 2020-11-13 ENCOUNTER — Other Ambulatory Visit: Payer: Self-pay | Admitting: Internal Medicine

## 2020-12-20 ENCOUNTER — Other Ambulatory Visit: Payer: Self-pay

## 2020-12-20 ENCOUNTER — Ambulatory Visit (INDEPENDENT_AMBULATORY_CARE_PROVIDER_SITE_OTHER): Payer: Medicare Other | Admitting: Cardiology

## 2020-12-20 ENCOUNTER — Encounter: Payer: Self-pay | Admitting: Cardiology

## 2020-12-20 DIAGNOSIS — I5022 Chronic systolic (congestive) heart failure: Secondary | ICD-10-CM

## 2020-12-20 DIAGNOSIS — I420 Dilated cardiomyopathy: Secondary | ICD-10-CM | POA: Diagnosis not present

## 2020-12-20 NOTE — Progress Notes (Signed)
Electrophysiology Office Note   Date:  12/20/2020   ID:  Jeanne Bruce, DOB 1956-07-26, MRN [id number]  PCP:  Derrill Center., MD  Cardiologist:  Aundra Dubin Primary Electrophysiologist:  Jamell Opfer Meredith Leeds, MD    Chief Complaint: CHF   History of Present Illness: Jeanne Bruce is a 65 y.o. female who is being seen today for the evaluation of CHF at the request of Derrill Center., MD. Presenting today for electrophysiology evaluation.  She has a history significant for stage IV CKD, chronic systolic heart failure due to nonischemic cardiomyopathy, multiple sclerosis, colon cancer status post colectomy.  She has had a right nephrectomy.  She was admitted to the hospital October 2020 with sepsis.  She had a respiratory arrest and a VF arrest in the emergency room and was intubated.  Echo showed an ejection fraction of 25% with apical ballooning.  Left heart catheterization showed no evidence of coronary artery disease.  She had a right nephrectomy for renal abscess during that admission.  Echo December 2020 showed a persistently low ejection fraction with apical akinesis.  She is status post Medtronic CRT-D implanted 04/05/2020.  She has a history of atrial fibrillation which was found on device interrogation and is on Eliquis.  Today, denies symptoms of palpitations, chest pain, shortness of breath, orthopnea, PND, lower extremity edema, claudication, dizziness, presyncope, syncope, bleeding, or neurologic sequela. The patient is tolerating medications without difficulties.  She currently feels well.  She has no chest pain or shortness of breath.  She Jeslyn Amsler do all of her daily activities without restriction.  Past Medical History:  Diagnosis Date  . Cancer ([place])    colon (resolved)  . CHF (congestive heart failure) ([place])   . Hypertension   . Multiple sclerosis ([place])   . Renal disorder    Past Surgical History:  Procedure Laterality Date  . BIV ICD INSERTION CRT-D N/A 04/05/2020    Procedure: BIV ICD INSERTION CRT-D;  Surgeon: Constance Haw, MD;  Location: [facility];  Service: Cardiovascular;  Laterality: N/A;  . LEFT HEART CATH AND CORONARY ANGIOGRAPHY N/A 08/14/2019   Procedure: LEFT HEART CATH AND CORONARY ANGIOGRAPHY;  Surgeon: Burnell Blanks, MD;  Location: [facility];  Service: Cardiovascular;  Laterality: N/A;  . NEPHRECTOMY TRANSPLANTED ORGAN       Current Outpatient Medications  Medication Sig Dispense Refill  . apixaban (ELIQUIS) 5 MG TABS tablet Take 1 tablet (5 mg total) by mouth 2 (two) times daily. 60 tablet 11  . carvedilol (COREG) 6.25 MG tablet TAKE 1 TABLET(6.25 MG) BY MOUTH TWICE DAILY WITH A MEAL 180 tablet 0  . cholecalciferol (VITAMIN D3) 25 MCG (1000 UT) tablet Take 1,000 Units by mouth in the morning and at bedtime.     . conjugated estrogens (PREMARIN) vaginal cream Place 1 Applicatorful vaginally at bedtime.    . Cyanocobalamin (VITAMIN B12 PO) Take 1 tablet by mouth daily.     . empagliflozin (JARDIANCE) 10 MG TABS tablet Take 1 tablet (10 mg total) by mouth daily before breakfast. 90 tablet 3  . magnesium oxide (MAG-OX) 400 MG tablet Take 400 mg by mouth daily.    . Probiotic Product (PROBIOTIC DAILY PO) Take 1 capsule by mouth daily.     No current facility-administered medications for this visit.    Allergies:   Ativan [lorazepam], Invanz [ertapenem], Mirabegron, Claritin-d 12 hour [loratadine-pseudoephedrine er], and Penicillins   Social History:  The patient  reports that she has never smoked.  She has never used smokeless tobacco. She reports previous alcohol use. She reports that she does not use drugs.   Family History:  The patient's family history includes Atrial fibrillation in her father.   ROS:  Please see the history of present illness.   Otherwise, review of systems is positive for none.   All other systems are reviewed and negative.   PHYSICAL EXAM: VS:  BP 122/74   Pulse 72   Ht 5\' 1"   (1.549 m)   Wt 141 lb (64 kg)   SpO2 96%   BMI 26.64 kg/m  , BMI Body mass index is 26.64 kg/m. GEN: Well nourished, well developed, in no acute distress  HEENT: normal  Neck: no JVD, carotid bruits, or masses Cardiac: RRR; no murmurs, rubs, or gallops,no edema  Respiratory:  clear to auscultation bilaterally, normal work of breathing GI: soft, nontender, nondistended, + BS MS: no deformity or atrophy  Skin: warm and dry, device site well healed Neuro:  Strength and sensation are intact Psych: euthymic mood, full affect  EKG:  EKG is not ordered today. Personal review of the ekg ordered 10/29/20 shows atrial sensed, ventricular paced  Personal review of the device interrogation today. Results in [place]: 06/29/2020: Hemoglobin 10.0; Platelets 157 11/09/2020: BUN 76; Creatinine, Ser 2.58; Potassium 4.9; Sodium 138    Lipid Panel     Component Value Date/Time   CHOL 160 08/11/2019 0830   TRIG 165 (H) 08/11/2019 0830   HDL 35 (L) 08/11/2019 0830   CHOLHDL 4.6 08/11/2019 0830   VLDL 33 08/11/2019 0830   LDLCALC 92 08/11/2019 0830     Wt Readings from Last 3 Encounters:  12/20/20 141 lb (64 kg)  10/30/20 138 lb 14.2 oz (63 kg)  10/29/20 140 lb 6.4 oz (63.7 kg)      Other studies Reviewed: Additional studies/ records that were reviewed today include: TTE 01/20/20  Review of the above records today demonstrates:  1. Left ventricular ejection fraction, by estimation, is 25%. The left  ventricle has severely decreased function. The left ventricle demonstrates  global hypokinesis with septal-lateral dyssynchrony consistent with LBBB.  Left ventricular diastolic  parameters are consistent with Grade I diastolic dysfunction (impaired  relaxation).  2. Right ventricular systolic function is normal. The right ventricular  size is normal. Tricuspid regurgitation signal is inadequate for assessing  PA pressure.  3. Left atrial size was mildly dilated.  4. The  mitral valve is normal in structure. Trivial mitral valve  regurgitation. No evidence of mitral stenosis.  5. The aortic valve is tricuspid. Aortic valve regurgitation is trivial.  No aortic stenosis is present.  6. The inferior vena cava is normal in size with <50% respiratory  variability, suggesting right atrial pressure of 8 mmHg.    ASSESSMENT AND PLAN:  1.  Chronic systolic heart failure due to nonischemic cardiomyopathy: Currently on optimal medical therapy.  Status post Medtronic CRT-D implanted 04/05/2020.  Device functioning appropriately.  Her OptiVol shows increasing fluid.  She does admit to a diet higher in salt over the last few weeks.  She Marrah Vanevery cut back on her salt intake.  2.  CKD stage IV: Status post nephrectomy October 2020.  Has follow-up with nephrology.  3.  Paroxysmal atrial fibrillation: CHA2DS2-VASc of 4.  On Coreg and Eliquis.  Rare episodes.  Continue with current management.  4.  Hypertension: Currently well controlled  Current medicines are reviewed at length with the patient today.   The patient  does not have concerns regarding her medicines.  The following changes were made today: None  Labs/ tests ordered today include:  No orders of the defined types were placed in this encounter.    Disposition:   FU with Edeline Greening 12 months  Signed, Burkley Dech Meredith Leeds, MD  12/20/2020 4:05 PM     [place] [address] [phone] (office) [phone] (fax)

## 2021-01-03 LAB — CUP PACEART REMOTE DEVICE CHECK
Battery Remaining Longevity: 111 mo
Battery Voltage: 3.03 V
Brady Statistic AP VP Percent: 0.03 %
Brady Statistic AP VS Percent: 0 %
Brady Statistic AS VP Percent: 98.48 %
Brady Statistic AS VS Percent: 1.48 %
Brady Statistic RA Percent Paced: 0.04 %
Brady Statistic RV Percent Paced: 1.11 %
Date Time Interrogation Session: 20220321001804
HighPow Impedance: 80 Ohm
Implantable Lead Implant Date: 20210621
Implantable Lead Implant Date: 20210621
Implantable Lead Implant Date: 20210621
Implantable Lead Location: 753858
Implantable Lead Location: 753859
Implantable Lead Location: 753860
Implantable Lead Model: 4398
Implantable Lead Model: 5076
Implantable Pulse Generator Implant Date: 20210621
Lead Channel Impedance Value: 152 Ohm
Lead Channel Impedance Value: 156.606
Lead Channel Impedance Value: 156.606
Lead Channel Impedance Value: 156.606
Lead Channel Impedance Value: 156.606
Lead Channel Impedance Value: 304 Ohm
Lead Channel Impedance Value: 304 Ohm
Lead Channel Impedance Value: 304 Ohm
Lead Channel Impedance Value: 323 Ohm
Lead Channel Impedance Value: 323 Ohm
Lead Channel Impedance Value: 342 Ohm
Lead Channel Impedance Value: 342 Ohm
Lead Channel Impedance Value: 342 Ohm
Lead Channel Impedance Value: 513 Ohm
Lead Channel Impedance Value: 513 Ohm
Lead Channel Impedance Value: 513 Ohm
Lead Channel Impedance Value: 532 Ohm
Lead Channel Impedance Value: 532 Ohm
Lead Channel Pacing Threshold Amplitude: 0.5 V
Lead Channel Pacing Threshold Amplitude: 0.625 V
Lead Channel Pacing Threshold Pulse Width: 0.4 ms
Lead Channel Pacing Threshold Pulse Width: 0.4 ms
Lead Channel Sensing Intrinsic Amplitude: 11.5 mV
Lead Channel Sensing Intrinsic Amplitude: 2.375 mV
Lead Channel Sensing Intrinsic Amplitude: 2.375 mV
Lead Channel Sensing Intrinsic Amplitude: 8.625 mV
Lead Channel Setting Pacing Amplitude: 1.5 V
Lead Channel Setting Pacing Amplitude: 1.5 V
Lead Channel Setting Pacing Amplitude: 2.5 V
Lead Channel Setting Pacing Pulse Width: 0.4 ms
Lead Channel Setting Pacing Pulse Width: 0.4 ms
Lead Channel Setting Sensing Sensitivity: 0.3 mV

## 2021-01-26 ENCOUNTER — Other Ambulatory Visit (HOSPITAL_COMMUNITY): Payer: Self-pay

## 2021-01-26 MED ORDER — CARVEDILOL 6.25 MG PO TABS: ORAL_TABLET | ORAL | 2 refills | Status: DC

## 2021-01-27 ENCOUNTER — Other Ambulatory Visit: Payer: Self-pay

## 2021-01-27 ENCOUNTER — Telehealth (HOSPITAL_COMMUNITY): Payer: Self-pay

## 2021-01-27 ENCOUNTER — Ambulatory Visit (HOSPITAL_COMMUNITY)
Admission: RE | Admit: 2021-01-27 | Discharge: 2021-01-27 | Disposition: A | Discharge status: Home / Self Care | Payer: Medicare Other | Source: Ambulatory Visit | Attending: Cardiology | Admitting: Cardiology

## 2021-01-27 VITALS — Wt 140.0 lb

## 2021-01-27 DIAGNOSIS — I5022 Chronic systolic (congestive) heart failure: Secondary | ICD-10-CM

## 2021-01-27 NOTE — Patient Instructions (Signed)
Your physician recommends that you schedule a follow-up appointment in: 6 months Please call our office in September 2022 for an appointment.  If you have any questions or concerns before your next appointment please send [place] a message through [place] or call our office at [phone].    TO LEAVE A MESSAGE FOR THE NURSE SELECT OPTION 2, PLEASE LEAVE A MESSAGE INCLUDING: . YOUR NAME . DATE OF BIRTH . CALL BACK NUMBER . REASON FOR CALL**this is important as we prioritize the call backs  Jeanne Bruce AS LONG AS YOU CALL BEFORE 4:00 PM  At the [facility], you and your health needs are our priority. As part of our continuing mission to provide you with exceptional heart care, we have created designated Provider Care Teams. These Care Teams include your primary Cardiologist (physician) and Advanced Practice Providers (APPs- Physician Assistants and Nurse Practitioners) who all work together to provide you with the care you need, when you need it.   You may see any of the following providers on your designated Care Team at your next follow up: Marland Kitchen Dr Glori Bickers . Dr Loralie Champagne . Dr Vickki Muff . Darrick Grinder, NP . Lyda Jester, [place] . Audry Riles, PharmD   Please be sure to bring in all your medications bottles to every appointment.

## 2021-01-27 NOTE — Telephone Encounter (Signed)
  Patient Consent for Virtual Visit         Jeanne Bruce has provided verbal consent on 01/27/2021 for a virtual visit (video or telephone).   CONSENT FOR VIRTUAL VISIT FOR:  Jeanne Bruce  By participating in this virtual visit I agree to the following:  I hereby voluntarily request, consent and authorize [place] and its employed or contracted physicians, Engineer, materials, nurse practitioners or other licensed health care professionals (the Practitioner), to provide me with telemedicine health care services (the "Services") as deemed necessary by the treating Practitioner. I acknowledge and consent to receive the Services by the Practitioner via telemedicine. I understand that the telemedicine visit will involve communicating with the Practitioner through live audiovisual communication technology and the disclosure of certain medical information by electronic transmission. I acknowledge that I have been given the opportunity to request an in-person assessment or other available alternative prior to the telemedicine visit and am voluntarily participating in the telemedicine visit.  I understand that I have the right to withhold or withdraw my consent to the use of telemedicine in the course of my care at any time, without affecting my right to future care or treatment, and that the Practitioner or I may terminate the telemedicine visit at any time. I understand that I have the right to inspect all information obtained and/or recorded in the course of the telemedicine visit and may receive copies of available information for a reasonable fee.  I understand that some of the potential risks of receiving the Services via telemedicine include:  Marland Kitchen Delay or interruption in medical evaluation due to technological equipment failure or disruption; . Information transmitted may not be sufficient (e.g. poor resolution of images) to allow for appropriate medical decision making by the Practitioner;  and/or  . In rare instances, security protocols could fail, causing a breach of personal health information.  Furthermore, I acknowledge that it is my responsibility to provide information about my medical history, conditions and care that is complete and accurate to the best of my ability. I acknowledge that Practitioner's advice, recommendations, and/or decision may be based on factors not within their control, such as incomplete or inaccurate data provided by me or distortions of diagnostic images or specimens that may result from electronic transmissions. I understand that the practice of medicine is not an exact science and that Practitioner makes no warranties or guarantees regarding treatment outcomes. I acknowledge that a copy of this consent can be made available to me via my patient portal ([place]), or I can request a printed copy by calling the office of [facility].    I understand that my insurance will be billed for this visit.   I have read or had this consent read to me. . I understand the contents of this consent, which adequately explains the benefits and risks of the Services being provided via telemedicine.  . I have been provided ample opportunity to ask questions regarding this consent and the Services and have had my questions answered to my satisfaction. . I give my informed consent for the services to be provided through the use of telemedicine in my medical care

## 2021-01-27 NOTE — Progress Notes (Signed)
Heart Failure TeleHealth Note  Due to national recommendations of social distancing due to [place] 19, Audio/video telehealth visit is felt to be most appropriate for this patient at this time.  See MyChart message from today for patient consent regarding telehealth for Jeanne Bruce.  Date:  01/27/2021  ID:  Jeanne Bruce, DOB [birth year], MRN [id number]  Location: Home Provider location: [facility] Type of Visit: Established patient   PCP:  Derrill Center., MD  Cardiologist:  Fransico Him, MD Primary HF: Dr. Aundra Dubin  History of Present Illness: Jeanne Bruce is a 65 y.o. female who presents via audio/video conferencing for a telehealth visit today.     she denies symptoms worrisome for COVID 19.   Patient has a history of CKD stage 4, chronic systolic CHF, multiple sclerosis, and right nephrectomy.  She was referred by Dr. Radford Pax for CHF evaluation.  Patient also had remote colon cancer with colostomy.  She has had frequent UTIs and was admitted in 10/20 with urosepsis.  She had respiratory arrest and VF arrest in the ER and was intubated.  Echo was done, showing EF 25% with apical ballooning.  LHC showed no significant coronary disease.  She ended up having right nephrectomy for renal abscess in 10/20.    Echo was repeated in 12/20, showing EF still low at 25-30% with peri-apical akinesis.   She has had a hard time taking cardiac medications due to orthostatic symptoms with low BP.  She was sent home from the [facility] in 10/20 on hydralazine/Imdur but had to stop due to low BP.    Echo in 4/21 showed EF 25% with septal-lateral dyssynchrony, normal RV.  Medtronic CRT-D device was placed in 6/21.  Repeat echo in 1/22 showed EF up to 50-55% with normal RV.   She has been doing very well recently.  No significant exertional dyspnea or chest pain.  No orthopnea/PND. No lightheadedness.  No BRBPR/melena. Potassium was elevated on BMET earlier this month.     Labs (11/20): creatinine 1.5 Labs (12/20): creatinine 2.47 Labs (1/21): creatinine 2.1 => 2.04, BNP 54 Labs (6/21): creatinine 2.33 Labs (7/21): K 5.7, creatinine 2.61 Labs (9/21): K 4.5, creatinine 2.38 Labs (4/22): K 5.5, creatinine 2.5, LDL 91  PMH: 1. H/o right nephrectomy for renal abscess in 10/20.  2. Multiple Sclerosis 3. HTN 4. H/o colon cancer: s/p colectomy with colostomy.  5. CKD: Stage 4. Sees a nephrologist at [facility].  6. Frequent UTIs.  7. Chronic systolic CHF: Nonischemic cardiomyopathy. Medtronic CRT-D device.  - Echo (10/20): EF 25%, apical ballooning.  - LHC (10/20): Normal coronaries.  - Echo (12/20): EF 25-30%, peri-apical akinesis, normal RV size and systolic function (similar to 10/20 echo).   - Echo (4/21): EF 25%, septal-lateral dyssynchrony - Echo (1/22): EF 50-55%, normal RV.  8. LBBB  Social History   Socioeconomic History  . Marital status: Married    Spouse name: Not on file  . Number of children: Not on file  . Years of education: Not on file  . Highest education level: Not on file  Occupational History  . Not on file  Tobacco Use  . Smoking status: Never Smoker  . Smokeless tobacco: Never Used  Substance and Sexual Activity  . Alcohol use: Not Currently  . Drug use: Never  . Sexual activity: Not on file  Other Topics Concern  . Not on file  Social History Narrative  . Not on file   Social Determinants  of Health   Financial Resource Strain: Not on file  Food Insecurity: Not on file  Transportation Needs: Not on file  Physical Activity: Not on file  Stress: Not on file  Social Connections: Not on file  Intimate Partner Violence: Not on file   Family History  Problem Relation Age of Onset  . Atrial fibrillation Father   . Diabetes Mellitus II Neg Hx    ROS: All systems reviewed and negative except as per HPI.   Current Outpatient Medications  Medication Sig Dispense Refill  . apixaban (ELIQUIS) 5 MG TABS tablet Take 1  tablet (5 mg total) by mouth 2 (two) times daily. 60 tablet 11  . carvedilol (COREG) 6.25 MG tablet TAKE 1 TABLET(6.25 MG) BY MOUTH TWICE DAILY WITH A MEAL 60 tablet 2  . cholecalciferol (VITAMIN D3) 25 MCG (1000 UT) tablet Take 1,000 Units by mouth in the morning and at bedtime.     . conjugated estrogens (PREMARIN) vaginal cream Place 1 Applicatorful vaginally at bedtime.    . Cyanocobalamin (VITAMIN B12 PO) Take 1 tablet by mouth daily.     . empagliflozin (JARDIANCE) 10 MG TABS tablet Take 1 tablet (10 mg total) by mouth daily before breakfast. 90 tablet 3  . magnesium oxide (MAG-OX) 400 MG tablet Take 400 mg by mouth daily.    . Probiotic Product (PROBIOTIC DAILY PO) Take 1 capsule by mouth daily.     No current facility-administered medications for this encounter.   Exam:   Wt 63.5 kg (140 lb)   BMI 26.45 kg/m  Exam:  (Video/Tele Health Call; Exam is subjective and or/visual.) General:  Speaks in full sentences. No resp difficulty. Lungs: Normal respiratory effort with conversation.  Abdomen: Non-distended per patient report Extremities: Pt denies edema. Neuro: Alert & oriented x 3.   Assessment/Plan:  1. Chronic systolic CHF: Nonischemic cardiomyopathy, LHC in 10/20 with no significant coronary disease.  Echo in 10/20 was suggestive of stress (Takotsubo-type) cardiomyopathy with apical ballooning (from severe urosepsis).  However, LV function did not improve on repeat echo in 12/20 or echo in 4/21 (EF still 25%).  ?LBBB cardiomyopathy.  She is now s/p Medtronic CRT-D device.  NYHA class II symptoms.  She has had a hard time tolerating cardiac meds due to orthostasis, but this has been improved recently.  CKD stage IV also limits medication titration.  Repeat echo in 1/22 showed EF up to 50-55%, good response to CRT.  - Off spironolactone due to hyperkalemia.  Need to set up BMET with recent elevated K.  - Continue Coreg 6.25 mg bid.  - No ACEI/ARNI/ARB for now with CKD stage 4.  -  Continue Jardiance 10 mg daily. 2. CKD stage 4: S/p right nephrectomy in 10/20.  Sees nephrology at [facility]. Creatinine 2.5 most recently.  - Continue Jardiance for renal protection.  3. Atrial fibrillation: Paroxysmal.  Noted after device implantation.   - Continue  apixaban 5 mg bid.  - Follow device for atrial fibrillation burden.  May need to consider ablation or antiarrhythmic in the future, EP is following.  On device interrogation, BiV pacing percentage significantly dropped when she went into atrial fibrillation.   COVID screen The patient does not have any symptoms that suggest any further testing/ screening at this time.  Social distancing reinforced today.  Patient Risk: After full review of this patients clinical status, I feel that they are at moderate risk for cardiac decompensation at this time.  Relevant cardiac medications were reviewed at  length with the patient today. The patient does not have concerns regarding their medications at this time.   Recommended follow-up:  3 months.   Today, I have spent 15 minutes with the patient with telehealth technology discussing the above issues .    Signed, Loralie Champagne, MD  01/27/2021  [facility] [address] [facility] and [place] [address] [phone] (office) [phone] (fax)

## 2021-02-03 IMAGING — CT CT ANGIO NECK
1 of 11 series · 6 of 33 positions shown · IV contrast (Omnipaque)
Comparison: Head CT 08/10/2019

CLINICAL DATA: Sudden onset headache with unilateral weakness.

EXAM:
CT ANGIOGRAPHY HEAD AND NECK
TECHNIQUE: Multidetector CT imaging of the head and neck was performed using
the standard protocol during bolus administration of intravenous
contrast. Multiplanar CT image reconstructions and MIPs were
obtained to evaluate the vascular anatomy. Carotid stenosis
measurements (when applicable) are obtained utilizing NASCET
criteria, using the distal internal carotid diameter as the
denominator.
CONTRAST:  100mL OMNIPAQUE IOHEXOL 350 MG/ML SOLN

[Series 11: axial thin · axial · 0.39mm/px · z∈[-301,-51]mm · 6 of 348 slices shown]
[im 50/348  soft-tissue]
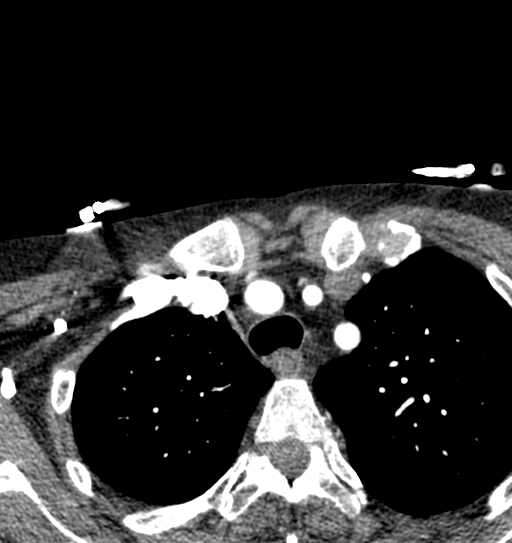
[im 100/348  bone]
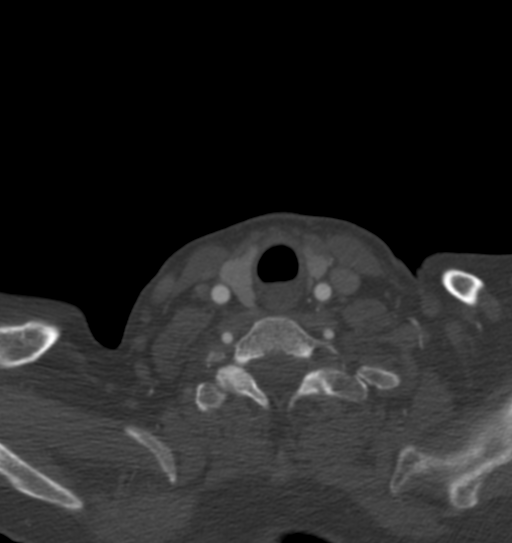
[im 149/348  soft-tissue]
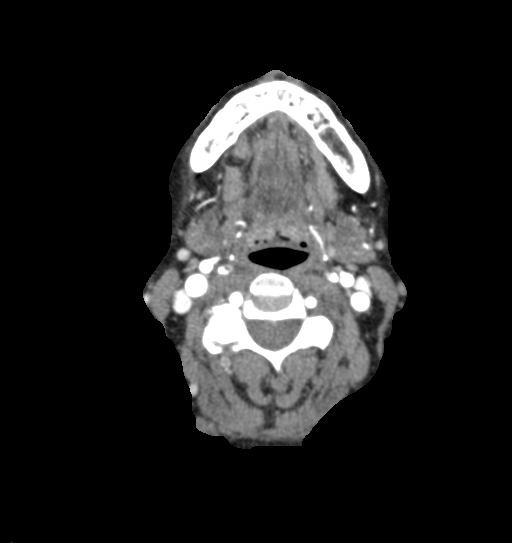
[im 199/348  bone]
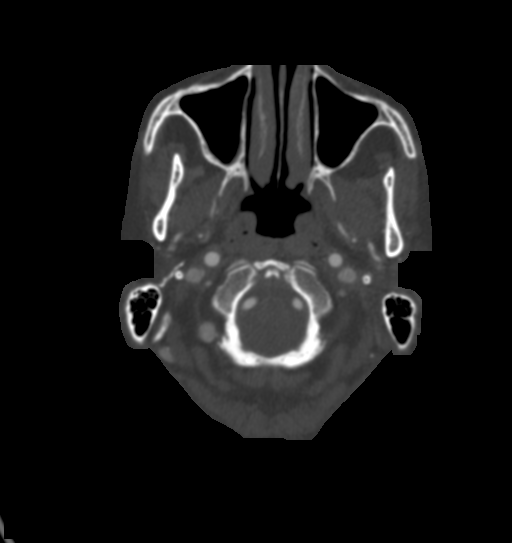
[im 248/348  soft-tissue]
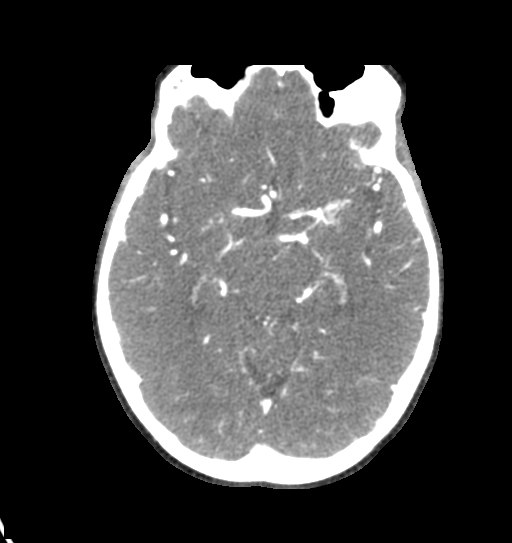
[im 298/348  bone]
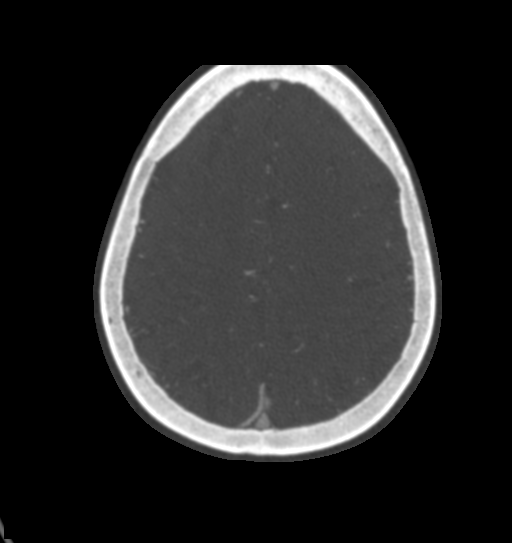

[6 of 33 positions shown; findings below may reference images not displayed]

FINDINGS: CT HEAD FINDINGS

Brain: There is no mass, hemorrhage or extra-axial collection. The
size and configuration of the ventricles and extra-axial CSF spaces
are normal. There is no acute or chronic infarction. There is
hypoattenuation of the periventricular white matter, most commonly
indicating chronic ischemic microangiopathy.

Skull: The visualized skull base, calvarium and extracranial soft
tissues are normal.

Sinuses/Orbits: No fluid levels or advanced mucosal thickening of
the visualized paranasal sinuses. No mastoid or middle ear effusion.
The orbits are normal.

CTA NECK FINDINGS

SKELETON: There is no bony spinal canal stenosis. No lytic or
blastic lesion.

OTHER NECK: Normal pharynx, larynx and major salivary glands. No
cervical lymphadenopathy. Unremarkable thyroid gland.

UPPER CHEST: No pneumothorax or pleural effusion. No nodules or
masses.

AORTIC ARCH:

There is no calcific atherosclerosis of the aortic arch. There is no
aneurysm, dissection or hemodynamically significant stenosis of the
visualized portion of the aorta. Conventional 3 vessel aortic
branching pattern. The visualized proximal subclavian arteries are
widely patent.

RIGHT CAROTID SYSTEM: Normal without aneurysm, dissection or
stenosis.

LEFT CAROTID SYSTEM: Normal without aneurysm, dissection or
stenosis.

VERTEBRAL ARTERIES: Codominant configuration. Both origins are
clearly patent. There is no dissection, occlusion or flow-limiting
stenosis to the skull base (V1-V3 segments).

CTA HEAD FINDINGS

POSTERIOR CIRCULATION:

--Vertebral arteries: Normal V4 segments.

--Posterior inferior cerebellar arteries (PICA): Patent origins from
the vertebral arteries.

--Anterior inferior cerebellar arteries (AICA): Patent origins from
the basilar artery.

--Basilar artery: Normal.

--Superior cerebellar arteries: Normal.

--Posterior cerebral arteries: Normal. Both originate from the
basilar artery. Posterior communicating arteries (p-comm) are
diminutive or absent.

ANTERIOR CIRCULATION:

--Intracranial internal carotid arteries: Normal.

--Anterior cerebral arteries (ACA): Normal. Both A1 segments are
present. Patent anterior communicating artery (a-comm).

--Middle cerebral arteries (MCA): Normal.

VENOUS SINUSES: As permitted by contrast timing, patent.

ANATOMIC VARIANTS: None

Review of the MIP images confirms the above findings.
IMPRESSION: 1. No intracranial arterial occlusion or high-grade stenosis.
2. No dissection, aneurysm or hemodynamically significant stenosis
of the major cervical or intracranial arteries.

## 2021-04-04 ENCOUNTER — Ambulatory Visit (INDEPENDENT_AMBULATORY_CARE_PROVIDER_SITE_OTHER): Payer: Medicare Other

## 2021-04-04 DIAGNOSIS — I420 Dilated cardiomyopathy: Secondary | ICD-10-CM | POA: Diagnosis not present

## 2021-04-05 LAB — CUP PACEART REMOTE DEVICE CHECK
Battery Remaining Longevity: 109 mo
Battery Voltage: 3.03 V
Brady Statistic AP VP Percent: 0.08 %
Brady Statistic AP VS Percent: 0 %
Brady Statistic AS VP Percent: 98.42 %
Brady Statistic AS VS Percent: 1.5 %
Brady Statistic RA Percent Paced: 0.08 %
Brady Statistic RV Percent Paced: 1.99 %
Date Time Interrogation Session: 20220620043723
HighPow Impedance: 68 Ohm
Implantable Lead Implant Date: 20210621
Implantable Lead Implant Date: 20210621
Implantable Lead Implant Date: 20210621
Implantable Lead Location: 753858
Implantable Lead Location: 753859
Implantable Lead Location: 753860
Implantable Lead Model: 4398
Implantable Lead Model: 5076
Implantable Pulse Generator Implant Date: 20210621
Lead Channel Impedance Value: 152 Ohm
Lead Channel Impedance Value: 152 Ohm
Lead Channel Impedance Value: 152 Ohm
Lead Channel Impedance Value: 156.606
Lead Channel Impedance Value: 156.606
Lead Channel Impedance Value: 304 Ohm
Lead Channel Impedance Value: 304 Ohm
Lead Channel Impedance Value: 304 Ohm
Lead Channel Impedance Value: 304 Ohm
Lead Channel Impedance Value: 323 Ohm
Lead Channel Impedance Value: 342 Ohm
Lead Channel Impedance Value: 342 Ohm
Lead Channel Impedance Value: 380 Ohm
Lead Channel Impedance Value: 494 Ohm
Lead Channel Impedance Value: 513 Ohm
Lead Channel Impedance Value: 513 Ohm
Lead Channel Impedance Value: 513 Ohm
Lead Channel Impedance Value: 513 Ohm
Lead Channel Pacing Threshold Amplitude: 0.5 V
Lead Channel Pacing Threshold Amplitude: 0.75 V
Lead Channel Pacing Threshold Pulse Width: 0.4 ms
Lead Channel Pacing Threshold Pulse Width: 0.4 ms
Lead Channel Sensing Intrinsic Amplitude: 2.375 mV
Lead Channel Sensing Intrinsic Amplitude: 2.375 mV
Lead Channel Sensing Intrinsic Amplitude: 9.25 mV
Lead Channel Sensing Intrinsic Amplitude: 9.25 mV
Lead Channel Setting Pacing Amplitude: 1.5 V
Lead Channel Setting Pacing Amplitude: 1.5 V
Lead Channel Setting Pacing Amplitude: 2.5 V
Lead Channel Setting Pacing Pulse Width: 0.4 ms
Lead Channel Setting Pacing Pulse Width: 0.4 ms
Lead Channel Setting Sensing Sensitivity: 0.3 mV

## 2021-04-21 NOTE — Progress Notes (Signed)
Remote ICD transmission.   

## 2021-04-24 ENCOUNTER — Other Ambulatory Visit (HOSPITAL_COMMUNITY): Payer: Self-pay | Admitting: Cardiology

## 2021-05-04 ENCOUNTER — Other Ambulatory Visit (HOSPITAL_COMMUNITY): Payer: Self-pay | Admitting: Cardiology

## 2021-07-04 ENCOUNTER — Ambulatory Visit (INDEPENDENT_AMBULATORY_CARE_PROVIDER_SITE_OTHER): Payer: Medicare Other

## 2021-07-04 DIAGNOSIS — I420 Dilated cardiomyopathy: Secondary | ICD-10-CM

## 2021-07-04 LAB — CUP PACEART REMOTE DEVICE CHECK
Battery Remaining Longevity: 107 mo
Battery Voltage: 3.03 V
Brady Statistic AP VP Percent: 0.2 %
Brady Statistic AP VS Percent: 0 %
Brady Statistic AS VP Percent: 98.1 %
Brady Statistic AS VS Percent: 1.69 %
Brady Statistic RA Percent Paced: 0.2 %
Brady Statistic RV Percent Paced: 2.21 %
Date Time Interrogation Session: 20220919001704
HighPow Impedance: 66 Ohm
Implantable Lead Implant Date: 20210621
Implantable Lead Implant Date: 20210621
Implantable Lead Implant Date: 20210621
Implantable Lead Location: 753858
Implantable Lead Location: 753859
Implantable Lead Location: 753860
Implantable Lead Model: 4398
Implantable Lead Model: 5076
Implantable Pulse Generator Implant Date: 20210621
Lead Channel Impedance Value: 141.867
Lead Channel Impedance Value: 145.871
Lead Channel Impedance Value: 145.871
Lead Channel Impedance Value: 156.606
Lead Channel Impedance Value: 156.606
Lead Channel Impedance Value: 266 Ohm
Lead Channel Impedance Value: 266 Ohm
Lead Channel Impedance Value: 304 Ohm
Lead Channel Impedance Value: 323 Ohm
Lead Channel Impedance Value: 323 Ohm
Lead Channel Impedance Value: 342 Ohm
Lead Channel Impedance Value: 342 Ohm
Lead Channel Impedance Value: 342 Ohm
Lead Channel Impedance Value: 513 Ohm
Lead Channel Impedance Value: 532 Ohm
Lead Channel Impedance Value: 532 Ohm
Lead Channel Impedance Value: 532 Ohm
Lead Channel Impedance Value: 532 Ohm
Lead Channel Pacing Threshold Amplitude: 0.5 V
Lead Channel Pacing Threshold Amplitude: 0.75 V
Lead Channel Pacing Threshold Pulse Width: 0.4 ms
Lead Channel Pacing Threshold Pulse Width: 0.4 ms
Lead Channel Sensing Intrinsic Amplitude: 11.625 mV
Lead Channel Sensing Intrinsic Amplitude: 11.625 mV
Lead Channel Sensing Intrinsic Amplitude: 2.125 mV
Lead Channel Sensing Intrinsic Amplitude: 2.125 mV
Lead Channel Setting Pacing Amplitude: 1.5 V
Lead Channel Setting Pacing Amplitude: 1.5 V
Lead Channel Setting Pacing Amplitude: 2.5 V
Lead Channel Setting Pacing Pulse Width: 0.4 ms
Lead Channel Setting Pacing Pulse Width: 0.4 ms
Lead Channel Setting Sensing Sensitivity: 0.3 mV

## 2021-07-07 NOTE — Progress Notes (Signed)
Remote ICD transmission.   

## 2021-07-24 ENCOUNTER — Other Ambulatory Visit (HOSPITAL_COMMUNITY): Payer: Self-pay | Admitting: Cardiology

## 2021-08-01 ENCOUNTER — Telehealth (HOSPITAL_COMMUNITY): Payer: Self-pay | Admitting: Cardiology

## 2021-08-01 MED ORDER — CARVEDILOL 6.25 MG PO TABS: 6.2500 mg | ORAL_TABLET | Freq: Two times a day (BID) | ORAL | 2 refills | Status: DC

## 2021-08-01 NOTE — Telephone Encounter (Signed)
Pt request carvedilol refill, please send script to [facility], Thanks

## 2021-10-03 ENCOUNTER — Ambulatory Visit (INDEPENDENT_AMBULATORY_CARE_PROVIDER_SITE_OTHER): Payer: Medicare Other

## 2021-10-03 DIAGNOSIS — I420 Dilated cardiomyopathy: Secondary | ICD-10-CM

## 2021-10-03 LAB — CUP PACEART REMOTE DEVICE CHECK
Battery Remaining Longevity: 103 mo
Battery Voltage: 3.02 V
Brady Statistic AP VP Percent: 0.19 %
Brady Statistic AP VS Percent: 0 %
Brady Statistic AS VP Percent: 98.15 %
Brady Statistic AS VS Percent: 1.65 %
Brady Statistic RA Percent Paced: 0.2 %
Brady Statistic RV Percent Paced: 2.93 %
Date Time Interrogation Session: 20221219033623
HighPow Impedance: 69 Ohm
Implantable Lead Implant Date: 20210621
Implantable Lead Implant Date: 20210621
Implantable Lead Implant Date: 20210621
Implantable Lead Location: 753858
Implantable Lead Location: 753859
Implantable Lead Location: 753860
Implantable Lead Model: 4398
Implantable Lead Model: 5076
Implantable Pulse Generator Implant Date: 20210621
Lead Channel Impedance Value: 152 Ohm
Lead Channel Impedance Value: 152 Ohm
Lead Channel Impedance Value: 152 Ohm
Lead Channel Impedance Value: 156.606
Lead Channel Impedance Value: 156.606
Lead Channel Impedance Value: 304 Ohm
Lead Channel Impedance Value: 304 Ohm
Lead Channel Impedance Value: 304 Ohm
Lead Channel Impedance Value: 304 Ohm
Lead Channel Impedance Value: 323 Ohm
Lead Channel Impedance Value: 323 Ohm
Lead Channel Impedance Value: 342 Ohm
Lead Channel Impedance Value: 399 Ohm
Lead Channel Impedance Value: 513 Ohm
Lead Channel Impedance Value: 513 Ohm
Lead Channel Impedance Value: 513 Ohm
Lead Channel Impedance Value: 532 Ohm
Lead Channel Impedance Value: 532 Ohm
Lead Channel Pacing Threshold Amplitude: 0.5 V
Lead Channel Pacing Threshold Amplitude: 0.75 V
Lead Channel Pacing Threshold Pulse Width: 0.4 ms
Lead Channel Pacing Threshold Pulse Width: 0.4 ms
Lead Channel Sensing Intrinsic Amplitude: 11.625 mV
Lead Channel Sensing Intrinsic Amplitude: 11.625 mV
Lead Channel Sensing Intrinsic Amplitude: 2.75 mV
Lead Channel Sensing Intrinsic Amplitude: 2.75 mV
Lead Channel Setting Pacing Amplitude: 1.5 V
Lead Channel Setting Pacing Amplitude: 1.5 V
Lead Channel Setting Pacing Amplitude: 2.5 V
Lead Channel Setting Pacing Pulse Width: 0.4 ms
Lead Channel Setting Pacing Pulse Width: 0.4 ms
Lead Channel Setting Sensing Sensitivity: 0.3 mV

## 2021-10-12 NOTE — Progress Notes (Signed)
Remote ICD transmission.   

## 2021-10-23 ENCOUNTER — Other Ambulatory Visit (HOSPITAL_COMMUNITY): Payer: Self-pay | Admitting: Cardiology

## 2021-10-24 ENCOUNTER — Other Ambulatory Visit (HOSPITAL_COMMUNITY): Payer: Self-pay | Admitting: Cardiology

## 2021-11-07 ENCOUNTER — Telehealth: Payer: Self-pay

## 2021-11-07 NOTE — Telephone Encounter (Signed)
Spoke with patient and agreeable to monthly follow up.  Explained will call with results after transmission is reviewed.  Provided ICM direct number and explained should call if experiencing any fluid symptoms such as weight gain, shortness of breath or extremity/abdominal swelling.  1st ICM remote transmission scheduled for 11/21/2021.

## 2021-11-07 NOTE — Telephone Encounter (Signed)
Will Meredith Leeds, MD  10/03/2021  1:01 PM EST Back to Top    Normal remote reviewed. Battery and lead parameters stable. Optivol elevated. Encourage low salt diet, lasix 20 mg daily for 3 days. Enroll in [facility] clinic.

## 2021-11-07 NOTE — Telephone Encounter (Signed)
Referred to [facility] clinic by Dr Curt Bears.   Attempted call to patient for ICM intro and no answer or voice mail option.   11/07/2021 Optivol report

## 2021-11-21 ENCOUNTER — Ambulatory Visit (INDEPENDENT_AMBULATORY_CARE_PROVIDER_SITE_OTHER): Payer: Medicare HMO

## 2021-11-21 DIAGNOSIS — I5022 Chronic systolic (congestive) heart failure: Secondary | ICD-10-CM

## 2021-11-21 DIAGNOSIS — Z95810 Presence of automatic (implantable) cardiac defibrillator: Secondary | ICD-10-CM

## 2021-11-22 ENCOUNTER — Other Ambulatory Visit (HOSPITAL_COMMUNITY): Payer: Self-pay | Admitting: Cardiology

## 2021-11-22 ENCOUNTER — Telehealth: Payer: Self-pay

## 2021-11-22 NOTE — Telephone Encounter (Signed)
Remote ICM transmission received.  Attempted call to patient regarding ICM remote transmission and mail box is full. 

## 2021-11-22 NOTE — Progress Notes (Signed)
Spoke with patient and heart failure questions reviewed.  Pt asymptomatic for fluid accumulation.  She has a cold at this time and following up with PCP.  Weight today at home 150 lbs.  Transmission reviewed. No changes and encouraged to call if experiencing any fluid symptoms.

## 2021-11-22 NOTE — Progress Notes (Signed)
EPIC Encounter for ICM Monitoring  Patient Name: Jeanne Bruce is a 66 y.o. female Date: 11/22/2021 Primary Care Physican: Derrill Center., MD Primary Cardiologist: Aundra Dubin Electrophysiologist: Curt Bears Bi-V Pacing: 98.3%  Effective 51.6% 05/31/2021 Office Weight: 154 lbs        1st ICM Remote Transmission.  Attempted call to patient and unable to reach.  Transmission reviewed.    Optivol thoracic impedance normal.   Prescribed: No diuretic prescribed  Labs: 06/15/2021 Creatinine 2.78, BUN 55, Potassium 4.0, Sodium 141, GFR 18  05/26/2021 Creatinine 2.89, BUN 66, Potassium 4.1, Sodium 140, GFR 17  01/25/2021 Creatinine 2.50, BUN 54, Potassium 5.5, Sodium 140, GFR 21  A complete set of results can be found in Results Review.  Recommendations: Unable to reach.    Follow-up plan: ICM clinic phone appointment on 12/26/2021.   91 day device clinic remote transmission 01/02/2022.    EP/Cardiology Office Visits: Recall 12/15/2021 with Dr. Curt Bears.   01/27/2021 was last visit with Dr Aundra Dubin and recommended 3 month f/u for July 2022.  No recall for Advanced HF clinic.  Copy of ICM check sent to Dr. Curt Bears.   3 month ICM trend: 11/21/2021.    12-14 Month ICM trend:     Rosalene Billings, RN 11/22/2021 3:44 PM

## 2021-12-26 ENCOUNTER — Ambulatory Visit (INDEPENDENT_AMBULATORY_CARE_PROVIDER_SITE_OTHER): Payer: Medicare HMO

## 2021-12-26 DIAGNOSIS — I5022 Chronic systolic (congestive) heart failure: Secondary | ICD-10-CM

## 2021-12-26 DIAGNOSIS — Z95810 Presence of automatic (implantable) cardiac defibrillator: Secondary | ICD-10-CM

## 2021-12-27 ENCOUNTER — Telehealth: Payer: Self-pay

## 2021-12-27 NOTE — Progress Notes (Signed)
EPIC Encounter for ICM Monitoring ? ?Patient Name: Jeanne Bruce is a 66 y.o. female ?Date: 12/27/2021 ?Primary Care Physican: Derrill Center., MD ?Primary Cardiologist: Aundra Dubin ?Electrophysiologist: Camnitz ?Bi-V Pacing: 98.3%  Effective 78.6% ?05/31/2021 Office Weight: 154 lbs  ?                                                         ?  ?1st ICM Remote Transmission.  Attempted call to patient and unable to reach.  Transmission reviewed.  ?  ?Optivol thoracic impedance suggesting possible fluid accumulation starting 3/9.  Also suggesting possible fluid accumulation from 2/5-3/5.  ?  ?Prescribed: No diuretic prescribed ?  ?Labs: ?06/15/2021 Creatinine 2.78, BUN 55, Potassium 4.0, Sodium 141, GFR 18  ?05/26/2021 Creatinine 2.89, BUN 66, Potassium 4.1, Sodium 140, GFR 17  ?01/25/2021 Creatinine 2.50, BUN 54, Potassium 5.5, Sodium 140, GFR 21  ?A complete set of results can be found in Results Review. ?  ?Recommendations: Unable to reach.   ?  ?Follow-up plan: ICM clinic phone appointment on 01/02/2022 to recheck fluid levels   91 day device clinic remote transmission 01/02/2022.   ?  ?EP/Cardiology Office Visits:  01/09/2022 with Dr. Curt Bears.   01/27/2021 was last visit with Dr Aundra Dubin and recommended 3 month f/u for July 2022.  No recall for Advanced HF clinic. ?  ?Copy of ICM check sent to Dr. Curt Bears.  Will send to Dr Aundra Dubin if patient is reached. ?  ?3 month ICM trend: 12/26/2021. ? ? ? ?12-14 Month ICM trend:  ? ? ? ?Rosalene Billings, RN ?12/27/2021 ?3:35 PM ? ?

## 2021-12-27 NOTE — Telephone Encounter (Signed)
Remote ICM transmission received.  Attempted call to patient regarding ICM remote transmission and no answer or voice mail option.  

## 2021-12-30 ENCOUNTER — Ambulatory Visit (INDEPENDENT_AMBULATORY_CARE_PROVIDER_SITE_OTHER): Payer: Medicare HMO

## 2021-12-30 DIAGNOSIS — I5022 Chronic systolic (congestive) heart failure: Secondary | ICD-10-CM

## 2021-12-30 DIAGNOSIS — Z95810 Presence of automatic (implantable) cardiac defibrillator: Secondary | ICD-10-CM

## 2021-12-30 NOTE — Progress Notes (Signed)
EPIC Encounter for ICM Monitoring ? ?Patient Name: Jeanne Bruce is a 66 y.o. female ?Date: 12/30/2021 ?Primary Care Physican: Derrill Center., MD ?Primary Cardiologist: Aundra Dubin ?Electrophysiologist: Camnitz ?Bi-V Pacing: 98.3%  Effective 77.8% ?05/31/2021 Office Weight: 154 lbs  ?                                                         ?  ?Transmission reviewed.  ?  ?Optivol thoracic impedance suggesting fluid levels returned to normal.  ?  ?Prescribed: No diuretic prescribed ?  ?Labs: ?06/15/2021 Creatinine 2.78, BUN 55, Potassium 4.0, Sodium 141, GFR 18  ?05/26/2021 Creatinine 2.89, BUN 66, Potassium 4.1, Sodium 140, GFR 17  ?01/25/2021 Creatinine 2.50, BUN 54, Potassium 5.5, Sodium 140, GFR 21  ?A complete set of results can be found in Results Review. ?  ?Recommendations: No changes. ?  ?Follow-up plan: ICM clinic phone appointment on 01/30/2022   91 day device clinic remote transmission 04/03/2022.   ?  ?EP/Cardiology Office Visits:  01/09/2022 with Dr. Curt Bears.   01/27/2021 was last visit with Dr Aundra Dubin and recommended 3 month f/u for July 2022.  No recall for Advanced HF clinic. ?  ?Copy of ICM check sent to Dr. Curt Bears. ? ?3 month ICM trend: 12/30/2021. ? ? ? ?12-14 Month ICM trend:  ? ? ? ?Rosalene Billings, RN ?12/30/2021 ?10:21 AM ? ?

## 2022-01-02 ENCOUNTER — Ambulatory Visit (INDEPENDENT_AMBULATORY_CARE_PROVIDER_SITE_OTHER): Payer: Medicare HMO

## 2022-01-02 DIAGNOSIS — I420 Dilated cardiomyopathy: Secondary | ICD-10-CM | POA: Diagnosis not present

## 2022-01-02 LAB — CUP PACEART REMOTE DEVICE CHECK
Battery Remaining Longevity: 101 mo
Battery Voltage: 3.01 V
Brady Statistic AP VP Percent: 0.16 %
Brady Statistic AP VS Percent: 0 %
Brady Statistic AS VP Percent: 98.42 %
Brady Statistic AS VS Percent: 1.42 %
Brady Statistic RA Percent Paced: 0.17 %
Brady Statistic RV Percent Paced: 1.2 %
Date Time Interrogation Session: 20230320012204
HighPow Impedance: 66 Ohm
Implantable Lead Implant Date: 20210621
Implantable Lead Implant Date: 20210621
Implantable Lead Implant Date: 20210621
Implantable Lead Location: 753858
Implantable Lead Location: 753859
Implantable Lead Location: 753860
Implantable Lead Model: 4398
Implantable Lead Model: 5076
Implantable Pulse Generator Implant Date: 20210621
Lead Channel Impedance Value: 152 Ohm
Lead Channel Impedance Value: 156.606
Lead Channel Impedance Value: 156.606
Lead Channel Impedance Value: 156.606
Lead Channel Impedance Value: 161.5 Ohm
Lead Channel Impedance Value: 304 Ohm
Lead Channel Impedance Value: 304 Ohm
Lead Channel Impedance Value: 304 Ohm
Lead Channel Impedance Value: 323 Ohm
Lead Channel Impedance Value: 323 Ohm
Lead Channel Impedance Value: 342 Ohm
Lead Channel Impedance Value: 342 Ohm
Lead Channel Impedance Value: 342 Ohm
Lead Channel Impedance Value: 513 Ohm
Lead Channel Impedance Value: 532 Ohm
Lead Channel Impedance Value: 532 Ohm
Lead Channel Impedance Value: 532 Ohm
Lead Channel Impedance Value: 532 Ohm
Lead Channel Pacing Threshold Amplitude: 0.375 V
Lead Channel Pacing Threshold Amplitude: 0.75 V
Lead Channel Pacing Threshold Pulse Width: 0.4 ms
Lead Channel Pacing Threshold Pulse Width: 0.4 ms
Lead Channel Sensing Intrinsic Amplitude: 14.875 mV
Lead Channel Sensing Intrinsic Amplitude: 14.875 mV
Lead Channel Sensing Intrinsic Amplitude: 2.25 mV
Lead Channel Sensing Intrinsic Amplitude: 2.25 mV
Lead Channel Setting Pacing Amplitude: 1.5 V
Lead Channel Setting Pacing Amplitude: 1.5 V
Lead Channel Setting Pacing Amplitude: 2.5 V
Lead Channel Setting Pacing Pulse Width: 0.4 ms
Lead Channel Setting Pacing Pulse Width: 0.4 ms
Lead Channel Setting Sensing Sensitivity: 0.3 mV

## 2022-01-07 ENCOUNTER — Other Ambulatory Visit: Payer: Self-pay

## 2022-01-07 ENCOUNTER — Emergency Department (HOSPITAL_BASED_OUTPATIENT_CLINIC_OR_DEPARTMENT_OTHER): Payer: Medicare HMO

## 2022-01-07 ENCOUNTER — Encounter (HOSPITAL_BASED_OUTPATIENT_CLINIC_OR_DEPARTMENT_OTHER): Payer: Self-pay | Admitting: *Deleted

## 2022-01-07 ENCOUNTER — Emergency Department (HOSPITAL_BASED_OUTPATIENT_CLINIC_OR_DEPARTMENT_OTHER)
Admission: EM | Admit: 2022-01-07 | Discharge: 2022-01-07 | Disposition: A | Discharge status: Home / Self Care | Payer: Medicare HMO | Attending: Emergency Medicine | Admitting: Emergency Medicine

## 2022-01-07 DIAGNOSIS — B309 Viral conjunctivitis, unspecified: Secondary | ICD-10-CM | POA: Diagnosis not present

## 2022-01-07 DIAGNOSIS — S99921A Unspecified injury of right foot, initial encounter: Secondary | ICD-10-CM | POA: Diagnosis present

## 2022-01-07 DIAGNOSIS — W108XXA Fall (on) (from) other stairs and steps, initial encounter: Secondary | ICD-10-CM | POA: Insufficient documentation

## 2022-01-07 DIAGNOSIS — S93601A Unspecified sprain of right foot, initial encounter: Secondary | ICD-10-CM | POA: Insufficient documentation

## 2022-01-07 MED ORDER — FLUORESCEIN SODIUM 1 MG OP STRP
1.0000 | ORAL_STRIP | Freq: Once | OPHTHALMIC | Status: AC
  Administered 2022-01-07: 1 via OPHTHALMIC
  Filled 2022-01-07: qty 1

## 2022-01-07 MED ORDER — TETRACAINE HCL 0.5 % OP SOLN
1.0000 [drp] | Freq: Once | OPHTHALMIC | Status: AC
  Administered 2022-01-07: 1 [drp] via OPHTHALMIC
  Filled 2022-01-07: qty 4

## 2022-01-07 NOTE — Discharge Instructions (Signed)
Your imaging today was negative for fracture of your ankle and foot.  I have given you a boot to take home which she can wear as needed over the next several days for comfort when you are walking around.  If you are at rest, you can take the boot off and elevated on some pillows.  Continue taking Tylenol and Motrin as needed for your pain.  You may notice that your pain will linger for up to a couple of weeks. ? ?Your eye did not have any abrasions on exam today. It looks to be a viral conjunctivitis, and you may notice that your right eye will become infected as well. I have attached some information here for you at home care and symptom management. If it does develop into a bacterial infection, please see your PCP.  ?

## 2022-01-07 NOTE — ED Triage Notes (Addendum)
Pt reports she missed last step going down stairs, twisted right ankle and fell. C/o pain in right ankle and has abrasion to left pinky. Also c/o left eye painful, red and itching since yesterday (unrelated to fall). Pt takes eliquis, denies hitting her head ?

## 2022-01-07 NOTE — ED Notes (Signed)
ED Provider at bedside. 

## 2022-01-07 NOTE — ED Provider Notes (Signed)
?[facility] EMERGENCY DEPARTMENT ?Provider Note ? ? ?CSN: [id number] ?Arrival date & time: 01/07/22  1520 ? ?  ? ?History ? ?Chief Complaint  ?Patient presents with  ? Fall  ? ? ?Jeanne Bruce is a 66 y.o. female who presents to the ED after a mechanical fall that occurred while she was at a restaurant earlier today.  Patient states that she was descending the stairs and skipped the last step causing her to fall.  She is unsure of the mechanism of injury and whether or not she twisted her foot or not.  She was able to stand up immediately after the incident although has been having increasing pain.  She took some Tylenol without any improvement.  She endorses swelling of the ankle and over the top of her foot. ? ?Patient also states that while she was here, she would like to have her left eye looked at. Per patient, she felt that something was in her left eye yesterday and was scratching incessantly at it and now feels that the eye is irritated.  She states that it was matted shut when she woke up this morning and is now red and pruritic.  She denies vision changes, fevers. ? ? ?Fall ? ? ?  ? ?Home Medications ?Prior to Admission medications   ?Medication Sig Start Date End Date Taking? Authorizing Provider  ?carvedilol (COREG) 6.25 MG tablet Take 1 tablet (6.25 mg total) by mouth 2 (two) times daily with a meal. NEEDS APPOINTMENT FOR ANYMORE REFILLS 11/23/21   Larey Dresser, MD  ?cholecalciferol (VITAMIN D3) 25 MCG (1000 UT) tablet Take 1,000 Units by mouth in the morning and at bedtime.     [provider]  ?conjugated estrogens (PREMARIN) vaginal cream Place 1 Applicatorful vaginally at bedtime.    [provider]  ?Cyanocobalamin (VITAMIN B12 PO) Take 1 tablet by mouth daily.     [provider]  ?ELIQUIS 5 MG TABS tablet TAKE 1 TABLET(5 MG) BY MOUTH TWICE DAILY 05/04/21   Larey Dresser, MD  ?empagliflozin (JARDIANCE) 10 MG TABS tablet Take 1 tablet (10 mg total) by mouth  daily. NEEDS OFFICE VISIT FOR MORE REFILLS 10/24/21   Larey Dresser, MD  ?magnesium oxide (MAG-OX) 400 MG tablet Take 400 mg by mouth daily.    [provider]  ?Probiotic Product (PROBIOTIC DAILY PO) Take 1 capsule by mouth daily.    [provider]  ?   ? ?Allergies    ?Ativan [lorazepam], Invanz [ertapenem], Mirabegron, Claritin-d 12 hour [loratadine-pseudoephedrine er], and Penicillins   ? ?Review of Systems   ?Review of Systems ? ?Physical Exam ?Updated Vital Signs ?BP (!) 119/56 (BP Location: Left Arm)   Pulse 65   Temp 98.4 ?F (36.9 ?C) (Oral)   Resp 17   Ht '5\' 1"'$  (1.549 m)   Wt 72.6 kg   SpO2 100%   BMI 30.23 kg/m?  ?Physical Exam ?Vitals and nursing note reviewed.  ?Constitutional:   ?   General: She is not in acute distress. ?   Appearance: She is not ill-appearing.  ?HENT:  ?   Head: Atraumatic.  ?Eyes:  ?   Conjunctiva/sclera: Conjunctivae normal.  ?Cardiovascular:  ?   Rate and Rhythm: Normal rate and regular rhythm.  ?   Pulses: Normal pulses.     ?     Radial pulses are 2+ on the right side and 2+ on the left side.  ?     Dorsalis pedis pulses  are 2+ on the right side and 2+ on the left side.  ?   Heart sounds: No murmur heard. ?Pulmonary:  ?   Effort: Pulmonary effort is normal. No respiratory distress.  ?   Breath sounds: Normal breath sounds.  ?Abdominal:  ?   General: Abdomen is flat. There is no distension.  ?   Palpations: Abdomen is soft.  ?   Tenderness: There is no abdominal tenderness.  ?Musculoskeletal:     ?   General: Normal range of motion.  ?   Cervical back: Normal range of motion.  ?   Comments: Focal tenderness over the dorsal second and third metatarsal of the right foot.  Mild soft tissue swelling of the right ankle.  ?Skin: ?   General: Skin is warm and dry.  ?   Capillary Refill: Capillary refill takes less than 2 seconds.  ?Neurological:  ?   General: No focal deficit present.  ?   Mental Status: She is alert.  ?Psychiatric:     ?   Mood and Affect:  Mood normal.  ? ? ?ED Results / Procedures / Treatments   ?Labs ?(all labs ordered are listed, but only abnormal results are displayed) ?Labs Reviewed - No data to display ? ?EKG ?None ? ?Radiology ?DG Ankle Complete Right ? ?Result Date: 01/07/2022 ?CLINICAL DATA:  [place] downstairs, twisted ankle, pain and swelling EXAM: RIGHT ANKLE - COMPLETE 3+ VIEW COMPARISON:  None. FINDINGS: Frontal, oblique, lateral views of the right ankle are obtained. No fracture, subluxation, or dislocation. Joint spaces are relatively well preserved. Prominent superior and inferior calcaneal spurs. Mild diffuse soft tissue swelling greatest anteriorly and laterally. IMPRESSION: 1. Soft tissue swelling.  No acute fracture. Electronically Signed   By: Randa Ngo M.D.   On: 01/07/2022 16:01  ? ?DG Foot Complete Right ? ?Result Date: 01/07/2022 ?CLINICAL DATA:  Fell, dorsal metatarsal tenderness EXAM: RIGHT FOOT COMPLETE - 3+ VIEW COMPARISON:  None. FINDINGS: Frontal, oblique, and lateral views of the right foot are obtained. No acute fracture, subluxation, or dislocation. Joint spaces are well preserved. Prominent superior and inferior calcaneal spurs. Soft tissues are unremarkable. IMPRESSION: 1. No acute bony abnormality. Electronically Signed   By: Randa Ngo M.D.   On: 01/07/2022 16:19   ? ?Procedures ?Procedures  ? ?Medications Ordered in ED ?Medications  ?fluorescein ophthalmic strip 1 strip (1 strip Left Eye Given by Other 01/07/22 1614)  ?tetracaine (PONTOCAINE) 0.5 % ophthalmic solution 1 drop (1 drop Left Eye Given by Other 01/07/22 1614)  ? ? ?ED Course/ Medical Decision Making/ A&P ?  ?                        ?Medical Decision Making ?Amount and/or Complexity of Data Reviewed ?Radiology: ordered. ? ?Risk ?Prescription drug management. ? ? ?History:  ?Per HPI ?Social determinants of health: none ? ?Initial impression: ? ?This patient presents to the ED for concern of ankle injury and left eye redness, this involves an  extensive number of treatment options, and is a complaint that carries with it a high risk of complications and morbidity.    ? ?Patient's physical exam overall benign.  Mild swelling over the right ankle and tenderness of the dorsal right foot.  She is ambulatory at the scene although with pain.  Imaging was negative for acute fractures, there was some soft tissue swelling noted.  No abrasion seen on fluorescein stain of the left eye.  Symptoms are likely  viral conjunctivitis given the clinical impression.  Low concern for bacterial infection given the lack of purulent discharge or significant injection. ? ? ?Imaging Studies ordered: ? ?I ordered imaging studies including  ?X-ray of the right foot and ankle without fracture ?I independently visualized and interpreted imaging and I agree with the radiologist interpretation.  ? ? ? ?Disposition: ? ?After consideration of the diagnostic results, physical exam, history and the patients response to treatment feel that the patent would benefit from discharge.   ?Sprain of right foot: Boot placed. Patient declines crutches. Home supportive care measures discussed. RICE protocol indicated.  ?Viral conjunctivitis of left eye: Discussed home supportive care measures. Patient to follow up with PCP if symptoms worsen or do not improve. ? ? ?Final Clinical Impression(s) / ED Diagnoses ?Final diagnoses:  ?Sprain of right foot, initial encounter  ?Viral conjunctivitis of left eye  ? ? ?Rx / DC Orders ?ED Discharge Orders   ? ? None  ? ?  ? ? ?  ?Tonye Pearson, [place] ?01/07/22 1729 ? ?  ?Drenda Freeze, MD ?01/07/22 2255 ? ?

## 2022-01-09 ENCOUNTER — Other Ambulatory Visit: Payer: Self-pay

## 2022-01-09 ENCOUNTER — Encounter: Payer: Self-pay | Admitting: Cardiology

## 2022-01-09 ENCOUNTER — Ambulatory Visit (INDEPENDENT_AMBULATORY_CARE_PROVIDER_SITE_OTHER): Payer: Medicare HMO | Admitting: Cardiology

## 2022-01-09 VITALS — BP 106/74 | HR 76 | Ht 61.0 in | Wt 160.0 lb

## 2022-01-09 DIAGNOSIS — I4901 Ventricular fibrillation: Secondary | ICD-10-CM | POA: Diagnosis not present

## 2022-01-09 DIAGNOSIS — I5022 Chronic systolic (congestive) heart failure: Secondary | ICD-10-CM | POA: Diagnosis not present

## 2022-01-09 NOTE — Progress Notes (Signed)
? ?Electrophysiology Office Note ? ? ?Date:  01/09/2022  ? ?IDAraya Bruce, DOB 11/05/1955, MRN [id number] ? ?PCP:  Derrill Center., MD  ?Cardiologist:  Aundra Dubin ?Primary Electrophysiologist:  Donabelle Molden Meredith Leeds, MD   ? ?Chief Complaint: CHF ?  ?History of Present Illness: ?Jeanne Bruce is a 66 y.o. female who is being seen today for the evaluation of CHF at the request of Kathlen Mody, Quin Hoop., MD. Presenting today for electrophysiology evaluation. ? ?She has a history significant for stage IV CKD, chronic [place] heart failure due to nonischemic cardiomyopathy, multiple sclerosis, colon cancer status post colectomy.  She had a right nephrectomy.  She was admitted to the hospital October 2020 with sepsis.  She had respiratory arrest and VF arrest in the emergency room and was intubated.  Echo showed an ejection fraction of 25% with apical ballooning.  Left heart catheterization showed no evidence of coronary artery disease.  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  Echo December 2020 showed a persistently low ejection fraction with apical akinesis.  She is status post Medtronic CRT-D implanted 04/05/2020.  She has atrial fibrillation found on her device and is on Eliquis. ? ?Today, denies symptoms of palpitations, chest pain, shortness of breath, orthopnea, PND, lower extremity edema, claudication, dizziness, presyncope, syncope, bleeding, or neurologic sequela. The patient is tolerating medications without difficulties.  Over the last year she has done well.  She has no chest pain or shortness of breath.  She is able to all her daily activities without restriction.  Unfortunately she had a fall this past weekend and sprained her ankle and she is in a walking boot today.  Aside from that she has no major complaints. ? ?Past Medical History:  ?Diagnosis Date  ? Cancer [facility])   ? colon (resolved)  ? CHF (congestive heart failure) ([place])   ? Hypertension   ? Multiple sclerosis ([place])   ? Renal disorder    ? ?Past Surgical History:  ?Procedure Laterality Date  ? BIV ICD INSERTION CRT-D N/A 04/05/2020  ? Procedure: BIV ICD INSERTION CRT-D;  Surgeon: Constance Haw, MD;  Location: [place] CV LAB;  Service: Cardiovascular;  Laterality: N/A;  ? LEFT HEART CATH AND CORONARY ANGIOGRAPHY N/A 08/14/2019  ? Procedure: LEFT HEART CATH AND CORONARY ANGIOGRAPHY;  Surgeon: Burnell Blanks, MD;  Location: [place] CV LAB;  Service: Cardiovascular;  Laterality: N/A;  ? NEPHRECTOMY TRANSPLANTED ORGAN    ? ? ? ?Current Outpatient Medications  ?Medication Sig Dispense Refill  ? carvedilol (COREG) 6.25 MG tablet Take 1 tablet (6.25 mg total) by mouth 2 (two) times daily with a meal. NEEDS APPOINTMENT FOR ANYMORE REFILLS 60 tablet 0  ? cholecalciferol (VITAMIN D3) 25 MCG (1000 UT) tablet Take 1,000 Units by mouth in the morning and at bedtime.     ? Cyanocobalamin (VITAMIN B12 PO) Take 1 tablet by mouth daily.     ? ELIQUIS 5 MG TABS tablet TAKE 1 TABLET(5 MG) BY MOUTH TWICE DAILY 60 tablet 11  ? empagliflozin (JARDIANCE) 10 MG TABS tablet Take 1 tablet (10 mg total) by mouth daily. NEEDS OFFICE VISIT FOR MORE REFILLS 90 tablet 3  ? magnesium oxide (MAG-OX) 400 MG tablet Take 400 mg by mouth daily.    ? Probiotic Product (PROBIOTIC DAILY PO) Take 1 capsule by mouth daily.    ? ?No current facility-administered medications for this visit.  ? ? ?Allergies:   Ativan [lorazepam], Invanz [ertapenem], Mirabegron, Claritin-d  12 hour [loratadine-pseudoephedrine er], and Penicillins  ? ?Social History:  The patient  reports that she has never smoked. She has never used smokeless tobacco. She reports that she does not currently use alcohol. She reports that she does not use drugs.  ? ?Family History:  The patient's family history includes Atrial fibrillation in her father.  ? ?ROS:  Please see the history of present illness.   Otherwise, review of systems is positive for none.   All other systems are reviewed and negative.   ? ?PHYSICAL EXAM: ?VS:  BP 106/74   Pulse 76   Ht '5\' 1"'$  (1.549 m)   Wt 160 lb (72.6 kg)   SpO2 93%   BMI 30.23 kg/m?  , BMI Body mass index is 30.23 kg/m?. ?GEN: Well nourished, well developed, in no acute distress  ?HEENT: normal  ?Neck: no JVD, carotid bruits, or masses ?Cardiac: RRR; no murmurs, rubs, or gallops,no edema  ?Respiratory:  clear to auscultation bilaterally, normal work of breathing ?GI: soft, nontender, nondistended, + BS ?MS: no deformity or atrophy  ?Skin: warm and dry, device site well healed ?Neuro:  Strength and sensation are intact ?Psych: euthymic mood, full affect ? ?EKG:  EKG is ordered today. ?Personal review of the ekg ordered shows A sense, V paced ? ?Personal review of the device interrogation today. Results in [place]  ? ?Recent Labs: ?No results found for requested labs within last 8760 hours.  ? ? ?Lipid Panel  ?   ?Component Value Date/Time  ? CHOL 160 08/11/2019 0830  ? TRIG 165 (H) 08/11/2019 0830  ? HDL 35 (L) 08/11/2019 0830  ? CHOLHDL 4.6 08/11/2019 0830  ? VLDL 33 08/11/2019 0830  ? [place] 92 08/11/2019 0830  ? ? ? ?Wt Readings from Last 3 Encounters:  ?01/09/22 160 lb (72.6 kg)  ?01/07/22 160 lb (72.6 kg)  ?01/27/21 140 lb (63.5 kg)  ?  ? ? ?Other studies Reviewed: ?Additional studies/ records that were reviewed today include: TTE 01/20/20  ?Review of the above records today demonstrates:  ? 1. Left ventricular ejection fraction, by estimation, is 25%. The left  ?ventricle has severely decreased function. The left ventricle demonstrates  ?global hypokinesis with septal-lateral dyssynchrony consistent with LBBB.  ?Left ventricular diastolic  ?parameters are consistent with Grade I diastolic dysfunction (impaired  ?relaxation).  ? 2. Right ventricular systolic function is normal. The right ventricular  ?size is normal. Tricuspid regurgitation signal is inadequate for assessing  ?PA pressure.  ? 3. Left atrial size was mildly dilated.  ? 4. The mitral valve is normal in  structure. Trivial mitral valve  ?regurgitation. No evidence of mitral stenosis.  ? 5. The aortic valve is tricuspid. Aortic valve regurgitation is trivial.  ?No aortic stenosis is present.  ? 6. The inferior vena cava is normal in size with <50% respiratory  ?variability, suggesting right atrial pressure of 8 mmHg.  ? ? ?ASSESSMENT AND PLAN: ? ?1.  Chronic systolic heart failure due to nonischemic cardiomyopathy: Currently on optimal medical therapy.  Is status post Medtronic CRT-D implanted 04/05/2020.  Device functioning appropriately.  No changes. ? ?2.  CKD stage IV: Status post nephrectomy October 2020.  Has follow-up with nephrology. ? ?3.  Paroxysmal atrial fibrillation: Currently on carvedilol and Eliquis.  CHA2DS2-VASc of 4.  Has rare episodes.  Continue with current management. ? ?4.  Hypertension: well controlled ? ?Current medicines are reviewed at length with the patient today.   ?The patient does not have concerns regarding  her medicines.  The following changes were made today: none ? ?Labs/ tests ordered today include:  ?Orders Placed This Encounter  ?Procedures  ? EKG 12-Lead  ? ? ? ?Disposition:   FU with Tzippy Testerman 12 months ? ?Signed, ?Marykate Heuberger Meredith Leeds, MD  ?01/09/2022 2:27 PM    ? ?[facility] ?[address] ?[address] ?[place] [address] ?[phone] (office) ?[phone] (fax) ?

## 2022-01-11 NOTE — Progress Notes (Signed)
Remote ICD transmission.   

## 2022-01-30 ENCOUNTER — Ambulatory Visit (INDEPENDENT_AMBULATORY_CARE_PROVIDER_SITE_OTHER): Payer: Medicare HMO

## 2022-01-30 DIAGNOSIS — I5022 Chronic systolic (congestive) heart failure: Secondary | ICD-10-CM

## 2022-01-30 DIAGNOSIS — Z95810 Presence of automatic (implantable) cardiac defibrillator: Secondary | ICD-10-CM | POA: Diagnosis not present

## 2022-01-30 NOTE — Progress Notes (Signed)
EPIC Encounter for ICM Monitoring ? ?Patient Name: Jeanne Bruce is a 66 y.o. female ?Date: 01/30/2022 ?Primary Care Physican: Derrill Center., MD ?Primary Cardiologist: Aundra Dubin ?Electrophysiologist: [place] ?Bi-V Pacing: 97.4%  Effective 96.7%   (% of Time Since 09-Jan-2022) ?01/09/2022 Office Weight: 160 lbs  ?01/31/2022 Weight: 150-155 lbs ?                                                         ?  ?Spoke with patient and heart failure questions reviewed.  Pt asymptomatic for fluid accumulation.  Reports feeling well at this time and voices no complaints.  ? ?Diet:  She eats restaurants foods about 2 times a week.  She does not add salt to food at home but does not review food labels for salt content of grocery store foods.  She drinks no more than 64 oz daily.  ?  ?Optivol thoracic impedance suggesting possible fluid accumulation starting 3/10.  Fluid index greater than normal threshold starting 3/22. ?  ?Prescribed: No diuretic prescribed ?  ?Labs: ?01/05/2022 Creatinine 2.76, BUN 45, Potassium 3.7, Sodium 139, GFR 18 Care Everywhere ?12/01/2021 Creatinine 2.55, BUN 43, Potassium 4.1, Sodium 142, GFR 20 Care Everywhere  ?A complete set of results can be found in Results Review. ?  ?Recommendations:  Advised to review foods for salt content and limit to 2000 mg daily.  Copy sent to Dr Aundra Dubin for review and recommendations if needed.  ?  ?Follow-up plan: ICM clinic phone appointment on 02/06/2022 to recheck fluid levels.   91 day device clinic remote transmission 04/03/2022.   ?  ?EP/Cardiology Office Visits:  01/09/2022 with Dr. Curt Bears.  Advised overdue to schedule HF clinic appointment.   01/27/2021 was last visit with Dr Aundra Dubin and recommended 3 month f/u for July 2022.   ?  ?Copy of ICM check sent to Dr. Curt Bears. ? ?3 month ICM trend: 01/30/2022. ? ? ? ?12-14 Month ICM trend:  ? ? ? ?Rosalene Billings, RN ?01/30/2022 ?10:58 AM ? ?

## 2022-01-31 NOTE — Progress Notes (Signed)
Start Lasix 20 mg daily with BMET 1 week.  Needs followup in CHF APP clinic to assess.  ?

## 2022-02-01 MED ORDER — FUROSEMIDE 20 MG PO TABS: 20.0000 mg | ORAL_TABLET | Freq: Every day | ORAL | 0 refills | Status: DC

## 2022-02-01 NOTE — Progress Notes (Addendum)
Spoke with patient and advised Dr Aundra Dubin recommended to start Furosemide 20 mg 1 tablet every morning to help with fluid management.  She uses [facility] for prescriptions and escript sent.  Advised to call back if she has any problems taking the medication.  BMET scheduled for 4/27 at HF clinic.  She agreed with plan and verbalized understanding.  Advised HF clinic will call to schedule an appointment.   ?

## 2022-02-01 NOTE — Addendum Note (Signed)
Addended by: Rosalene Billings on: 02/01/2022 09:55 AM ? ? Modules accepted: Orders ? ?

## 2022-02-06 ENCOUNTER — Ambulatory Visit (INDEPENDENT_AMBULATORY_CARE_PROVIDER_SITE_OTHER): Payer: Medicare HMO

## 2022-02-06 DIAGNOSIS — Z95810 Presence of automatic (implantable) cardiac defibrillator: Secondary | ICD-10-CM

## 2022-02-06 DIAGNOSIS — I5022 Chronic systolic (congestive) heart failure: Secondary | ICD-10-CM

## 2022-02-06 NOTE — Progress Notes (Signed)
EPIC Encounter for ICM Monitoring ? ?Patient Name: Jeanne Bruce is a 66 y.o. female ?Date: 02/06/2022 ?Primary Care Physican: Derrill Center., MD ?Primary Cardiologist: Aundra Dubin ?Electrophysiologist: Camnitz ?Bi-V Pacing: 98.3%        ?01/09/2022 Office Weight: 160 lbs  ?01/31/2022 Weight: 150-155 lbs ?                                                         ?  ?Spoke with patient and heart failure questions reviewed.  Pt asymptomatic for fluid accumulation.  Reports feeling well at this time and voices no complaints.  ?  ?Optivol thoracic impedance suggesting fluid levels returned to normal after starting Lasix prescription but fluid index remains greater than normal threshold starting 3/22. ?  ?Prescribed:  ?Furosemide 20 mg take 1 tablet daily ?  ?Labs:  ?02/09/2022 Labs scheduled ?01/05/2022 Creatinine 2.76, BUN 45, Potassium 3.7, Sodium 139, GFR 18 Care Everywhere ?12/01/2021 Creatinine 2.55, BUN 43, Potassium 4.1, Sodium 142, GFR 20 Care Everywhere  ?A complete set of results can be found in Results Review. ?  ?Recommendations:  No changes and encouraged to call if experiencing any fluid symptoms. ?  ?Follow-up plan: ICM clinic phone appointment on 03/06/2022.   91 day device clinic remote transmission 04/03/2022.   ?  ?EP/Cardiology Office Visits:  03/02/2022 with HF clinic NP/PA.  Recall 07/08/2022 with Dr. Curt Bears.     ?  ?Copy of ICM check sent to Dr. Curt Bears. ? ?3 month ICM trend: 02/06/2022. ? ? ? ?12-14 Month ICM trend:  ? ? ? ?Rosalene Billings, RN ?02/06/2022 ?10:04 AM ? ?

## 2022-02-09 ENCOUNTER — Ambulatory Visit (HOSPITAL_COMMUNITY)
Admission: RE | Admit: 2022-02-09 | Discharge: 2022-02-09 | Disposition: A | Discharge status: Home / Self Care | Payer: Medicare HMO | Source: Ambulatory Visit | Attending: Cardiology | Admitting: Cardiology

## 2022-02-09 DIAGNOSIS — I5022 Chronic systolic (congestive) heart failure: Secondary | ICD-10-CM | POA: Insufficient documentation

## 2022-02-09 LAB — BASIC METABOLIC PANEL
Anion gap: 11 (ref 5–15)
BUN: 43 mg/dL — ABNORMAL HIGH (ref 8–23)
CO2: 18 mmol/L — ABNORMAL LOW (ref 22–32)
Calcium: 8.7 mg/dL — ABNORMAL LOW (ref 8.9–10.3)
Chloride: 110 mmol/L (ref 98–111)
Creatinine, Ser: 3.23 mg/dL — ABNORMAL HIGH (ref 0.44–1.00)
GFR, Estimated: 15 mL/min — ABNORMAL LOW (ref >60)
Glucose, Bld: 138 mg/dL — ABNORMAL HIGH (ref 70–99)
Potassium: 2.9 mmol/L — ABNORMAL LOW (ref 3.5–5.1)
Sodium: 139 mmol/L (ref 135–145)

## 2022-02-10 ENCOUNTER — Encounter (HOSPITAL_BASED_OUTPATIENT_CLINIC_OR_DEPARTMENT_OTHER): Payer: Self-pay

## 2022-02-10 ENCOUNTER — Emergency Department (HOSPITAL_BASED_OUTPATIENT_CLINIC_OR_DEPARTMENT_OTHER): Payer: Medicare HMO

## 2022-02-10 ENCOUNTER — Emergency Department (HOSPITAL_BASED_OUTPATIENT_CLINIC_OR_DEPARTMENT_OTHER)
Admission: EM | Admit: 2022-02-10 | Discharge: 2022-02-10 | Disposition: A | Discharge status: Home / Self Care | Payer: Medicare HMO | Attending: Emergency Medicine | Admitting: Emergency Medicine

## 2022-02-10 ENCOUNTER — Other Ambulatory Visit: Payer: Self-pay

## 2022-02-10 DIAGNOSIS — Z7901 Long term (current) use of anticoagulants: Secondary | ICD-10-CM | POA: Diagnosis not present

## 2022-02-10 DIAGNOSIS — I110 Hypertensive heart disease with heart failure: Secondary | ICD-10-CM | POA: Diagnosis not present

## 2022-02-10 DIAGNOSIS — I5020 Unspecified systolic (congestive) heart failure: Secondary | ICD-10-CM | POA: Diagnosis not present

## 2022-02-10 DIAGNOSIS — R058 Other specified cough: Secondary | ICD-10-CM

## 2022-02-10 DIAGNOSIS — Z20822 Contact with and (suspected) exposure to covid-19: Secondary | ICD-10-CM | POA: Insufficient documentation

## 2022-02-10 DIAGNOSIS — Z79899 Other long term (current) drug therapy: Secondary | ICD-10-CM | POA: Diagnosis not present

## 2022-02-10 DIAGNOSIS — E876 Hypokalemia: Secondary | ICD-10-CM | POA: Diagnosis not present

## 2022-02-10 DIAGNOSIS — Z85038 Personal history of other malignant neoplasm of large intestine: Secondary | ICD-10-CM | POA: Insufficient documentation

## 2022-02-10 DIAGNOSIS — R059 Cough, unspecified: Secondary | ICD-10-CM | POA: Diagnosis present

## 2022-02-10 DIAGNOSIS — D72829 Elevated white blood cell count, unspecified: Secondary | ICD-10-CM | POA: Diagnosis not present

## 2022-02-10 LAB — CBC WITH DIFFERENTIAL/PLATELET
Abs Immature Granulocytes: 0.09 10*3/uL — ABNORMAL HIGH (ref 0.00–0.07)
Basophils Absolute: 0 10*3/uL (ref 0.0–0.1)
Basophils Relative: 0 %
Eosinophils Absolute: 0.3 10*3/uL (ref 0.0–0.5)
Eosinophils Relative: 2 %
HCT: 33.8 % — ABNORMAL LOW (ref 36.0–46.0)
Hemoglobin: 11.2 g/dL — ABNORMAL LOW (ref 12.0–15.0)
Immature Granulocytes: 1 %
Lymphocytes Relative: 13 %
Lymphs Abs: 1.9 10*3/uL (ref 0.7–4.0)
MCH: 30.8 pg (ref 26.0–34.0)
MCHC: 33.1 g/dL (ref 30.0–36.0)
MCV: 92.9 fL (ref 80.0–100.0)
Monocytes Absolute: 0.5 10*3/uL (ref 0.1–1.0)
Monocytes Relative: 3 %
Neutro Abs: 12.2 10*3/uL — ABNORMAL HIGH (ref 1.7–7.7)
Neutrophils Relative %: 81 %
Platelets: 211 10*3/uL (ref 150–400)
RBC: 3.64 MIL/uL — ABNORMAL LOW (ref 3.87–5.11)
RDW: 14.5 % (ref 11.5–15.5)
WBC: 15 10*3/uL — ABNORMAL HIGH (ref 4.0–10.5)
nRBC: 0 % (ref 0.0–0.2)

## 2022-02-10 LAB — CUP PACEART INCLINIC DEVICE CHECK
Battery Remaining Longevity: 96 mo
Battery Voltage: 3.02 V
Brady Statistic AP VP Percent: 0.18 %
Brady Statistic AP VS Percent: 0 %
Brady Statistic AS VP Percent: 98.24 %
Brady Statistic AS VS Percent: 1.57 %
Brady Statistic RA Percent Paced: 0.18 %
Brady Statistic RV Percent Paced: 2.37 %
Date Time Interrogation Session: 20230327142927
HighPow Impedance: 65 Ohm
Implantable Lead Implant Date: 20210621
Implantable Lead Implant Date: 20210621
Implantable Lead Implant Date: 20210621
Implantable Lead Location: 753858
Implantable Lead Location: 753859
Implantable Lead Location: 753860
Implantable Lead Model: 4398
Implantable Lead Model: 5076
Implantable Pulse Generator Implant Date: 20210621
Lead Channel Impedance Value: 156.606
Lead Channel Impedance Value: 156.606
Lead Channel Impedance Value: 161.5 Ohm
Lead Channel Impedance Value: 161.5 Ohm
Lead Channel Impedance Value: 161.5 Ohm
Lead Channel Impedance Value: 304 Ohm
Lead Channel Impedance Value: 304 Ohm
Lead Channel Impedance Value: 323 Ohm
Lead Channel Impedance Value: 323 Ohm
Lead Channel Impedance Value: 323 Ohm
Lead Channel Impedance Value: 342 Ohm
Lead Channel Impedance Value: 380 Ohm
Lead Channel Impedance Value: 380 Ohm
Lead Channel Impedance Value: 532 Ohm
Lead Channel Impedance Value: 532 Ohm
Lead Channel Impedance Value: 532 Ohm
Lead Channel Impedance Value: 570 Ohm
Lead Channel Impedance Value: 570 Ohm
Lead Channel Pacing Threshold Amplitude: 0.5 V
Lead Channel Pacing Threshold Amplitude: 0.75 V
Lead Channel Pacing Threshold Amplitude: 1.5 V
Lead Channel Pacing Threshold Pulse Width: 0.4 ms
Lead Channel Pacing Threshold Pulse Width: 0.4 ms
Lead Channel Pacing Threshold Pulse Width: 0.4 ms
Lead Channel Sensing Intrinsic Amplitude: 14.875 mV
Lead Channel Sensing Intrinsic Amplitude: 2.375 mV
Lead Channel Sensing Intrinsic Amplitude: 2.375 mV
Lead Channel Sensing Intrinsic Amplitude: 7.625 mV
Lead Channel Setting Pacing Amplitude: 1.5 V
Lead Channel Setting Pacing Amplitude: 2 V
Lead Channel Setting Pacing Amplitude: 2.5 V
Lead Channel Setting Pacing Pulse Width: 0.4 ms
Lead Channel Setting Pacing Pulse Width: 0.4 ms
Lead Channel Setting Sensing Sensitivity: 0.3 mV

## 2022-02-10 LAB — BASIC METABOLIC PANEL
Anion gap: 10 (ref 5–15)
BUN: 42 mg/dL — ABNORMAL HIGH (ref 8–23)
CO2: 17 mmol/L — ABNORMAL LOW (ref 22–32)
Calcium: 8.6 mg/dL — ABNORMAL LOW (ref 8.9–10.3)
Chloride: 111 mmol/L (ref 98–111)
Creatinine, Ser: 3.01 mg/dL — ABNORMAL HIGH (ref 0.44–1.00)
GFR, Estimated: 16 mL/min — ABNORMAL LOW (ref >60)
Glucose, Bld: 147 mg/dL — ABNORMAL HIGH (ref 70–99)
Potassium: 3 mmol/L — ABNORMAL LOW (ref 3.5–5.1)
Sodium: 138 mmol/L (ref 135–145)

## 2022-02-10 LAB — TROPONIN I (HIGH SENSITIVITY)
Troponin I (High Sensitivity): 8 ng/L (ref <18)
Troponin I (High Sensitivity): 7 ng/L (ref <18)

## 2022-02-10 LAB — RESP PANEL BY RT-PCR (FLU A&B, COVID) ARPGX2
Influenza A by PCR: NEGATIVE
Influenza B by PCR: NEGATIVE
SARS Coronavirus 2 by RT PCR: NEGATIVE

## 2022-02-10 LAB — BRAIN NATRIURETIC PEPTIDE: B Natriuretic Peptide: 113.6 pg/mL — ABNORMAL HIGH (ref 0.0–100.0)

## 2022-02-10 MED ORDER — IPRATROPIUM-ALBUTEROL 0.5-2.5 (3) MG/3ML IN SOLN
3.0000 mL | Freq: Once | RESPIRATORY_TRACT | Status: AC
  Administered 2022-02-10: 3 mL via RESPIRATORY_TRACT
  Filled 2022-02-10: qty 3

## 2022-02-10 MED ORDER — POTASSIUM CHLORIDE CRYS ER 20 MEQ PO TBCR: 20.0000 meq | EXTENDED_RELEASE_TABLET | Freq: Two times a day (BID) | ORAL | 0 refills | Status: DC

## 2022-02-10 MED ORDER — DOXYCYCLINE HYCLATE 100 MG PO CAPS: 100.0000 mg | ORAL_CAPSULE | Freq: Two times a day (BID) | ORAL | 0 refills | Status: AC

## 2022-02-10 MED ORDER — POTASSIUM CHLORIDE 20 MEQ PO PACK
40.0000 meq | PACK | Freq: Two times a day (BID) | ORAL | Status: DC
  Administered 2022-02-10: 40 meq via ORAL
  Filled 2022-02-10: qty 2

## 2022-02-10 MED ORDER — POTASSIUM CHLORIDE CRYS ER 20 MEQ PO TBCR
40.0000 meq | EXTENDED_RELEASE_TABLET | Freq: Once | ORAL | Status: DC
  Filled 2022-02-10: qty 2

## 2022-02-10 MED ORDER — GUAIFENESIN-CODEINE 100-10 MG/5ML PO SOLN
5.0000 mL | Freq: Once | ORAL | Status: AC
  Administered 2022-02-10: 5 mL via ORAL
  Filled 2022-02-10: qty 5

## 2022-02-10 MED ORDER — PREDNISONE 20 MG PO TABS: 40.0000 mg | ORAL_TABLET | Freq: Every day | ORAL | 0 refills | Status: AC

## 2022-02-10 MED ORDER — MAGNESIUM SULFATE 2 GM/50ML IV SOLN
2.0000 g | Freq: Once | INTRAVENOUS | Status: AC
  Administered 2022-02-10: 2 g via INTRAVENOUS
  Filled 2022-02-10: qty 50

## 2022-02-10 MED ORDER — POTASSIUM CHLORIDE 10 MEQ/100ML IV SOLN
10.0000 meq | Freq: Once | INTRAVENOUS | Status: AC
  Administered 2022-02-10: 10 meq via INTRAVENOUS
  Filled 2022-02-10: qty 100

## 2022-02-10 MED ORDER — GUAIFENESIN-CODEINE 100-10 MG/5ML PO SOLN: 5.0000 mL | Freq: Three times a day (TID) | ORAL | 0 refills | Status: DC | PRN

## 2022-02-10 MED ORDER — SODIUM CHLORIDE 0.9 % IV BOLUS
500.0000 mL | Freq: Once | INTRAVENOUS | Status: AC
  Administered 2022-02-10: 500 mL via INTRAVENOUS

## 2022-02-10 NOTE — Discharge Instructions (Addendum)
It was a pleasure caring for you today in the emergency department. ° °Please return to the emergency department for any worsening or worrisome symptoms. ° ° °

## 2022-02-10 NOTE — ED Provider Notes (Signed)
?[place] EMERGENCY DEPARTMENT ?Provider Note ? ? ?CSN: [id number] ?Arrival date & time: 02/10/22  1149 ? ?  ? ?History ? ?Chief Complaint  ?Patient presents with  ? Cough  ? Weakness  ? ? ?Jeanne Bruce is a 66 y.o. female. ? ? Patient as above with significant medical history as below, including systolic heart failure, colon cancer status post colostomy, MS, hypertension who presents to the ED with complaint of cough.  Patient ports has been having an intermittent cough over the past 4 months.  She has been treated with multiple antibiotics, inhalers, steroids.  Cough improved significantly but returned about a week ago.  She is having productive cough with yellow/white phlegm.  No longer smoking, no home oxygen use.  No CPAP use.  No exertional dyspnea.  No chest pain.  Sometimes has posttussive emesis but otherwise no abdominal pain, nausea or vomiting.  She is having some ongoing fatigue, no focal weakness. ? ?started on lasix 4/17, follows w/ Dr Camnitz/Dr Aundra Dubin ? ? ? ?Past Medical History: ?No date: Cancer [facility]) ?    Comment:  colon (resolved) ?No date: CHF (congestive heart failure) ([place]) ?No date: Hypertension ?No date: Multiple sclerosis ([place]) ?No date: Renal disorder ? ?Past Surgical History: ?04/05/2020: BIV ICD INSERTION CRT-D; N/A ?    Comment:  Procedure: BIV ICD INSERTION CRT-D;  Surgeon: Curt Bears,  ?             Ocie Doyne, MD;  Location: [place] CV LAB;  Service: ?             Cardiovascular;  Laterality: N/A; ?08/14/2019: LEFT HEART CATH AND CORONARY ANGIOGRAPHY; N/A ?    Comment:  Procedure: LEFT HEART CATH AND CORONARY ANGIOGRAPHY;   ?             Surgeon: Burnell Blanks, MD;  Location: [facility]  ?             INVASIVE CV LAB;  Service: Cardiovascular;  Laterality:  ?             N/A; ?No date: NEPHRECTOMY TRANSPLANTED ORGAN  ? ? ?The history is provided by the patient and the spouse. No language interpreter was used.  ?Cough ?Associated symptoms: no chest pain, no chills, no  fever, no headaches, no rash and no shortness of breath   ?Weakness ?Associated symptoms: cough   ?Associated symptoms: no abdominal pain, no chest pain, no fever, no headaches, no nausea, no shortness of breath and no vomiting   ? ?  ? ?Home Medications ?Prior to Admission medications   ?Medication Sig Start Date End Date Taking? Authorizing Provider  ?carvedilol (COREG) 6.25 MG tablet Take 1 tablet (6.25 mg total) by mouth 2 (two) times daily with a meal. NEEDS APPOINTMENT FOR ANYMORE REFILLS 11/23/21  Yes Larey Dresser, MD  ?cholecalciferol (VITAMIN D3) 25 MCG (1000 UT) tablet Take 1,000 Units by mouth in the morning and at bedtime.    Yes [provider]  ?Cyanocobalamin (VITAMIN B12 PO) Take 1 tablet by mouth daily.    Yes [provider]  ?doxycycline (VIBRAMYCIN) 100 MG capsule Take 1 capsule (100 mg total) by mouth 2 (two) times daily for 5 days. 02/10/22 02/15/22 Yes Wynona Dove A, DO  ?ELIQUIS 5 MG TABS tablet TAKE 1 TABLET(5 MG) BY MOUTH TWICE DAILY 05/04/21  Yes Larey Dresser, MD  ?empagliflozin (JARDIANCE) 10 MG TABS tablet Take 1 tablet (10 mg total) by mouth daily. NEEDS OFFICE VISIT FOR  MORE REFILLS 10/24/21  Yes Larey Dresser, MD  ?furosemide (LASIX) 20 MG tablet Take 1 tablet (20 mg total) by mouth daily. 02/01/22 05/02/22 Yes Larey Dresser, MD  ?guaiFENesin-codeine 100-10 MG/5ML syrup Take 5 mLs by mouth 3 (three) times daily as needed for cough. 02/10/22  Yes Wynona Dove A, DO  ?magnesium oxide (MAG-OX) 400 MG tablet Take 400 mg by mouth daily.   Yes [provider]  ?potassium chloride SA (KLOR-CON M) 20 MEQ tablet Take 1 tablet (20 mEq total) by mouth 2 (two) times daily for 3 days. 02/10/22 02/13/22 Yes Jeanell Sparrow, DO  ?predniSONE (DELTASONE) 20 MG tablet Take 2 tablets (40 mg total) by mouth daily for 5 days. 02/10/22 02/15/22 Yes Jeanell Sparrow, DO  ?Probiotic Product (PROBIOTIC DAILY PO) Take 1 capsule by mouth daily.   Yes [provider]  ?    ? ?Allergies    ?Ativan [lorazepam], Invanz [ertapenem], Mirabegron, Claritin-d 12 hour [loratadine-pseudoephedrine er], and Penicillins   ? ?Review of Systems   ?Review of Systems  ?Constitutional:  Positive for fatigue. Negative for chills and fever.  ?HENT:  Negative for facial swelling and trouble swallowing.   ?Eyes:  Negative for photophobia and visual disturbance.  ?Respiratory:  Positive for cough. Negative for shortness of breath.   ?Cardiovascular:  Negative for chest pain and palpitations.  ?Gastrointestinal:  Negative for abdominal pain, nausea and vomiting.  ?Endocrine: Negative for polydipsia and polyuria.  ?Genitourinary:  Negative for difficulty urinating and hematuria.  ?Musculoskeletal:  Negative for gait problem and joint swelling.  ?Skin:  Negative for pallor and rash.  ?Neurological:  Negative for syncope and headaches.  ?Psychiatric/Behavioral:  Negative for agitation and confusion.   ? ?Physical Exam ?Updated Vital Signs ?BP (!) 115/51   Pulse 78   Temp 98.3 ?F (36.8 ?C) (Oral)   Resp 14   Ht '5\' 1"'$  (1.549 m)   Wt 72.6 kg   SpO2 98%   BMI 30.23 kg/m?  ?Physical Exam ?Vitals and nursing note reviewed.  ?Constitutional:   ?   General: She is not in acute distress. ?   Appearance: Normal appearance. She is not ill-appearing or diaphoretic.  ?HENT:  ?   Head: Normocephalic and atraumatic.  ?   Right Ear: External ear normal.  ?   Left Ear: External ear normal.  ?   Nose: Nose normal.  ?   Mouth/Throat:  ?   Mouth: Mucous membranes are moist.  ?Eyes:  ?   General: No scleral icterus.    ?   Right eye: No discharge.     ?   Left eye: No discharge.  ?Cardiovascular:  ?   Rate and Rhythm: Normal rate and regular rhythm.  ?   Pulses: Normal pulses.  ?   Heart sounds: Normal heart sounds.  ?Pulmonary:  ?   Effort: Pulmonary effort is normal. No tachypnea, accessory muscle usage or respiratory distress.  ?   Breath sounds: Normal breath sounds. No stridor. No decreased breath sounds, wheezing or  rhonchi.  ?Abdominal:  ?   General: Abdomen is flat.  ?   Tenderness: There is no abdominal tenderness.  ?Musculoskeletal:     ?   General: Normal range of motion.  ?   Cervical back: Normal range of motion.  ?   Right lower leg: No edema.  ?   Left lower leg: No edema.  ?Skin: ?   General: Skin is warm and dry.  ?  Capillary Refill: Capillary refill takes less than 2 seconds.  ?Neurological:  ?   Mental Status: She is alert and oriented to person, place, and time.  ?   GCS: GCS eye subscore is 4. GCS verbal subscore is 5. GCS motor subscore is 6.  ?Psychiatric:     ?   Mood and Affect: Mood normal.     ?   Behavior: Behavior normal.  ? ? ?ED Results / Procedures / Treatments   ?Labs ?(all labs ordered are listed, but only abnormal results are displayed) ?Labs Reviewed  ?BASIC METABOLIC PANEL - Abnormal; Notable for the following components:  ?    Result Value  ? Potassium 3.0 (*)   ? CO2 17 (*)   ? Glucose, Bld 147 (*)   ? BUN 42 (*)   ? Creatinine, Ser 3.01 (*)   ? Calcium 8.6 (*)   ? GFR, Estimated 16 (*)   ? All other components within normal limits  ?CBC WITH DIFFERENTIAL/PLATELET - Abnormal; Notable for the following components:  ? WBC 15.0 (*)   ? RBC 3.64 (*)   ? Hemoglobin 11.2 (*)   ? HCT 33.8 (*)   ? Neutro Abs 12.2 (*)   ? Abs Immature Granulocytes 0.09 (*)   ? All other components within normal limits  ?BRAIN NATRIURETIC PEPTIDE - Abnormal; Notable for the following components:  ? B Natriuretic Peptide 113.6 (*)   ? All other components within normal limits  ?RESP PANEL BY RT-PCR (FLU A&B, COVID) [id number]  ?TROPONIN I (HIGH SENSITIVITY)  ?TROPONIN I (HIGH SENSITIVITY)  ? ? ?EKG ?EKG Interpretation ? ?Date/Time:  Friday February 10 2022 12:13:21 EDT ?Ventricular Rate:  82 ?PR Interval:  181 ?QRS Duration: 143 ?QT Interval:  485 ?QTC Calculation: 567 ?R Axis:   94 ?Text Interpretation: Sinus rhythm Nonspecific intraventricular conduction delay Probable lateral infarct, age indeterminate Probable anteroseptal  infarct, old paced similar to prior no stemi Confirmed by Wynona Dove ([id number]) on 02/10/2022 12:49:39 PM ? ?Radiology ?DG Chest 2 View ? ?Result Date: 02/10/2022 ?CLINICAL DATA:  Cough. EXAM: CHEST - 2 VIEW COMPARISON:  Chest r

## 2022-02-10 NOTE — ED Notes (Signed)
Able to obtain bloodwork from [facility]. Was unable to thread IV catheter into vein. Will attempt again if IV is needed ?

## 2022-02-10 NOTE — ED Triage Notes (Signed)
Patient has had chronic cough x December. Productive yellow cough reported. Speaking in full sentences. Patient denies any new symptoms since she was assessed by her doctor.  ? ?

## 2022-03-01 NOTE — Progress Notes (Signed)
Heart Failure Clinic Note   Date:  03/02/2022  PCP:  Derrill Center., MD  Cardiologist:  Fransico Him, MD Primary HF: Dr. Aundra Dubin  History of Present Illness: Jeanne Bruce is a 66 y.o. female with history of CKD stage 4, chronic systolic CHF, multiple sclerosis, and right nephrectomy.  She was referred by Dr. Radford Pax for CHF evaluation.  Patient also had remote colon cancer with colostomy.  She has had frequent UTIs and was admitted in 10/20 with urosepsis.  She had respiratory arrest and VF arrest in the ER and was intubated.  Echo was done, showing EF 25% with apical ballooning.  LHC showed no significant coronary disease.  She ended up having right nephrectomy for renal abscess in 10/20.    Echo was repeated in 12/20, showing EF still low at 25-30% with peri-apical akinesis.   She has had a hard time taking cardiac medications due to orthostatic symptoms with low BP.  She was sent home from the hospital in 10/20 on hydralazine/Imdur but had to stop due to low BP.    Echo in 4/21 showed EF 25% with septal-lateral dyssynchrony, normal RV.  Medtronic CRT-D device was placed in 6/21.  Repeat echo in 1/22 showed EF up to 50-55% with normal RV.   Seen for virtual visit 04/22 d/t COVID-19 pandemic. Had been doing well. No med changes. Noted drop in BiV pacing percentage with episodes of atrial fibrillation. F/u recommended in 3 months.  Furosemide 20 mg daily started on 01/30/22 d/t elevated OptiVol fluid index.   Here today for f/u to access volume status. Reports her nephrologist recently referred her for transplant evaluation, has appointment with transplant team at [facility] next month. Reports more shortness of breath with exertion over the last few months. Not able to walk more than a couple of blocks without stopping to rest. No recent change in weight with diuretic, weighs 160 lb consistently at home. No CP or palpitations. No orthopnea, PND or LE edema. No dizziness, presyncope or  syncope. No recent UTI.  Eats out twice a week. Does not add salt to food, doing better watching food labels.  Device interrogation: OptiVol fluid index trended below threshold after diuretic started in April. 2 episodes of AF less than 1 minute in April. 100% BiV paced  Labs (11/20): creatinine 1.5 Labs (12/20): creatinine 2.47 Labs (1/21): creatinine 2.1 => 2.04, BNP 54 Labs (6/21): creatinine 2.33 Labs (7/21): K 5.7, creatinine 2.61 Labs (9/21): K 4.5, creatinine 2.38 Labs (4/22): K 5.5, creatinine 2.5, LDL 91  PMH: 1. H/o right nephrectomy for renal abscess in 10/20.  2. Multiple Sclerosis 3. HTN 4. H/o colon cancer: s/p colectomy with colostomy.  5. CKD: Stage 4. Sees a nephrologist at [facility].  6. Frequent UTIs.  7. Chronic systolic CHF: Nonischemic cardiomyopathy. Medtronic CRT-D device.  - Echo (10/20): EF 25%, apical ballooning.  - LHC (10/20): Normal coronaries.  - Echo (12/20): EF 25-30%, peri-apical akinesis, normal RV size and systolic function (similar to 10/20 echo).   - Echo (4/21): EF 25%, septal-lateral dyssynchrony - Echo (1/22): EF 50-55%, normal RV.  8. LBBB  Social History   Socioeconomic History   Marital status: Married    Spouse name: Not on file   Number of children: Not on file   Years of education: Not on file   Highest education level: Not on file  Occupational History   Not on file  Tobacco Use   Smoking status: Never  Smokeless tobacco: Never  Substance and Sexual Activity   Alcohol use: Not Currently   Drug use: Never   Sexual activity: Not on file  Other Topics Concern   Not on file  Social History Narrative   Not on file   Social Determinants of Health   Financial Resource Strain: Not on file  Food Insecurity: Not on file  Transportation Needs: Not on file  Physical Activity: Not on file  Stress: Not on file  Social Connections: Not on file  Intimate Partner Violence: Not on file   Family History  Problem Relation Age of  Onset   Atrial fibrillation Father    Diabetes Mellitus II Neg Hx    ROS: All systems reviewed and negative except as per HPI.   Current Outpatient Medications  Medication Sig Dispense Refill   carvedilol (COREG) 6.25 MG tablet Take 1 tablet (6.25 mg total) by mouth 2 (two) times daily with a meal. NEEDS APPOINTMENT FOR ANYMORE REFILLS 60 tablet 0   cholecalciferol (VITAMIN D3) 25 MCG (1000 UT) tablet Take 1,000 Units by mouth in the morning and at bedtime.      Cyanocobalamin (VITAMIN B12 PO) Take 1 tablet by mouth daily.      ELIQUIS 5 MG TABS tablet TAKE 1 TABLET(5 MG) BY MOUTH TWICE DAILY 60 tablet 11   furosemide (LASIX) 20 MG tablet Take 1 tablet (20 mg total) by mouth daily. 90 tablet 0   guaiFENesin-codeine 100-10 MG/5ML syrup Take 5 mLs by mouth 3 (three) times daily as needed for cough. 118 mL 0   magnesium oxide (MAG-OX) 400 MG tablet Take 400 mg by mouth daily.     Probiotic Product (PROBIOTIC DAILY PO) Take 1 capsule by mouth daily.     No current facility-administered medications for this encounter.   Exam:   BP 118/76   Pulse 71   Wt 72.8 kg (160 lb 9.6 oz)   SpO2 100%   BMI 30.35 kg/m  General:  Well appearing. Ambulated into clinic HEENT: normal Neck: supple. no JVD. Carotids 2+ bilat; no bruits. No lymphadenopathy or thryomegaly appreciated. Cor: PMI nondisplaced. Regular rate & rhythm. No rubs, gallops or murmurs. Lungs: clear Abdomen: soft, nontender, nondistended. + colostomy Extremities: no cyanosis, clubbing, rash, edema Neuro: alert & orientedx3, cranial nerves grossly intact. moves all 4 extremities w/o difficulty. Affect pleasant   Assessment/Plan:  1. Chronic systolic CHF: Nonischemic cardiomyopathy, LHC in 10/20 with no significant coronary disease.  Echo in 10/20 was suggestive of stress (Takotsubo-type) cardiomyopathy with apical ballooning (from severe urosepsis).  However, LV function did not improve on repeat echo in 12/20 or echo in 4/21 (EF  still 25%).  ?LBBB cardiomyopathy.  She is now s/p Medtronic CRT-D device. Repeat echo in 1/22 showed EF up to 50-55%, good response to CRT.  - NYHA early 3. More dyspnea with exertion over last few months. Volume looks good on exam. OptiVol fluid index below threshold on device interrogation. Continue furosemide 20 mg daily. - Off spironolactone due to hyperkalemia.   - Continue Coreg 6.25 mg bid.  - No ACEI/ARNI/ARB for now with CKD stage 4.  - Stop Jardiance with GFR < 20. - Medical therapy limited by orthostasis and CKD IV. - Repeat echo d/t recent volume overload and increased dyspnea with exertion. - F/u 4 weeks to reassess volume status after stopping Jardiance. May need more diuretic. 2. CKD stage 4: S/p right nephrectomy in 10/20.  Sees nephrology at [facility]. Creatinine 2.8 most recently.  -  Stopped Jardiance as above - Her Nephrologist has referred her to [facility] for transplant evaluation.  3. Atrial fibrillation: Paroxysmal.  Noted after device implantation.  Very brief, infrequent episodes on today's check. - Continue apixaban 5 mg bid.  - Follow device for atrial fibrillation burden.  May need to consider ablation or antiarrhythmic in the future, EP is following.  On device interrogation, BiV pacing percentage significantly dropped when she went into atrial fibrillation.     Follow-up: 4 weeks with Dr. Aundra Dubin with echo  [facility], Lynder Parents, PA-C  03/02/2022  [address] [place] [address] [phone] (office) [phone] (fax)

## 2022-03-02 ENCOUNTER — Ambulatory Visit (HOSPITAL_COMMUNITY)
Admission: RE | Admit: 2022-03-02 | Discharge: 2022-03-02 | Disposition: A | Discharge status: Home / Self Care | Payer: Medicare HMO | Source: Ambulatory Visit | Attending: Physician Assistant | Admitting: Physician Assistant

## 2022-03-02 ENCOUNTER — Encounter (HOSPITAL_COMMUNITY): Payer: Self-pay

## 2022-03-02 VITALS — BP 118/76 | HR 71 | Wt 160.6 lb

## 2022-03-02 DIAGNOSIS — I5022 Chronic systolic (congestive) heart failure: Secondary | ICD-10-CM | POA: Diagnosis not present

## 2022-03-02 DIAGNOSIS — Z79899 Other long term (current) drug therapy: Secondary | ICD-10-CM | POA: Diagnosis not present

## 2022-03-02 DIAGNOSIS — Z95810 Presence of automatic (implantable) cardiac defibrillator: Secondary | ICD-10-CM | POA: Diagnosis not present

## 2022-03-02 DIAGNOSIS — I428 Other cardiomyopathies: Secondary | ICD-10-CM | POA: Diagnosis not present

## 2022-03-02 DIAGNOSIS — I130 Hypertensive heart and chronic kidney disease with heart failure and stage 1 through stage 4 chronic kidney disease, or unspecified chronic kidney disease: Secondary | ICD-10-CM | POA: Insufficient documentation

## 2022-03-02 DIAGNOSIS — Z905 Acquired absence of kidney: Secondary | ICD-10-CM | POA: Insufficient documentation

## 2022-03-02 DIAGNOSIS — G35 Multiple sclerosis: Secondary | ICD-10-CM | POA: Insufficient documentation

## 2022-03-02 DIAGNOSIS — E875 Hyperkalemia: Secondary | ICD-10-CM | POA: Diagnosis not present

## 2022-03-02 DIAGNOSIS — I480 Paroxysmal atrial fibrillation: Secondary | ICD-10-CM | POA: Insufficient documentation

## 2022-03-02 DIAGNOSIS — Z85038 Personal history of other malignant neoplasm of large intestine: Secondary | ICD-10-CM | POA: Diagnosis not present

## 2022-03-02 DIAGNOSIS — Z7901 Long term (current) use of anticoagulants: Secondary | ICD-10-CM | POA: Diagnosis not present

## 2022-03-02 DIAGNOSIS — N184 Chronic kidney disease, stage 4 (severe): Secondary | ICD-10-CM | POA: Diagnosis not present

## 2022-03-02 NOTE — Patient Instructions (Signed)
No Labs done today.   STOP taking Jardiance  No other medication changes were made. Please continue all current medications as prescribed.  Your physician recommends that you schedule a follow-up appointment in: 3-4 months with Dr. Aundra Dubin with an echo prior to your exam.  Your physician has requested that you have an echocardiogram. Echocardiography is a painless test that uses sound waves to create images of your heart. It provides your doctor with information about the size and shape of your heart and how well your heart's chambers and valves are working. This procedure takes approximately one hour. There are no restrictions for this procedure.  If you have any questions or concerns before your next appointment please send [place] a message through [place] or call our office at [phone].    TO LEAVE A MESSAGE FOR THE NURSE SELECT OPTION 2, PLEASE LEAVE A MESSAGE INCLUDING: YOUR NAME DATE OF BIRTH CALL BACK NUMBER REASON FOR CALL**this is important as we prioritize the call backs  YOU WILL RECEIVE A CALL BACK THE SAME DAY AS LONG AS YOU CALL BEFORE 4:00 PM   Do the following things EVERYDAY: Weigh yourself in the morning before breakfast. Write it down and keep it in a log. Take your medicines as prescribed Eat low salt foods--Limit salt (sodium) to 2000 mg per day.  Stay as active as you can everyday Limit all fluids for the day to less than 2 liters   At the [facility], you and your health needs are our priority. As part of our continuing mission to provide you with exceptional heart care, we have created designated Provider Care Teams. These Care Teams include your primary Cardiologist (physician) and Advanced Practice Providers (APPs- Physician Assistants and Nurse Practitioners) who all work together to provide you with the care you need, when you need it.   You may see any of the following providers on your designated Care Team at your next follow up: Dr  Glori Bickers Dr Haynes Kerns, NP Lyda Jester, [place] Audry Riles, PharmD   Please be sure to bring in all your medications bottles to every appointment.

## 2022-03-06 ENCOUNTER — Ambulatory Visit (INDEPENDENT_AMBULATORY_CARE_PROVIDER_SITE_OTHER): Payer: Medicare HMO

## 2022-03-06 DIAGNOSIS — Z95810 Presence of automatic (implantable) cardiac defibrillator: Secondary | ICD-10-CM | POA: Diagnosis not present

## 2022-03-06 DIAGNOSIS — I5022 Chronic systolic (congestive) heart failure: Secondary | ICD-10-CM | POA: Diagnosis not present

## 2022-03-06 NOTE — Progress Notes (Unsigned)
EPIC Encounter for ICM Monitoring  Patient Name: Jeanne Bruce is a 66 y.o. female Date: 03/06/2022 Primary Care Physican: Derrill Center., MD Primary Cardiologist: Aundra Dubin Electrophysiologist: Curt Bears Bi-V Pacing: 98.3%        01/09/2022 Office Weight: 160 lbs  01/31/2022 Weight: 150-155 lbs 03/02/2022 Office Weight: 160 lbs                                                            Spoke with patient and heart failure questions reviewed.  Pt asymptomatic for fluid accumulation.  Reports feeling well at this time and voices no complaints.  5/18 HF Clinic OV note reports not volume overloaded by exam even though Optivol trending below baseline.   Optivol thoracic impedance suggesting possible fluid accumulation starting 5/14.   Prescribed:  Furosemide 20 mg take 1 tablet by mouth daily (prescribed 02/01/2022)   Labs:  02/15/2022 Creatinine 2.84, BUN 63, Potassium 4.1, Sodium 138, GFR 18 02/10/2022 Creatinine 3.01, BUN 42, Potassium 3.0, Sodium 138, GFR 16 02/09/2022 Creatinine 3.23, BUN 43, Potassium 2.9, Sodium 139, GFR 15 01/05/2022 Creatinine 2.76, BUN 45, Potassium 3.7, Sodium 139, GFR 18 Care Everywhere 12/01/2021 Creatinine 2.55, BUN 43, Potassium 4.1, Sodium 142, GFR 20 Care Everywhere  A complete set of results can be found in Results Review.   Recommendations:  Encouraged to limit salt intake and she limits fluid intake to 64 oz daily.  Copy sent to Marlyce Huge, [place] HF clinic since she saw patient in office 5/18   Follow-up plan: ICM clinic phone appointment on 03/14/2022 to recheck fluid levels   91 day device clinic remote transmission 04/03/2022.     EP/Cardiology Office Visits:  06/09/2022 with Dr Aundra Dubin.  Recall 07/08/2022 with Dr. Curt Bears.       Copy of ICM check sent to Dr. Curt Bears.   3 month ICM trend: 03/06/2022.    12-14 Month ICM trend:     Rosalene Billings, RN 03/06/2022 11:16 AM

## 2022-03-08 NOTE — Progress Notes (Signed)
Joette Catching, PA-C  Kruze Atchley Panda, RN OptiVol below threshold for fluid but looks like thoracic impedance remains low suggesting possible fluid accumulation. Takes 20 mg furosemide daily, would suggest taking an extra 20 mg furosemide for 2 days. Recently stopped SGLT2i which may increase risk of fluid accumulation.

## 2022-03-08 NOTE — Progress Notes (Signed)
Attempted call to patient and unable to reach.  Left message to return call.  

## 2022-03-08 NOTE — Progress Notes (Signed)
Spoke with patient and advised Marlyce Huge, [place] HF clinic recommended she take an extra 20 mg furosemide for 2 days.  After 2nd day return to 1 tablet daily.  She repeated instructions back correctly.

## 2022-03-14 ENCOUNTER — Ambulatory Visit (INDEPENDENT_AMBULATORY_CARE_PROVIDER_SITE_OTHER): Payer: Medicare HMO

## 2022-03-14 DIAGNOSIS — I5022 Chronic systolic (congestive) heart failure: Secondary | ICD-10-CM

## 2022-03-14 DIAGNOSIS — Z95810 Presence of automatic (implantable) cardiac defibrillator: Secondary | ICD-10-CM

## 2022-03-15 ENCOUNTER — Telehealth: Payer: Self-pay

## 2022-03-15 NOTE — Telephone Encounter (Signed)
Remote ICM transmission received.  Attempted call to patient regarding ICM remote transmission and left message per DPR to return call.   

## 2022-03-15 NOTE — Progress Notes (Signed)
EPIC Encounter for ICM Monitoring  Patient Name: Jeanne Bruce is a 66 y.o. female Date: 03/15/2022 Primary Care Physican: Derrill Center., MD Primary Cardiologist: Aundra Dubin Electrophysiologist: Curt Bears Bi-V Pacing: 98.2%        01/09/2022 Office Weight: 160 lbs  01/31/2022 Weight: 150-155 lbs 03/02/2022 Office Weight: 160 lbs                                                            Attempted call to patient and unable to reach.  Left message to return call. Transmission reviewed.    Optivol thoracic impedance suggesting fluid levels returned to normal after taking extra Furosemide x 2 days.     Prescribed:  Furosemide 20 mg take 1 tablet by mouth daily (prescribed 02/01/2022)   Labs:  02/15/2022 Creatinine 2.84, BUN 63, Potassium 4.1, Sodium 138, GFR 18 02/10/2022 Creatinine 3.01, BUN 42, Potassium 3.0, Sodium 138, GFR 16 02/09/2022 Creatinine 3.23, BUN 43, Potassium 2.9, Sodium 139, GFR 15 01/05/2022 Creatinine 2.76, BUN 45, Potassium 3.7, Sodium 139, GFR 18 Care Everywhere 12/01/2021 Creatinine 2.55, BUN 43, Potassium 4.1, Sodium 142, GFR 20 Care Everywhere  A complete set of results can be found in Results Review.   Recommendations:  Unable to reach.     Follow-up plan: ICM clinic phone appointment on 04/10/2022.   91 day device clinic remote transmission 04/03/2022.     EP/Cardiology Office Visits:  06/09/2022 with Dr Aundra Dubin.  Recall 07/08/2022 with Dr. Curt Bears.       Copy of ICM check sent to Dr. Curt Bears.  3 month ICM trend: 03/15/2022.    12-14 Month ICM trend:     Rosalene Billings, RN 03/15/2022 9:00 AM

## 2022-03-27 ENCOUNTER — Other Ambulatory Visit (HOSPITAL_COMMUNITY): Payer: Self-pay

## 2022-03-27 MED ORDER — CARVEDILOL 6.25 MG PO TABS: 6.2500 mg | ORAL_TABLET | Freq: Two times a day (BID) | ORAL | 2 refills | Status: DC

## 2022-04-03 ENCOUNTER — Ambulatory Visit (INDEPENDENT_AMBULATORY_CARE_PROVIDER_SITE_OTHER): Payer: Medicare HMO

## 2022-04-03 DIAGNOSIS — I420 Dilated cardiomyopathy: Secondary | ICD-10-CM | POA: Diagnosis not present

## 2022-04-04 LAB — CUP PACEART REMOTE DEVICE CHECK
Battery Remaining Longevity: 93 mo
Battery Voltage: 3.01 V
Brady Statistic AP VP Percent: 0.02 %
Brady Statistic AP VS Percent: 0 %
Brady Statistic AS VP Percent: 98.57 %
Brady Statistic AS VS Percent: 1.41 %
Brady Statistic RA Percent Paced: 0.03 %
Brady Statistic RV Percent Paced: 1.86 %
Date Time Interrogation Session: 20230619022604
HighPow Impedance: 68 Ohm
Implantable Lead Implant Date: 20210621
Implantable Lead Implant Date: 20210621
Implantable Lead Implant Date: 20210621
Implantable Lead Location: 753858
Implantable Lead Location: 753859
Implantable Lead Location: 753860
Implantable Lead Model: 4398
Implantable Lead Model: 5076
Implantable Pulse Generator Implant Date: 20210621
Lead Channel Impedance Value: 156.606
Lead Channel Impedance Value: 156.606
Lead Channel Impedance Value: 156.606
Lead Channel Impedance Value: 161.5 Ohm
Lead Channel Impedance Value: 161.5 Ohm
Lead Channel Impedance Value: 304 Ohm
Lead Channel Impedance Value: 304 Ohm
Lead Channel Impedance Value: 323 Ohm
Lead Channel Impedance Value: 323 Ohm
Lead Channel Impedance Value: 323 Ohm
Lead Channel Impedance Value: 323 Ohm
Lead Channel Impedance Value: 323 Ohm
Lead Channel Impedance Value: 342 Ohm
Lead Channel Impedance Value: 494 Ohm
Lead Channel Impedance Value: 513 Ohm
Lead Channel Impedance Value: 532 Ohm
Lead Channel Impedance Value: 532 Ohm
Lead Channel Impedance Value: 532 Ohm
Lead Channel Pacing Threshold Amplitude: 0.375 V
Lead Channel Pacing Threshold Amplitude: 0.875 V
Lead Channel Pacing Threshold Pulse Width: 0.4 ms
Lead Channel Pacing Threshold Pulse Width: 0.4 ms
Lead Channel Sensing Intrinsic Amplitude: 11.25 mV
Lead Channel Sensing Intrinsic Amplitude: 11.25 mV
Lead Channel Sensing Intrinsic Amplitude: 2.25 mV
Lead Channel Sensing Intrinsic Amplitude: 2.25 mV
Lead Channel Setting Pacing Amplitude: 1.5 V
Lead Channel Setting Pacing Amplitude: 2 V
Lead Channel Setting Pacing Amplitude: 2.5 V
Lead Channel Setting Pacing Pulse Width: 0.4 ms
Lead Channel Setting Pacing Pulse Width: 0.4 ms
Lead Channel Setting Sensing Sensitivity: 0.3 mV

## 2022-04-10 ENCOUNTER — Ambulatory Visit (INDEPENDENT_AMBULATORY_CARE_PROVIDER_SITE_OTHER): Payer: Medicare HMO

## 2022-04-10 DIAGNOSIS — Z95810 Presence of automatic (implantable) cardiac defibrillator: Secondary | ICD-10-CM

## 2022-04-10 DIAGNOSIS — I5022 Chronic systolic (congestive) heart failure: Secondary | ICD-10-CM

## 2022-04-11 ENCOUNTER — Telehealth (HOSPITAL_COMMUNITY): Payer: Self-pay | Admitting: *Deleted

## 2022-04-11 ENCOUNTER — Institutional Professional Consult (permissible substitution): Payer: Medicare HMO | Admitting: Pulmonary Disease

## 2022-04-24 NOTE — Progress Notes (Signed)
Remote ICD transmission.   

## 2022-04-29 ENCOUNTER — Other Ambulatory Visit: Payer: Self-pay | Admitting: Cardiology

## 2022-05-11 ENCOUNTER — Other Ambulatory Visit (HOSPITAL_COMMUNITY): Payer: Self-pay | Admitting: Cardiology

## 2022-05-15 ENCOUNTER — Ambulatory Visit (INDEPENDENT_AMBULATORY_CARE_PROVIDER_SITE_OTHER): Payer: Medicare HMO

## 2022-05-15 DIAGNOSIS — Z95810 Presence of automatic (implantable) cardiac defibrillator: Secondary | ICD-10-CM

## 2022-05-15 DIAGNOSIS — I5022 Chronic systolic (congestive) heart failure: Secondary | ICD-10-CM

## 2022-05-18 ENCOUNTER — Telehealth: Payer: Self-pay

## 2022-05-18 NOTE — Telephone Encounter (Signed)
Remote ICM transmission received.  Attempted call to patient regarding ICM remote transmission and no answer.  

## 2022-05-18 NOTE — Progress Notes (Signed)
EPIC Encounter for ICM Monitoring  Patient Name: Jeanne Bruce is a 66 y.o. female Date: 05/18/2022 Primary Care Physican: Derrill Center., MD Primary Cardiologist: Aundra Dubin Electrophysiologist: Curt Bears Bi-V Pacing: 98.3%        01/09/2022 Office Weight: 160 lbs  01/31/2022 Weight: 150-155 lbs 04/11/2022 Weight: 155-160 lbs                                                            Attempted call to patient and unable to reach.  Transmission reviewed.     Optivol thoracic impedance normal but was suggesting possible fluid accumulation from 6/12-7/14.   Prescribed:  Furosemide 20 mg take 1 tablet by mouth daily (prescribed 02/01/2022)   Labs:  02/15/2022 Creatinine 2.84, BUN 63, Potassium 4.1, Sodium 138, GFR 18 02/10/2022 Creatinine 3.01, BUN 42, Potassium 3.0, Sodium 138, GFR 16 02/09/2022 Creatinine 3.23, BUN 43, Potassium 2.9, Sodium 139, GFR 15 01/05/2022 Creatinine 2.76, BUN 45, Potassium 3.7, Sodium 139, GFR 18 Care Everywhere 12/01/2021 Creatinine 2.55, BUN 43, Potassium 4.1, Sodium 142, GFR 20 Care Everywhere  A complete set of results can be found in Results Review.   Recommendations:  Unable to reach.     Follow-up plan: ICM clinic phone appointment on 06/20/2022.   91 day device clinic remote transmission 07/03/2022.     EP/Cardiology Office Visits:  06/09/2022 with Dr Aundra Dubin.  Recall 07/08/2022 with Dr. Curt Bears.       Copy of ICM check sent to Dr. Curt Bears.  3 month ICM trend: 05/15/2022.    12-14 Month ICM trend:     Rosalene Billings, RN 05/18/2022 12:07 PM

## 2022-06-09 ENCOUNTER — Ambulatory Visit (HOSPITAL_COMMUNITY)
Admission: RE | Admit: 2022-06-09 | Discharge: 2022-06-09 | Disposition: A | Discharge status: Home / Self Care | Payer: Medicare HMO | Source: Ambulatory Visit | Attending: Physician Assistant | Admitting: Physician Assistant

## 2022-06-09 ENCOUNTER — Encounter (HOSPITAL_COMMUNITY): Payer: Self-pay | Admitting: Cardiology

## 2022-06-09 ENCOUNTER — Ambulatory Visit (HOSPITAL_BASED_OUTPATIENT_CLINIC_OR_DEPARTMENT_OTHER)
Admission: RE | Admit: 2022-06-09 | Discharge: 2022-06-09 | Disposition: A | Discharge status: Home / Self Care | Payer: Medicare HMO | Source: Ambulatory Visit | Attending: Cardiology | Admitting: Cardiology

## 2022-06-09 VITALS — BP 118/70 | HR 69 | Wt 164.8 lb

## 2022-06-09 DIAGNOSIS — Z79899 Other long term (current) drug therapy: Secondary | ICD-10-CM | POA: Diagnosis not present

## 2022-06-09 DIAGNOSIS — E875 Hyperkalemia: Secondary | ICD-10-CM | POA: Insufficient documentation

## 2022-06-09 DIAGNOSIS — Z7901 Long term (current) use of anticoagulants: Secondary | ICD-10-CM | POA: Insufficient documentation

## 2022-06-09 DIAGNOSIS — Z87440 Personal history of urinary (tract) infections: Secondary | ICD-10-CM | POA: Insufficient documentation

## 2022-06-09 DIAGNOSIS — Z933 Colostomy status: Secondary | ICD-10-CM | POA: Diagnosis not present

## 2022-06-09 DIAGNOSIS — I130 Hypertensive heart and chronic kidney disease with heart failure and stage 1 through stage 4 chronic kidney disease, or unspecified chronic kidney disease: Secondary | ICD-10-CM | POA: Insufficient documentation

## 2022-06-09 DIAGNOSIS — R092 Respiratory arrest: Secondary | ICD-10-CM | POA: Diagnosis not present

## 2022-06-09 DIAGNOSIS — Z85038 Personal history of other malignant neoplasm of large intestine: Secondary | ICD-10-CM | POA: Diagnosis not present

## 2022-06-09 DIAGNOSIS — G35 Multiple sclerosis: Secondary | ICD-10-CM | POA: Insufficient documentation

## 2022-06-09 DIAGNOSIS — I5022 Chronic systolic (congestive) heart failure: Secondary | ICD-10-CM | POA: Diagnosis present

## 2022-06-09 DIAGNOSIS — I480 Paroxysmal atrial fibrillation: Secondary | ICD-10-CM | POA: Diagnosis not present

## 2022-06-09 DIAGNOSIS — Z01818 Encounter for other preprocedural examination: Secondary | ICD-10-CM | POA: Diagnosis not present

## 2022-06-09 DIAGNOSIS — N184 Chronic kidney disease, stage 4 (severe): Secondary | ICD-10-CM | POA: Insufficient documentation

## 2022-06-09 DIAGNOSIS — Z905 Acquired absence of kidney: Secondary | ICD-10-CM | POA: Diagnosis not present

## 2022-06-09 DIAGNOSIS — I959 Hypotension, unspecified: Secondary | ICD-10-CM | POA: Diagnosis not present

## 2022-06-09 DIAGNOSIS — I428 Other cardiomyopathies: Secondary | ICD-10-CM | POA: Diagnosis not present

## 2022-06-09 LAB — ECHOCARDIOGRAM COMPLETE
Area-P 1/2: 2.42 cm2
S' Lateral: 3 cm

## 2022-06-09 NOTE — Patient Instructions (Signed)
EKG done today.  No Labs done today.   No medication changes were made. Please continue all current medications as prescribed.  Your physician recommends that you schedule a follow-up appointment in: 4 months with our NP/PA Clinic here in our office.  If you have any questions or concerns before your next appointment please send [place] a message through [place] or call our office at [phone].    TO LEAVE A MESSAGE FOR THE NURSE SELECT OPTION 2, PLEASE LEAVE A MESSAGE INCLUDING: YOUR NAME DATE OF BIRTH CALL BACK NUMBER REASON FOR CALL**this is important as we prioritize the call backs  YOU WILL RECEIVE A CALL BACK THE SAME DAY AS LONG AS YOU CALL BEFORE 4:00 PM   Do the following things EVERYDAY: Weigh yourself in the morning before breakfast. Write it down and keep it in a log. Take your medicines as prescribed Eat low salt foods--Limit salt (sodium) to 2000 mg per day.  Stay as active as you can everyday Limit all fluids for the day to less than 2 liters   At the [facility], you and your health needs are our priority. As part of our continuing mission to provide you with exceptional heart care, we have created designated Provider Care Teams. These Care Teams include your primary Cardiologist (physician) and Advanced Practice Providers (APPs- Physician Assistants and Nurse Practitioners) who all work together to provide you with the care you need, when you need it.   You may see any of the following providers on your designated Care Team at your next follow up: Dr Glori Bickers Dr Haynes Kerns, NP Lyda Jester, [place] Audry Riles, PharmD   Please be sure to bring in all your medications bottles to every appointment.

## 2022-06-09 NOTE — Progress Notes (Signed)
  Echocardiogram 2D Echocardiogram has been performed.  Jeanne Bruce 06/09/2022, 11:43 AM

## 2022-06-11 NOTE — Progress Notes (Signed)
Heart Failure Clinic Note   Date:  06/11/2022  PCP:  Derrill Center., MD  Cardiologist:  Fransico Him, MD Primary HF: Dr. Aundra Dubin  History of Present Illness: Jeanne Bruce is a 66 y.o. female with history of CKD stage 4, chronic systolic CHF, multiple sclerosis, and right nephrectomy.  She was referred by Dr. Radford Pax for CHF evaluation.  Patient also had remote colon cancer with colostomy.  She has had frequent UTIs and was admitted in 10/20 with urosepsis.  She had respiratory arrest and VF arrest in the ER and was intubated.  Echo was done, showing EF 25% with apical ballooning.  LHC showed no significant coronary disease.  She ended up having right nephrectomy for renal abscess in 10/20.    Echo was repeated in 12/20, showing EF still low at 25-30% with peri-apical akinesis.   She has had a hard time taking cardiac medications due to orthostatic symptoms with low BP.  She was sent home from the hospital in 10/20 on hydralazine/Imdur but had to stop due to low BP.    Echo in 4/21 showed EF 25% with septal-lateral dyssynchrony, normal RV.  Medtronic CRT-D device was placed in 6/21.  Repeat echo in 1/22 showed EF up to 50-55% with normal RV.   Seen for virtual visit 04/22 d/t COVID-19 pandemic. Had been doing well. No med changes. Noted drop in BiV pacing percentage with episodes of atrial fibrillation. F/u recommended in 3 months.  Furosemide 20 mg daily started on 01/30/22 due to elevated OptiVol fluid index.   Echo was done today, EF 50-55%, normal RV, normal IVC.   Patient returns for followup of CHF. She has been doing well recently.  No dyspnea walking on flat ground, mild dyspnea walking up stairs.  No chest pain.  No lightheadedness.  She has had brief PAF on device interrogation but nothing prolonged.  No orthopnea/PND.    MDT device interrogation: >99% BiV paced, stable thoracic impedance, no recent AF/VT.   Labs (11/20): creatinine 1.5 Labs (12/20): creatinine 2.47 Labs  (1/21): creatinine 2.1 => 2.04, BNP 54 Labs (6/21): creatinine 2.33 Labs (7/21): K 5.7, creatinine 2.61 Labs (9/21): K 4.5, creatinine 2.38 Labs (4/22): K 5.5, creatinine 2.5, LDL 91 Labs (8/23): K 3.5, creatinine 2.5, hgb 11.6, LDL 97  PMH: 1. H/o right nephrectomy for renal abscess in 10/20.  2. Multiple Sclerosis 3. HTN 4. H/o colon cancer: s/p colectomy with colostomy.  5. CKD: Stage 4. Sees a nephrologist at [facility].  6. Frequent UTIs.  7. Chronic systolic CHF: Nonischemic cardiomyopathy. Medtronic CRT-D device.  - Echo (10/20): EF 25%, apical ballooning.  - LHC (10/20): Normal coronaries.  - Echo (12/20): EF 25-30%, peri-apical akinesis, normal RV size and systolic function (similar to 10/20 echo).   - Echo (4/21): EF 25%, septal-lateral dyssynchrony - Echo (1/22): EF 50-55%, normal RV.  - Echo (8/23): EF 50-55%, normal RV, normal IVC.  8. LBBB 9. Atrial fibrillation: Paroxysmal.   Social History   Socioeconomic History   Marital status: Married    Spouse name: Not on file   Number of children: Not on file   Years of education: Not on file   Highest education level: Not on file  Occupational History   Not on file  Tobacco Use   Smoking status: Never   Smokeless tobacco: Never  Substance and Sexual Activity   Alcohol use: Not Currently   Drug use: Never   Sexual activity: Not on file  Other Topics  Concern   Not on file  Social History Narrative   Not on file   Social Determinants of Health   Financial Resource Strain: Not on file  Food Insecurity: Not on file  Transportation Needs: No Transportation Needs (11/25/2019)   PRAPARE - Hydrologist (Medical): No    Lack of Transportation (Non-Medical): No  Physical Activity: Insufficiently Active (11/25/2019)   Exercise Vital Sign    Days of Exercise per Week: 2 days    Minutes of Exercise per Session: 10 min  Stress: Not on file  Social Connections: Unknown (11/25/2019)   Social  Connection and Isolation Panel [NHANES]    Frequency of Communication with Friends and Family: More than three times a week    Frequency of Social Gatherings with Friends and Family: Twice a week    Attends Religious Services: More than 4 times per year    Active Member of [employer] or Organizations: Not on file    Attends Archivist Meetings: Not on file    Marital Status: Married  Human resources officer Violence: Not on file   Family History  Problem Relation Age of Onset   Atrial fibrillation Father    Diabetes Mellitus II Neg Hx    ROS: All systems reviewed and negative except as per HPI.   Current Outpatient Medications  Medication Sig Dispense Refill   carvedilol (COREG) 6.25 MG tablet Take 1 tablet (6.25 mg total) by mouth 2 (two) times daily with a meal. NEEDS APPOINTMENT FOR ANYMORE REFILLS Must keep appointments 60 tablet 2   cholecalciferol (VITAMIN D3) 25 MCG (1000 UT) tablet Take 1,000 Units by mouth in the morning and at bedtime.      Cyanocobalamin (VITAMIN B12 PO) Take 1 tablet by mouth daily.      ELIQUIS 5 MG TABS tablet TAKE 1 TABLET(5 MG) BY MOUTH TWICE DAILY 60 tablet 11   furosemide (LASIX) 20 MG tablet TAKE 1 TABLET(20 MG) BY MOUTH DAILY 90 tablet 3   magnesium oxide (MAG-OX) 400 MG tablet Take 400 mg by mouth daily.     Probiotic Product (PROBIOTIC DAILY PO) Take 1 capsule by mouth daily.     No current facility-administered medications for this encounter.   Exam:   BP 118/70   Pulse 69   Wt 74.8 kg (164 lb 12.8 oz)   SpO2 97%   BMI 31.14 kg/m  General: NAD Neck: No JVD, no thyromegaly or thyroid nodule.  Lungs: Clear to auscultation bilaterally with normal respiratory effort. CV: Nondisplaced PMI.  Heart regular S1/S2, no S3/S4, no murmur.  No peripheral edema.  No carotid bruit.  Normal pedal pulses.  Abdomen: Soft, nontender, no hepatosplenomegaly, no distention.  Skin: Intact without lesions or rashes.  Neurologic: Alert and oriented x 3.  Psych:  Normal affect. Extremities: No clubbing or cyanosis.  HEENT: Normal.   Assessment/Plan:  1. Chronic systolic CHF: Nonischemic cardiomyopathy, LHC in 10/20 with no significant coronary disease.  Echo in 10/20 was suggestive of stress (Takotsubo-type) cardiomyopathy with apical ballooning (from severe urosepsis).  However, LV function did not improve on repeat echo in 12/20 or echo in 4/21 (EF still 25%).  ?LBBB cardiomyopathy.  She is now s/p Medtronic CRT-D device. Repeat echo in 1/22 showed EF up to 50-55%, good response to CRT.  Echo today also showed EF 50-55%.  NYHA class II symptoms, not volume overloaded by exam or Optivol.  Medical therapy limited by orthostasis and CKD IV. - Continue Lasix  20 mg daily.  BMET today.  - Off spironolactone due to hyperkalemia.   - Continue Coreg 6.25 mg bid.  - No ACEI/ARNI/ARB for now with CKD stage 4.  - Off Jardiance with GFR < 20. 2. CKD stage 4: S/p right nephrectomy in 10/20.  Sees nephrology at [facility]. Creatinine 2.5 most recently.  - BMET today.  - Her Nephrologist has referred her to [facility] for transplant evaluation.  3. Atrial fibrillation: Paroxysmal.  Noted after device implantation.  No recent episodes.   - Continue apixaban 5 mg bid.  - Rare AF, if becomes more frequent and impedes BiV pacing, may need ablation.   Followup 4 months with APP.   Loralie Champagne, MD  06/11/2022  [place] [address] Heart and [place] [address] [phone] (office) [phone] (fax)

## 2022-06-14 ENCOUNTER — Encounter (HOSPITAL_COMMUNITY): Payer: Medicare HMO | Admitting: Cardiology

## 2022-06-14 ENCOUNTER — Other Ambulatory Visit (HOSPITAL_COMMUNITY): Payer: Medicare HMO

## 2022-06-20 ENCOUNTER — Ambulatory Visit (INDEPENDENT_AMBULATORY_CARE_PROVIDER_SITE_OTHER): Payer: Medicare HMO

## 2022-06-20 DIAGNOSIS — I5022 Chronic systolic (congestive) heart failure: Secondary | ICD-10-CM

## 2022-06-20 DIAGNOSIS — Z95810 Presence of automatic (implantable) cardiac defibrillator: Secondary | ICD-10-CM | POA: Diagnosis not present

## 2022-06-23 ENCOUNTER — Telehealth: Payer: Self-pay

## 2022-06-23 NOTE — Progress Notes (Signed)
EPIC Encounter for ICM Monitoring  Patient Name: Jeanne Bruce is a 66 y.o. female Date: 06/23/2022 Primary Care Physican: Derrill Center., MD Primary Cardiologist: Aundra Dubin Electrophysiologist: Curt Bears Bi-V Pacing: 98.5%        01/09/2022 Office Weight: 160 lbs  01/31/2022 Weight: 150-155 lbs 04/11/2022 Weight: 155-160 lbs                                                            Attempted call to patient and unable to reach.   Transmission reviewed.    Optivol thoracic impedance suggesting normal fluid levels.   Prescribed:  Furosemide 20 mg take 1 tablet by mouth daily    Labs:  06/01/2022 Creatinine 2.50, BUN 37, Potassium 3.5, Sodium 140, GFR 20 02/15/2022 Creatinine 2.84, BUN 63, Potassium 4.1, Sodium 138, GFR 18 02/10/2022 Creatinine 3.01, BUN 42, Potassium 3.0, Sodium 138, GFR 16 02/09/2022 Creatinine 3.23, BUN 43, Potassium 2.9, Sodium 139, GFR 15 01/05/2022 Creatinine 2.76, BUN 45, Potassium 3.7, Sodium 139, GFR 18 Care Everywhere 12/01/2021 Creatinine 2.55, BUN 43, Potassium 4.1, Sodium 142, GFR 20 Care Everywhere  A complete set of results can be found in Results Review.   Recommendations:  Unable to reach.     Follow-up plan: ICM clinic phone appointment on 07/31/2022.   91 day device clinic remote transmission 07/03/2022.     EP/Cardiology Office Visits:  10/12/2022 with Dr Aundra Dubin.  Recall 07/08/2022 with Dr. Curt Bears.       Copy of ICM check sent to Dr. Curt Bears.  3 month ICM trend: 06/20/2022.    12-14 Month ICM trend:     Rosalene Billings, RN 06/23/2022 9:51 AM

## 2022-06-23 NOTE — Telephone Encounter (Signed)
Remote ICM transmission received.  Attempted call to patient regarding ICM remote transmission and no answer or voice mail option.  

## 2022-07-01 ENCOUNTER — Other Ambulatory Visit (HOSPITAL_COMMUNITY): Payer: Self-pay | Admitting: Cardiology

## 2022-07-03 ENCOUNTER — Ambulatory Visit (INDEPENDENT_AMBULATORY_CARE_PROVIDER_SITE_OTHER): Payer: Medicare HMO

## 2022-07-03 DIAGNOSIS — I5022 Chronic systolic (congestive) heart failure: Secondary | ICD-10-CM | POA: Diagnosis not present

## 2022-07-04 LAB — CUP PACEART REMOTE DEVICE CHECK
Battery Remaining Longevity: 90 mo
Battery Voltage: 3.01 V
Brady Statistic AP VP Percent: 0.03 %
Brady Statistic AP VS Percent: 0 %
Brady Statistic AS VP Percent: 98.59 %
Brady Statistic AS VS Percent: 1.38 %
Brady Statistic RA Percent Paced: 0.03 %
Brady Statistic RV Percent Paced: 2.28 %
Date Time Interrogation Session: 20230918001703
HighPow Impedance: 68 Ohm
Implantable Lead Implant Date: 20210621
Implantable Lead Implant Date: 20210621
Implantable Lead Implant Date: 20210621
Implantable Lead Location: 753858
Implantable Lead Location: 753859
Implantable Lead Location: 753860
Implantable Lead Model: 4398
Implantable Lead Model: 5076
Implantable Pulse Generator Implant Date: 20210621
Lead Channel Impedance Value: 156.606
Lead Channel Impedance Value: 156.606
Lead Channel Impedance Value: 156.606
Lead Channel Impedance Value: 161.5 Ohm
Lead Channel Impedance Value: 161.5 Ohm
Lead Channel Impedance Value: 304 Ohm
Lead Channel Impedance Value: 304 Ohm
Lead Channel Impedance Value: 323 Ohm
Lead Channel Impedance Value: 323 Ohm
Lead Channel Impedance Value: 323 Ohm
Lead Channel Impedance Value: 342 Ohm
Lead Channel Impedance Value: 342 Ohm
Lead Channel Impedance Value: 380 Ohm
Lead Channel Impedance Value: 532 Ohm
Lead Channel Impedance Value: 532 Ohm
Lead Channel Impedance Value: 532 Ohm
Lead Channel Impedance Value: 532 Ohm
Lead Channel Impedance Value: 570 Ohm
Lead Channel Pacing Threshold Amplitude: 0.375 V
Lead Channel Pacing Threshold Amplitude: 0.75 V
Lead Channel Pacing Threshold Pulse Width: 0.4 ms
Lead Channel Pacing Threshold Pulse Width: 0.4 ms
Lead Channel Sensing Intrinsic Amplitude: 11.25 mV
Lead Channel Sensing Intrinsic Amplitude: 11.25 mV
Lead Channel Sensing Intrinsic Amplitude: 2.375 mV
Lead Channel Sensing Intrinsic Amplitude: 2.375 mV
Lead Channel Setting Pacing Amplitude: 1.5 V
Lead Channel Setting Pacing Amplitude: 2 V
Lead Channel Setting Pacing Amplitude: 2.5 V
Lead Channel Setting Pacing Pulse Width: 0.4 ms
Lead Channel Setting Pacing Pulse Width: 0.4 ms
Lead Channel Setting Sensing Sensitivity: 0.3 mV

## 2022-07-15 NOTE — Progress Notes (Signed)
Remote ICD transmission.   

## 2022-07-31 ENCOUNTER — Ambulatory Visit (INDEPENDENT_AMBULATORY_CARE_PROVIDER_SITE_OTHER): Payer: Medicare HMO

## 2022-07-31 DIAGNOSIS — I5022 Chronic systolic (congestive) heart failure: Secondary | ICD-10-CM | POA: Diagnosis not present

## 2022-07-31 DIAGNOSIS — Z95810 Presence of automatic (implantable) cardiac defibrillator: Secondary | ICD-10-CM | POA: Diagnosis not present

## 2022-08-04 ENCOUNTER — Telehealth: Payer: Self-pay

## 2022-08-04 NOTE — Telephone Encounter (Signed)
Remote ICM transmission received.  Attempted call to patient regarding ICM remote transmission and no answer.  

## 2022-08-04 NOTE — Progress Notes (Signed)
EPIC Encounter for ICM Monitoring  Patient Name: Jeanne Bruce is a 66 y.o. female Date: 08/04/2022 Primary Care Physican: Derrill Center., MD Primary Cardiologist: Aundra Dubin Electrophysiologist: Curt Bears Bi-V Pacing: 98.4%        01/09/2022 Office Weight: 160 lbs  01/31/2022 Weight: 150-155 lbs 04/11/2022 Weight: 155-160 lbs                                                            Attempted call to patient and unable to reach.   Transmission reviewed.    Optivol thoracic impedance suggesting possible fluid accumulation starting 9/28 and returned to normal 10/16.   Prescribed:  Furosemide 20 mg take 1 tablet by mouth daily    Labs:  06/01/2022 Creatinine 2.50, BUN 37, Potassium 3.5, Sodium 140, GFR 20 02/15/2022 Creatinine 2.84, BUN 63, Potassium 4.1, Sodium 138, GFR 18 02/10/2022 Creatinine 3.01, BUN 42, Potassium 3.0, Sodium 138, GFR 16 02/09/2022 Creatinine 3.23, BUN 43, Potassium 2.9, Sodium 139, GFR 15 01/05/2022 Creatinine 2.76, BUN 45, Potassium 3.7, Sodium 139, GFR 18 Care Everywhere 12/01/2021 Creatinine 2.55, BUN 43, Potassium 4.1, Sodium 142, GFR 20 Care Everywhere  A complete set of results can be found in Results Review.   Recommendations:  Unable to reach.     Follow-up plan: ICM clinic phone appointment on 07/31/2022.   91 day device clinic remote transmission 10/02/2022.   EP/Cardiology Office Visits:  10/12/2022 with Dr Aundra Dubin.  Recall 07/08/2022 with Dr. Curt Bears.       Copy of ICM check sent to Dr. Curt Bears.  3 month ICM trend: 07/31/2022.    12-14 Month ICM trend:     Rosalene Billings, RN 08/04/2022 10:27 AM

## 2022-09-11 ENCOUNTER — Ambulatory Visit (INDEPENDENT_AMBULATORY_CARE_PROVIDER_SITE_OTHER): Payer: Medicare HMO

## 2022-09-11 DIAGNOSIS — I5022 Chronic systolic (congestive) heart failure: Secondary | ICD-10-CM

## 2022-09-11 DIAGNOSIS — Z95810 Presence of automatic (implantable) cardiac defibrillator: Secondary | ICD-10-CM | POA: Diagnosis not present

## 2022-09-15 ENCOUNTER — Telehealth: Payer: Self-pay

## 2022-09-15 NOTE — Progress Notes (Signed)
EPIC Encounter for ICM Monitoring  Patient Name: Jeanne Bruce is a 66 y.o. female Date: 09/15/2022 Primary Care Physican: Derrill Center., MD Primary Cardiologist: Aundra Dubin Electrophysiologist: Curt Bears Bi-V Pacing: 98.3%        01/09/2022 Office Weight: 160 lbs  01/31/2022 Weight: 150-155 lbs 04/11/2022 Weight: 155-160 lbs                                                            Attempted call to patient and unable to reach.  Left message to return call. Transmission reviewed.    Optivol thoracic impedance suggesting possible fluid accumulation starting 11/6 and returning to baseline.   Prescribed:  Furosemide 20 mg take 1 tablet by mouth daily    Labs:  06/01/2022 Creatinine 2.50, BUN 37, Potassium 3.5, Sodium 140, GFR 20 02/15/2022 Creatinine 2.84, BUN 63, Potassium 4.1, Sodium 138, GFR 18 02/10/2022 Creatinine 3.01, BUN 42, Potassium 3.0, Sodium 138, GFR 16 02/09/2022 Creatinine 3.23, BUN 43, Potassium 2.9, Sodium 139, GFR 15 01/05/2022 Creatinine 2.76, BUN 45, Potassium 3.7, Sodium 139, GFR 18 Care Everywhere 12/01/2021 Creatinine 2.55, BUN 43, Potassium 4.1, Sodium 142, GFR 20 Care Everywhere  A complete set of results can be found in Results Review.   Recommendations:  Unable to reach.     Follow-up plan: ICM clinic phone appointment on 10/23/2022.   91 day device clinic remote transmission 10/02/2022.   EP/Cardiology Office Visits:  10/12/2022 with Dr Aundra Dubin.  Recall 07/08/2022 with Dr. Curt Bears.       Copy of ICM check sent to Dr. Curt Bears.  3 month ICM trend: 09/11/2022.    12-14 Month ICM trend:     Rosalene Billings, RN 09/15/2022 10:49 AM

## 2022-09-15 NOTE — Progress Notes (Addendum)
Spoke with patient and heart failure questions reviewed.  Transmission results reviewed.  Pt reports she has a mild case COVID after visiting family for the holidays.  Unsure what may have caused decreased impedance earlier in November.  She does tno have any fluid symptoms.  No changes and encouraged to call if experiencing any fluid symptoms.

## 2022-09-15 NOTE — Telephone Encounter (Signed)
Remote ICM transmission received.  Attempted call to patient regarding ICM remote transmission and left message to return call   

## 2022-10-02 ENCOUNTER — Ambulatory Visit (INDEPENDENT_AMBULATORY_CARE_PROVIDER_SITE_OTHER): Payer: Medicare HMO

## 2022-10-02 DIAGNOSIS — I5022 Chronic systolic (congestive) heart failure: Secondary | ICD-10-CM

## 2022-10-02 DIAGNOSIS — I420 Dilated cardiomyopathy: Secondary | ICD-10-CM | POA: Diagnosis not present

## 2022-10-03 LAB — CUP PACEART REMOTE DEVICE CHECK
Battery Remaining Longevity: 86 mo
Battery Voltage: 3.01 V
Brady Statistic AP VP Percent: 0.02 %
Brady Statistic AP VS Percent: 0 %
Brady Statistic AS VP Percent: 97.2 %
Brady Statistic AS VS Percent: 2.77 %
Brady Statistic RA Percent Paced: 0.02 %
Brady Statistic RV Percent Paced: 3.35 %
Date Time Interrogation Session: 20231218043824
HighPow Impedance: 80 Ohm
Implantable Lead Connection Status: 753985
Implantable Lead Connection Status: 753985
Implantable Lead Connection Status: 753985
Implantable Lead Implant Date: 20210621
Implantable Lead Implant Date: 20210621
Implantable Lead Implant Date: 20210621
Implantable Lead Location: 753858
Implantable Lead Location: 753859
Implantable Lead Location: 753860
Implantable Lead Model: 4398
Implantable Lead Model: 5076
Implantable Pulse Generator Implant Date: 20210621
Lead Channel Impedance Value: 152 Ohm
Lead Channel Impedance Value: 152 Ohm
Lead Channel Impedance Value: 156.606
Lead Channel Impedance Value: 156.606
Lead Channel Impedance Value: 156.606
Lead Channel Impedance Value: 304 Ohm
Lead Channel Impedance Value: 304 Ohm
Lead Channel Impedance Value: 304 Ohm
Lead Channel Impedance Value: 304 Ohm
Lead Channel Impedance Value: 323 Ohm
Lead Channel Impedance Value: 323 Ohm
Lead Channel Impedance Value: 323 Ohm
Lead Channel Impedance Value: 342 Ohm
Lead Channel Impedance Value: 494 Ohm
Lead Channel Impedance Value: 494 Ohm
Lead Channel Impedance Value: 513 Ohm
Lead Channel Impedance Value: 513 Ohm
Lead Channel Impedance Value: 513 Ohm
Lead Channel Pacing Threshold Amplitude: 0.5 V
Lead Channel Pacing Threshold Amplitude: 0.5 V
Lead Channel Pacing Threshold Pulse Width: 0.4 ms
Lead Channel Pacing Threshold Pulse Width: 0.4 ms
Lead Channel Sensing Intrinsic Amplitude: 11.25 mV
Lead Channel Sensing Intrinsic Amplitude: 11.25 mV
Lead Channel Sensing Intrinsic Amplitude: 2.5 mV
Lead Channel Sensing Intrinsic Amplitude: 2.5 mV
Lead Channel Setting Pacing Amplitude: 1.5 V
Lead Channel Setting Pacing Amplitude: 2 V
Lead Channel Setting Pacing Amplitude: 2.5 V
Lead Channel Setting Pacing Pulse Width: 0.4 ms
Lead Channel Setting Pacing Pulse Width: 0.4 ms
Lead Channel Setting Sensing Sensitivity: 0.3 mV
Zone Setting Status: 755011
Zone Setting Status: 755011

## 2022-10-12 ENCOUNTER — Encounter (HOSPITAL_COMMUNITY): Payer: Medicare HMO

## 2022-10-23 ENCOUNTER — Ambulatory Visit (INDEPENDENT_AMBULATORY_CARE_PROVIDER_SITE_OTHER): Payer: Medicare HMO

## 2022-10-23 DIAGNOSIS — I5022 Chronic systolic (congestive) heart failure: Secondary | ICD-10-CM | POA: Diagnosis not present

## 2022-10-23 DIAGNOSIS — Z95810 Presence of automatic (implantable) cardiac defibrillator: Secondary | ICD-10-CM | POA: Diagnosis not present

## 2022-10-30 NOTE — Progress Notes (Signed)
EPIC Encounter for ICM Monitoring  Patient Name: Jeanne Bruce is a 67 y.o. female Date: 10/30/2022 Primary Care Physican: Derrill Center., MD Primary Cardiologist: Aundra Dubin Electrophysiologist: Curt Bears Bi-V Pacing: 97.3%        01/09/2022 Office Weight: 160 lbs  01/31/2022 Weight: 150-155 lbs 04/11/2022 Weight: 155-160 lbs  Time in AT/AF <0.1 hr/day (<0.1%)                                                            Transmission reviewed.    Optivol thoracic impedance suggesting normal fluid levels with intermittent days suggesting possible fluid accumulation within last month.    Prescribed:  Furosemide 20 mg take 1 tablet by mouth daily    Labs:  06/01/2022 Creatinine 2.50, BUN 37, Potassium 3.5, Sodium 140, GFR 20 02/15/2022 Creatinine 2.84, BUN 63, Potassium 4.1, Sodium 138, GFR 18 02/10/2022 Creatinine 3.01, BUN 42, Potassium 3.0, Sodium 138, GFR 16 02/09/2022 Creatinine 3.23, BUN 43, Potassium 2.9, Sodium 139, GFR 15 01/05/2022 Creatinine 2.76, BUN 45, Potassium 3.7, Sodium 139, GFR 18 Care Everywhere 12/01/2021 Creatinine 2.55, BUN 43, Potassium 4.1, Sodium 142, GFR 20 Care Everywhere  A complete set of results can be found in Results Review.   Recommendations:  No changes   Follow-up plan: ICM clinic phone appointment on 11/27/2022.   91 day device clinic remote transmission 01/01/2023.    EP/Cardiology Office Visits:  11/07/2022 with HF clinic.  Recall 07/08/2022 with Dr. Curt Bears.       Copy of ICM check sent to Dr. Curt Bears.  3 month ICM trend: 10/23/2022.    12-14 Month ICM trend:     Rosalene Billings, RN 10/30/2022 9:03 AM

## 2022-11-01 ENCOUNTER — Encounter (HOSPITAL_COMMUNITY): Payer: Medicare HMO

## 2022-11-07 ENCOUNTER — Ambulatory Visit (HOSPITAL_COMMUNITY)
Admission: RE | Admit: 2022-11-07 | Discharge: 2022-11-07 | Disposition: A | Discharge status: Home / Self Care | Payer: Medicare HMO | Source: Ambulatory Visit | Attending: Family Medicine | Admitting: Family Medicine

## 2022-11-07 ENCOUNTER — Encounter (HOSPITAL_COMMUNITY): Payer: Self-pay

## 2022-11-07 VITALS — BP 110/80 | HR 75 | Wt 170.0 lb

## 2022-11-07 DIAGNOSIS — Z79899 Other long term (current) drug therapy: Secondary | ICD-10-CM | POA: Diagnosis not present

## 2022-11-07 DIAGNOSIS — N184 Chronic kidney disease, stage 4 (severe): Secondary | ICD-10-CM | POA: Insufficient documentation

## 2022-11-07 DIAGNOSIS — Z8679 Personal history of other diseases of the circulatory system: Secondary | ICD-10-CM | POA: Insufficient documentation

## 2022-11-07 DIAGNOSIS — I428 Other cardiomyopathies: Secondary | ICD-10-CM | POA: Insufficient documentation

## 2022-11-07 DIAGNOSIS — I130 Hypertensive heart and chronic kidney disease with heart failure and stage 1 through stage 4 chronic kidney disease, or unspecified chronic kidney disease: Secondary | ICD-10-CM | POA: Diagnosis not present

## 2022-11-07 DIAGNOSIS — Z905 Acquired absence of kidney: Secondary | ICD-10-CM | POA: Insufficient documentation

## 2022-11-07 DIAGNOSIS — I480 Paroxysmal atrial fibrillation: Secondary | ICD-10-CM | POA: Insufficient documentation

## 2022-11-07 DIAGNOSIS — Z7901 Long term (current) use of anticoagulants: Secondary | ICD-10-CM | POA: Diagnosis not present

## 2022-11-07 DIAGNOSIS — I5022 Chronic systolic (congestive) heart failure: Secondary | ICD-10-CM | POA: Insufficient documentation

## 2022-11-07 NOTE — Patient Instructions (Signed)
It was great to see you today! No medication changes are needed at this time.  Your physician wants you to follow-up in: 4 months with Dr Aundra Dubin. You will receive a reminder letter in the mail two months in advance. If you don't receive a letter, please call our office to schedule the follow-up appointment.   Do the following things EVERYDAY: Weigh yourself in the morning before breakfast. Write it down and keep it in a log. Take your medicines as prescribed Eat low salt foods--Limit salt (sodium) to 2000 mg per day.  Stay as active as you can everyday Limit all fluids for the day to less than 2 liters  At the [facility], you and your health needs are our priority. As part of our continuing mission to provide you with exceptional heart care, we have created designated Provider Care Teams. These Care Teams include your primary Cardiologist (physician) and Advanced Practice Providers (APPs- Physician Assistants and Nurse Practitioners) who all work together to provide you with the care you need, when you need it.   You may see any of the following providers on your designated Care Team at your next follow up: Dr Glori Bickers Dr Loralie Champagne Dr. Roxana Hires, NP Lyda Jester, [place] [facility] [place], [place] Forestine Na, NP Audry Riles, PharmD   Please be sure to bring in all your medications bottles to every appointment.   If you have any questions or concerns before your next appointment please send [place] a message through [place] or call our office at [phone].    TO LEAVE A MESSAGE FOR THE NURSE SELECT OPTION 2, PLEASE LEAVE A MESSAGE INCLUDING: YOUR NAME DATE OF BIRTH CALL BACK NUMBER REASON FOR CALL**this is important as we prioritize the call backs  YOU WILL RECEIVE A CALL BACK THE SAME DAY AS LONG AS YOU CALL BEFORE 4:00 PM

## 2022-11-07 NOTE — Progress Notes (Signed)
Remote ICD transmission.   

## 2022-11-07 NOTE — Progress Notes (Signed)
Advanced Heart Failure Clinic Note   Date:  11/07/2022  PCP:  Derrill Center., MD  Cardiologist:  Fransico Him, MD HF Cardioligist: Dr. Aundra Dubin  HPI: Jeanne Bruce is a 67 y.o. female with history of CKD stage 4, chronic systolic CHF, multiple sclerosis, and right nephrectomy.  She was referred by Dr. Radford Pax for CHF evaluation.  Patient also had remote colon cancer with colostomy.  She has had frequent UTIs and was admitted in 10/20 with urosepsis.  She had respiratory arrest and VF arrest in the ER and was intubated.  Echo was done, showing EF 25% with apical ballooning.  LHC showed no significant coronary disease.  She ended up having right nephrectomy for renal abscess in 10/20.    Echo was repeated in 12/20, showing EF still low at 25-30% with peri-apical akinesis.   She has had a hard time taking cardiac medications due to orthostatic symptoms with low BP.  She was sent home from the hospital in 10/20 on hydralazine/Imdur but had to stop due to low BP.    Echo in 4/21 showed EF 25% with septal-lateral dyssynchrony, normal RV.  Medtronic CRT-D device was placed in 6/21.  Repeat echo in 1/22 showed EF up to 50-55% with normal RV.   Seen for virtual visit 04/22 d/t COVID-19 pandemic. Had been doing well. No med changes. Noted drop in BiV pacing percentage with episodes of atrial fibrillation. F/u recommended in 3 months.  Furosemide 20 mg daily started on 01/30/22 due to elevated OptiVol fluid index.   Echo 8/23, EF 50-55%, normal RV, normal IVC.    Today she returns for HF follow up. Overall feeling fine. She does not have dyspnea with walking further distances on flat ground, mild dyspnea walking up steps. Denies palpitations, abnormal bleeding, CP, dizziness, edema, or PND/Orthopnea. Appetite ok. No fever or chills. Weight at home 170 pounds. Taking all medications. Sees Nephrologist at [facility], turned down for renal transplant.  MDT device interrogation: >99% BiV paced, stable  thoracic impedance, no recent AF/VT, 3 hr/day activity (personally reviewed).  Labs (11/20): creatinine 1.5 Labs (12/20): creatinine 2.47 Labs (1/21): creatinine 2.1 => 2.04, BNP 54 Labs (6/21): creatinine 2.33 Labs (7/21): K 5.7, creatinine 2.61 Labs (9/21): K 4.5, creatinine 2.38 Labs (4/22): K 5.5, creatinine 2.5, LDL 91 Labs (8/23): K 3.5, creatinine 2.5, hgb 11.6, LDL 97 Labs (1/24): K 3.5, creatinine 2.7  PMH: 1. H/o right nephrectomy for renal abscess in 10/20.  2. Multiple Sclerosis 3. HTN 4. H/o colon cancer: s/p colectomy with colostomy.  5. CKD: Stage 4. Sees a nephrologist at [facility].  6. Frequent UTIs.  7. Chronic systolic CHF: Nonischemic cardiomyopathy. Medtronic CRT-D device.  - Echo (10/20): EF 25%, apical ballooning.  - LHC (10/20): Normal coronaries.  - Echo (12/20): EF 25-30%, peri-apical akinesis, normal RV size and systolic function (similar to 10/20 echo).   - Echo (4/21): EF 25%, septal-lateral dyssynchrony - Echo (1/22): EF 50-55%, normal RV.  - Echo (8/23): EF 50-55%, normal RV, normal IVC.  8. LBBB 9. Atrial fibrillation: Paroxysmal.   Social History   Socioeconomic History   Marital status: Married    Spouse name: Not on file   Number of children: Not on file   Years of education: Not on file   Highest education level: Not on file  Occupational History   Not on file  Tobacco Use   Smoking status: Never   Smokeless tobacco: Never  Substance and Sexual Activity   Alcohol  use: Not Currently   Drug use: Never   Sexual activity: Not on file  Other Topics Concern   Not on file  Social History Narrative   Not on file   Social Determinants of Health   Financial Resource Strain: Not on file  Food Insecurity: Not on file  Transportation Needs: No Transportation Needs (11/25/2019)   PRAPARE - Hydrologist (Medical): No    Lack of Transportation (Non-Medical): No  Physical Activity: Insufficiently Active (11/25/2019)    Exercise Vital Sign    Days of Exercise per Week: 2 days    Minutes of Exercise per Session: 10 min  Stress: Not on file  Social Connections: Unknown (11/25/2019)   Social Connection and Isolation Panel [NHANES]    Frequency of Communication with Friends and Family: More than three times a week    Frequency of Social Gatherings with Friends and Family: Twice a week    Attends Religious Services: More than 4 times per year    Active Member of [employer] or Organizations: Not on file    Attends Archivist Meetings: Not on file    Marital Status: Married  Human resources officer Violence: Not on file   Family History  Problem Relation Age of Onset   Atrial fibrillation Father    Diabetes Mellitus II Neg Hx    ROS: All systems reviewed and negative except as per HPI.   Current Outpatient Medications  Medication Sig Dispense Refill   carvedilol (COREG) 6.25 MG tablet TAKE 1 TABLET(6.25 MG) BY MOUTH TWICE DAILY WITH A MEAL 180 tablet 3   cholecalciferol (VITAMIN D3) 25 MCG (1000 UT) tablet Take 1,000 Units by mouth in the morning and at bedtime.      Cyanocobalamin (VITAMIN B12 PO) Take 1 tablet by mouth daily.      ELIQUIS 5 MG TABS tablet TAKE 1 TABLET(5 MG) BY MOUTH TWICE DAILY 60 tablet 11   furosemide (LASIX) 20 MG tablet TAKE 1 TABLET(20 MG) BY MOUTH DAILY 90 tablet 3   magnesium oxide (MAG-OX) 400 MG tablet Take 400 mg by mouth daily.     Probiotic Product (PROBIOTIC DAILY PO) Take 1 capsule by mouth daily.     No current facility-administered medications for this encounter.   Wt Readings from Last 3 Encounters:  11/07/22 77.1 kg (170 lb)  06/09/22 74.8 kg (164 lb 12.8 oz)  03/02/22 72.8 kg (160 lb 9.6 oz)   BP 110/80   Pulse 75   Wt 77.1 kg (170 lb)   SpO2 100%   BMI 32.12 kg/m   Physical Exam:   General:  NAD. No resp difficulty, walked into clinic HEENT: Normal Neck: Supple. No JVD. Carotids 2+ bilat; no bruits. No lymphadenopathy or thryomegaly appreciated. Cor: PMI  nondisplaced. Regular rate & rhythm. No rubs, gallops or murmurs. Lungs: Clear Abdomen: Soft, nontender, nondistended. No hepatosplenomegaly. No bruits or masses. Good bowel sounds. Extremities: No cyanosis, clubbing, rash, edema Neuro: Alert & oriented x 3, cranial nerves grossly intact. Moves all 4 extremities w/o difficulty. Affect pleasant.  Assessment/Plan:  1. Chronic systolic CHF: Nonischemic cardiomyopathy, LHC in 10/20 with no significant coronary disease.  Echo in 10/20 was suggestive of stress (Takotsubo-type) cardiomyopathy with apical ballooning (from severe urosepsis).  However, LV function did not improve on repeat echo in 12/20 or echo in 4/21 (EF still 25%).  ?LBBB cardiomyopathy.  She is now s/p Medtronic CRT-D device. Repeat echo in 1/22 showed EF up to 50-55%, good  response to CRT.  Echo 8/23 showed EF 50-55%.  NYHA class II symptoms, not volume overloaded by exam or Optivol.  Medical therapy limited by orthostasis and CKD IV. - Continue Lasix 20 mg daily. Recent labs reviewed and look OK; K 3.5, creatinine 2.7.  - Continue Coreg 6.25 mg bid.  - No ACEI/ARNI/ARB for now with CKD stage 4.  - Off spironolactone due to hyperkalemia.   - Off Jardiance with GFR < 20. 2. CKD stage 4: S/p right nephrectomy in 10/20.  Sees nephrology at [facility]. Creatinine 2.5 most recently.  - She was turned down for renal transplant. 3. Atrial fibrillation: Paroxysmal.  Noted after device implantation.  No recent episodes.   - Continue apixaban 5 mg bid. Hgb 12.3 (10/19/22). - No recent AF, if becomes more frequent and impedes BiV pacing, may need ablation.   Follow up in 4 months with Dr. Aundra Dubin.  Rafael Bihari, FNP  11/07/2022  [facility] [address] [facility] [place] [address] [phone] (office) [phone] (fax)

## 2022-11-27 ENCOUNTER — Ambulatory Visit: Payer: Medicare HMO | Attending: Cardiology

## 2022-11-27 DIAGNOSIS — Z95810 Presence of automatic (implantable) cardiac defibrillator: Secondary | ICD-10-CM

## 2022-11-27 DIAGNOSIS — I5022 Chronic systolic (congestive) heart failure: Secondary | ICD-10-CM | POA: Diagnosis not present

## 2022-12-04 NOTE — Progress Notes (Signed)
EPIC Encounter for ICM Monitoring  Patient Name: Jeanne Bruce is a 67 y.o. female Date: 12/04/2022 Primary Care Physican: Derrill Center., MD Primary Cardiologist: Aundra Dubin Electrophysiologist: Curt Bears Bi-V Pacing: 97.8%        01/09/2022 Office Weight: 160 lbs  01/31/2022 Weight: 150-155 lbs 04/11/2022 Weight: 155-160 lbs 1/23//2024 Office Weight: 170 lbs   Time in AT/AF  <0.1 hr/day (<0.1%)                                                            Transmission reviewed.    Optivol thoracic impedance suggesting normal fluid levels with intermittent days suggesting possible fluid accumulation within last month.    Prescribed:  Furosemide 20 mg take 1 tablet by mouth daily    Labs:  10/19/2022 Creatinine 2.7,   BUN 46, Potassium 3.5, Sodium 140, GFR 19 06/01/2022 Creatinine 2.50, BUN 37, Potassium 3.5, Sodium 140, GFR 20 02/15/2022 Creatinine 2.84, BUN 63, Potassium 4.1, Sodium 138, GFR 18 02/10/2022 Creatinine 3.01, BUN 42, Potassium 3.0, Sodium 138, GFR 16 02/09/2022 Creatinine 3.23, BUN 43, Potassium 2.9, Sodium 139, GFR 15 01/05/2022 Creatinine 2.76, BUN 45, Potassium 3.7, Sodium 139, GFR 18 Care Everywhere 12/01/2021 Creatinine 2.55, BUN 43, Potassium 4.1, Sodium 142, GFR 20 Care Everywhere  A complete set of results can be found in Results Review.   Recommendations:  No changes   Follow-up plan: ICM clinic phone appointment on 01/02/2023.   91 day device clinic remote transmission 01/01/2023.     EP/Cardiology Office Visits:  03/08/2023 with Dr Aundra Dubin.  Recall 07/08/2022 with Dr. Curt Bears.       Copy of ICM check sent to Dr. Curt Bears.   3 month ICM trend: 11/27/2022.    12-14 Month ICM trend:     Rosalene Billings, RN 12/04/2022 3:50 PM

## 2023-01-01 ENCOUNTER — Ambulatory Visit (INDEPENDENT_AMBULATORY_CARE_PROVIDER_SITE_OTHER): Payer: Medicare HMO

## 2023-01-01 DIAGNOSIS — I420 Dilated cardiomyopathy: Secondary | ICD-10-CM

## 2023-01-01 DIAGNOSIS — I5022 Chronic systolic (congestive) heart failure: Secondary | ICD-10-CM

## 2023-01-02 ENCOUNTER — Ambulatory Visit: Payer: Medicare HMO | Attending: Cardiology

## 2023-01-02 DIAGNOSIS — I5022 Chronic systolic (congestive) heart failure: Secondary | ICD-10-CM

## 2023-01-02 DIAGNOSIS — Z95810 Presence of automatic (implantable) cardiac defibrillator: Secondary | ICD-10-CM | POA: Diagnosis not present

## 2023-01-02 LAB — CUP PACEART REMOTE DEVICE CHECK
Battery Remaining Longevity: 84 mo
Battery Voltage: 3 V
Brady Statistic AP VP Percent: 0.02 %
Brady Statistic AP VS Percent: 0 %
Brady Statistic AS VP Percent: 97.16 %
Brady Statistic AS VS Percent: 2.82 %
Brady Statistic RA Percent Paced: 0.02 %
Brady Statistic RV Percent Paced: 6.9 %
Date Time Interrogation Session: 20240318043824
HighPow Impedance: 72 Ohm
Implantable Lead Connection Status: 753985
Implantable Lead Connection Status: 753985
Implantable Lead Connection Status: 753985
Implantable Lead Implant Date: 20210621
Implantable Lead Implant Date: 20210621
Implantable Lead Implant Date: 20210621
Implantable Lead Location: 753858
Implantable Lead Location: 753859
Implantable Lead Location: 753860
Implantable Lead Model: 4398
Implantable Lead Model: 5076
Implantable Pulse Generator Implant Date: 20210621
Lead Channel Impedance Value: 141.867
Lead Channel Impedance Value: 145.871
Lead Channel Impedance Value: 145.871
Lead Channel Impedance Value: 156.606
Lead Channel Impedance Value: 161.5 Ohm
Lead Channel Impedance Value: 266 Ohm
Lead Channel Impedance Value: 304 Ohm
Lead Channel Impedance Value: 304 Ohm
Lead Channel Impedance Value: 323 Ohm
Lead Channel Impedance Value: 323 Ohm
Lead Channel Impedance Value: 342 Ohm
Lead Channel Impedance Value: 342 Ohm
Lead Channel Impedance Value: 380 Ohm
Lead Channel Impedance Value: 513 Ohm
Lead Channel Impedance Value: 513 Ohm
Lead Channel Impedance Value: 513 Ohm
Lead Channel Impedance Value: 513 Ohm
Lead Channel Impedance Value: 532 Ohm
Lead Channel Pacing Threshold Amplitude: 0.5 V
Lead Channel Pacing Threshold Amplitude: 0.625 V
Lead Channel Pacing Threshold Pulse Width: 0.4 ms
Lead Channel Pacing Threshold Pulse Width: 0.4 ms
Lead Channel Sensing Intrinsic Amplitude: 12.625 mV
Lead Channel Sensing Intrinsic Amplitude: 12.625 mV
Lead Channel Sensing Intrinsic Amplitude: 2.375 mV
Lead Channel Sensing Intrinsic Amplitude: 2.375 mV
Lead Channel Setting Pacing Amplitude: 1.5 V
Lead Channel Setting Pacing Amplitude: 2 V
Lead Channel Setting Pacing Amplitude: 2.5 V
Lead Channel Setting Pacing Pulse Width: 0.4 ms
Lead Channel Setting Pacing Pulse Width: 0.4 ms
Lead Channel Setting Sensing Sensitivity: 0.3 mV
Zone Setting Status: 755011
Zone Setting Status: 755011

## 2023-01-05 ENCOUNTER — Telehealth: Payer: Self-pay

## 2023-01-05 NOTE — Progress Notes (Signed)
EPIC Encounter for ICM Monitoring  Patient Name: Jeanne Bruce is a 67 y.o. female Date: 01/05/2023 Primary Care Physican: Derrill Center., MD Primary Cardiologist: Aundra Dubin Electrophysiologist: Curt Bears Bi-V Pacing: 96.9%        01/09/2022 Office Weight: 160 lbs  01/31/2022 Weight: 150-155 lbs 04/11/2022 Weight: 155-160 lbs 1/23//2024 Office Weight: 170 lbs   Time in AT/AF  <0.1 hr/day (<0.1%)                                                            Attempted call to patient and unable to reach.  Transmission reviewed.    Optivol thoracic impedance suggesting normal fluid levels.   Prescribed:  Furosemide 20 mg take 1 tablet by mouth daily    Labs:  12/05/2022 Creatinine 2.50, BUN 40, Potassium 3.7, Sodium 139, GFR 20 10/19/2022 Creatinine 2.7,   BUN 46, Potassium 3.5, Sodium 140, GFR 19 06/01/2022 Creatinine 2.50, BUN 37, Potassium 3.5, Sodium 140, GFR 20 A complete set of results can be found in Results Review.   Recommendations:  Unable to reach.     Follow-up plan: ICM clinic phone appointment on 02/05/2023.   91 day device clinic remote transmission 04/02/2023.     EP/Cardiology Office Visits:  03/08/2023 with Dr Aundra Dubin.  Recall 07/08/2022 with Dr. Curt Bears.       Copy of ICM check sent to Dr. Curt Bears.  3 month ICM trend: 01/01/2023.    12-14 Month ICM trend:     Rosalene Billings, RN 01/05/2023 4:55 PM

## 2023-01-05 NOTE — Telephone Encounter (Signed)
Remote ICM transmission received.  Attempted call to patient regarding ICM remote transmission and no answer.  

## 2023-02-05 ENCOUNTER — Ambulatory Visit: Payer: Medicare HMO | Attending: Cardiology

## 2023-02-05 DIAGNOSIS — I5022 Chronic systolic (congestive) heart failure: Secondary | ICD-10-CM | POA: Diagnosis not present

## 2023-02-05 DIAGNOSIS — Z95810 Presence of automatic (implantable) cardiac defibrillator: Secondary | ICD-10-CM

## 2023-02-06 ENCOUNTER — Telehealth: Payer: Self-pay

## 2023-02-06 NOTE — Progress Notes (Signed)
EPIC Encounter for ICM Monitoring  Patient Name: Jeanne Bruce is a 67 y.o. female Date: 02/06/2023 Primary Care Physican: Elijio Miles., MD Primary Cardiologist: Shirlee Latch Electrophysiologist: Elberta Fortis Bi-V Pacing: 98%        01/09/2022 Office Weight: 160 lbs  01/31/2022 Weight: 150-155 lbs 04/11/2022 Weight: 155-160 lbs 1/23//2024 Office Weight: 170 lbs   Time in AT/AF  <0.1 hr/day (<0.1%)                                                            Attempted call to patient and unable to reach.  Transmission reviewed.    Optivol thoracic impedance suggesting possible fluid accumulation starting 4/13.   Prescribed:  Furosemide 20 mg take 1 tablet by mouth daily    Labs:  12/05/2022 Creatinine 2.50, BUN 40, Potassium 3.7, Sodium 139, GFR 20 10/19/2022 Creatinine 2.7,   BUN 46, Potassium 3.5, Sodium 140, GFR 19 06/01/2022 Creatinine 2.50, BUN 37, Potassium 3.5, Sodium 140, GFR 20 A complete set of results can be found in Results Review.   Recommendations:  Unable to reach.     Follow-up plan: ICM clinic phone appointment on 02/13/2023 to recheck fluid levels.   91 day device clinic remote transmission 04/02/2023.     EP/Cardiology Office Visits:  03/08/2023 with Dr Shirlee Latch.  Recall 07/08/2022 with Dr. Elberta Fortis.       Copy of ICM check sent to Dr. Elberta Fortis.  3 month ICM trend: 02/05/2023.    12-14 Month ICM trend:     Karie Soda, RN 02/06/2023 2:45 PM

## 2023-02-06 NOTE — Telephone Encounter (Signed)
Remote ICM transmission received.  Attempted call to patient regarding ICM remote transmission and left message to return call   

## 2023-02-07 NOTE — Progress Notes (Signed)
Spoke with patient and heart failure questions reviewed.  Transmission results reviewed.  Pt asymptomatic for fluid accumulation.  Reports feeling well at this time and voices no complaints.    She has been eating restaurant foods more in the last couple of weeks but unsure what may be causing fluid accumulation.  She is compliant with taking Lasix 20 mg daily.  Weight stable at 165 lbs.  Provided EP scheduler number and advised to call for overdue appointment with Dr Elberta Fortis.  Copy sent to Prince Rome, NP for review and recommendations if needed.

## 2023-02-07 NOTE — Progress Notes (Signed)
Attempted call to patient to provide recommendations and left message to return call.  

## 2023-02-07 NOTE — Progress Notes (Signed)
  Received: Today Milford, Anderson Malta, FNP  Debralee Braaksma, Josephine Igo, RN Sure. She can increase Lasix to 40 mg daily alternating with 20 mg every other day. Please take 10 KCL on days she takes 40 mg of lasix. Will need a BMET in 10 days

## 2023-02-09 MED ORDER — POTASSIUM CHLORIDE ER 10 MEQ PO TBCR: EXTENDED_RELEASE_TABLET | ORAL | 3 refills | Status: DC

## 2023-02-09 MED ORDER — FUROSEMIDE 20 MG PO TABS: ORAL_TABLET | ORAL | 3 refills | Status: DC

## 2023-02-09 NOTE — Progress Notes (Addendum)
Spoke with patient and advised Jeanne Rome, NP recommends to increase Furosemide to 2 tablets (40 mg total) by mouth every other day Alternating with 1 tablet (20 mg total) every other day.  New prescription of Potassium 10 mEq take 1 tablet (10 mEq total) by mouth on days she takes Furosemide 40 mg.   BMET in 10 days and scheduled for 5/6 at HF clinic.  Pt agreeable to recommendations and repeated instructions correctly.   She has enough Furosemide on hand to take additional tablet and will call for refill when needed.    Advised to call back for any questions.  Will recheck fluid levels on 4/30

## 2023-02-09 NOTE — Progress Notes (Signed)
Spoke with patient and advised of Jessica Milford's at HF clinic recommendation regarding Lasix, potassium and bmet.      Obtained new remote transmission 4/26 to check fluid levels prior to implementing recommendations.  Copy sent, to Prince Rome, NP with HF clinic, with updated remote transmission and asking if patient should continue current dosage of Lasix 20 mg daily?  OR should she start new Lasix dosage of 40 mg every other day alternating with 20 mg every other day, add Potassium 10 meq daily and get BMET in 10 days.      4/26 Optivol thoracic impedance suggesting fluid levels returned to normal prior to Lasix change.

## 2023-02-09 NOTE — Addendum Note (Signed)
Addended by: Karie Soda on: 02/09/2023 11:04 AM   Modules accepted: Orders

## 2023-02-09 NOTE — Progress Notes (Signed)
Remote ICD transmission.   

## 2023-02-09 NOTE — Progress Notes (Signed)
  Received: Today Milford, Anderson Malta, FNP  Ireland Chagnon, Josephine Igo, RN Would still advise she increase her lasix on alternating days, with KCL and repeat BMET

## 2023-02-13 ENCOUNTER — Ambulatory Visit: Payer: Medicare HMO | Attending: Cardiology

## 2023-02-13 DIAGNOSIS — Z95810 Presence of automatic (implantable) cardiac defibrillator: Secondary | ICD-10-CM

## 2023-02-13 DIAGNOSIS — I5022 Chronic systolic (congestive) heart failure: Secondary | ICD-10-CM

## 2023-02-13 NOTE — Progress Notes (Signed)
EPIC Encounter for ICM Monitoring  Patient Name: Jeanne Bruce is a 67 y.o. female Date: 02/13/2023 Primary Care Physican: Elijio Miles., MD Primary Cardiologist: Shirlee Latch Electrophysiologist: Elberta Fortis Bi-V Pacing: 98%        01/09/2022 Office Weight: 160 lbs  01/31/2022 Weight: 150-155 lbs 04/11/2022 Weight: 155-160 lbs 1/23//2024 Office Weight: 170 lbs 02/13/2023 Weight: 165 lbs   Time in AT/AF  <0.1 hr/day (<0.1%)                                                            Spoke with patient and heart failure questions reviewed.  Transmission results reviewed.  Pt asymptomatic for fluid accumulation.  Reports feeling well at this time and voices no complaints.     Optivol thoracic impedance suggesting fluid levels returned to normal after taking increased Furosemide dosage.    Prescribed:  Furosemide 20 mg take 1 tablet by mouth every other day alternating with 40 mg every other day Potassium 10 mEq take 1 tablet by mouth on the days that Furosemide 40 mg is taken.   Labs:  02/19/2023 BMET scheduled 12/05/2022 Creatinine 2.50, BUN 40, Potassium 3.7, Sodium 139, GFR 20 10/19/2022 Creatinine 2.7,   BUN 46, Potassium 3.5, Sodium 140, GFR 19 06/01/2022 Creatinine 2.50, BUN 37, Potassium 3.5, Sodium 140, GFR 20 A complete set of results can be found in Results Review.   Recommendations:  No changes and encouraged to call if experiencing any fluid symptoms.  Advised to continue with current Lasix dosage and reminded of 5/6 lab appointment.   Follow-up plan: ICM clinic phone appointment on 03/13/2023.   91 day device clinic remote transmission 04/02/2023.     EP/Cardiology Office Visits:  Recall 03/08/2023 with Dr Shirlee Latch.  03/22/2023 with Dr. Elberta Fortis.       Copy of ICM check sent to Dr. Elberta Fortis.   3 month ICM trend: 02/12/2023.    12-14 Month ICM trend:     Karie Soda, RN 02/13/2023 3:24 PM

## 2023-02-19 ENCOUNTER — Ambulatory Visit (HOSPITAL_COMMUNITY)
Admission: RE | Admit: 2023-02-19 | Discharge: 2023-02-19 | Disposition: A | Discharge status: Home / Self Care | Payer: Medicare HMO | Source: Ambulatory Visit | Attending: Internal Medicine | Admitting: Internal Medicine

## 2023-02-19 DIAGNOSIS — I5022 Chronic systolic (congestive) heart failure: Secondary | ICD-10-CM | POA: Insufficient documentation

## 2023-02-19 LAB — BASIC METABOLIC PANEL
Anion gap: 14 (ref 5–15)
BUN: 51 mg/dL — ABNORMAL HIGH (ref 8–23)
CO2: 25 mmol/L (ref 22–32)
Calcium: 10.4 mg/dL — ABNORMAL HIGH (ref 8.9–10.3)
Chloride: 101 mmol/L (ref 98–111)
Creatinine, Ser: 2.81 mg/dL — ABNORMAL HIGH (ref 0.44–1.00)
GFR, Estimated: 18 mL/min — ABNORMAL LOW (ref >60)
Glucose, Bld: 219 mg/dL — ABNORMAL HIGH (ref 70–99)
Potassium: 4.1 mmol/L (ref 3.5–5.1)
Sodium: 140 mmol/L (ref 135–145)

## 2023-03-13 ENCOUNTER — Ambulatory Visit: Payer: Medicare HMO | Attending: Cardiology

## 2023-03-13 DIAGNOSIS — Z95810 Presence of automatic (implantable) cardiac defibrillator: Secondary | ICD-10-CM | POA: Diagnosis not present

## 2023-03-13 DIAGNOSIS — I5022 Chronic systolic (congestive) heart failure: Secondary | ICD-10-CM

## 2023-03-16 ENCOUNTER — Telehealth: Payer: Self-pay

## 2023-03-16 NOTE — Telephone Encounter (Signed)
Remote ICM transmission received.  Attempted call to patient regarding ICM remote transmission and left message per DPR with ICM phone number to return call.    

## 2023-03-16 NOTE — Progress Notes (Signed)
EPIC Encounter for ICM Monitoring  Patient Name: Jeanne Bruce is a 67 y.o. female Date: 03/16/2023 Primary Care Physican: Elijio Miles., MD Primary Cardiologist: Shirlee Latch Electrophysiologist: Elberta Fortis Bi-V Pacing: 98.3%        01/09/2022 Office Weight: 160 lbs  01/31/2022 Weight: 150-155 lbs 04/11/2022 Weight: 155-160 lbs 1/23//2024 Office Weight: 170 lbs 02/13/2023 Weight: 165 lbs   Time in AT/AF  <0.1 hr/day (<0.1%)                                                            Attempted call to patient and unable to reach.  Left message to return call. Transmission reviewed.    Optivol thoracic impedance suggesting normal fluid levels.    Prescribed:  Furosemide 20 mg take 1 tablet by mouth every other day alternating with 40 mg every other day Potassium 10 mEq take 1 tablet by mouth on the days that Furosemide 40 mg is taken.   Labs:  02/19/2023 Creatinine 2.81, BUN 51, Potassium 4.1, Sodium 140, GFR 18 12/05/2022 Creatinine 2.50, BUN 40, Potassium 3.7, Sodium 139, GFR 20 10/19/2022 Creatinine 2.7,   BUN 46, Potassium 3.5, Sodium 140, GFR 19 06/01/2022 Creatinine 2.50, BUN 37, Potassium 3.5, Sodium 140, GFR 20 A complete set of results can be found in Results Review.   Recommendations:  Unable to reach.     Follow-up plan: ICM clinic phone appointment on 04/16/2023.   91 day device clinic remote transmission 04/02/2023.     EP/Cardiology Office Visits:    03/22/2023 with Dr. Elberta Fortis.       Copy of ICM check sent to Dr. Elberta Fortis.  3 month ICM trend: 03/13/2023.    12-14 Month ICM trend:     Karie Soda, RN 03/16/2023 10:29 AM

## 2023-03-22 ENCOUNTER — Encounter: Payer: Self-pay | Admitting: Cardiology

## 2023-03-22 ENCOUNTER — Ambulatory Visit: Payer: Medicare HMO | Attending: Cardiology | Admitting: Cardiology

## 2023-03-22 VITALS — BP 126/80 | HR 66 | Ht 61.0 in | Wt 170.0 lb

## 2023-03-22 DIAGNOSIS — I480 Paroxysmal atrial fibrillation: Secondary | ICD-10-CM | POA: Diagnosis not present

## 2023-03-22 DIAGNOSIS — I10 Essential (primary) hypertension: Secondary | ICD-10-CM | POA: Diagnosis not present

## 2023-03-22 DIAGNOSIS — D6869 Other thrombophilia: Secondary | ICD-10-CM | POA: Diagnosis not present

## 2023-03-22 DIAGNOSIS — I5022 Chronic systolic (congestive) heart failure: Secondary | ICD-10-CM | POA: Diagnosis not present

## 2023-03-22 LAB — CUP PACEART INCLINIC DEVICE CHECK
Date Time Interrogation Session: 20240606124942
Implantable Lead Connection Status: 753985
Implantable Lead Connection Status: 753985
Implantable Lead Connection Status: 753985
Implantable Lead Implant Date: 20210621
Implantable Lead Implant Date: 20210621
Implantable Lead Implant Date: 20210621
Implantable Lead Location: 753858
Implantable Lead Location: 753859
Implantable Lead Location: 753860
Implantable Lead Model: 4398
Implantable Lead Model: 5076
Implantable Pulse Generator Implant Date: 20210621

## 2023-03-22 NOTE — Patient Instructions (Addendum)
Medication Instructions:  Your physician recommends that you continue on your current medications as directed. Please refer to the Current Medication list given to you today. *If you need a refill on your cardiac medications before your next appointment, please call your pharmacy*   Follow-Up: At [facility], you and your health needs are our priority.  As part of our continuing mission to provide you with exceptional heart care, we have created designated Provider Care Teams.  These Care Teams include your primary Cardiologist (physician) and Advanced Practice Providers (APPs -  Physician Assistants and Nurse Practitioners) who all work together to provide you with the care you need, when you need it.  We recommend signing up for the patient portal called "MyChart".  Sign up information is provided on this After Visit Summary.  MyChart is used to connect with patients for Virtual Visits (Telemedicine).  Patients are able to view lab/test results, encounter notes, upcoming appointments, etc.  Non-urgent messages can be sent to your provider as well.   To learn more about what you can do with MyChart, go to [email or web address].[email or web address].    Your next appointment:   1 year(s)  Provider:   You will see one of the following Advanced Practice Providers on your designated Care Team:   Renee Ursuy, PA-C Michael "Andy" Tillery, PA-C 

## 2023-03-22 NOTE — Telephone Encounter (Signed)
Attempted to return patient call as requested by voice mail message and no answer.  

## 2023-03-22 NOTE — Progress Notes (Signed)
Electrophysiology Office Note   Date:  03/22/2023   ID:  Jeanne Bruce, DOB 02-27-1956, MRN [id number]  PCP:  Elijio Miles., MD  Cardiologist:  Shirlee Latch Primary Electrophysiologist:  Jeanne Pry Jorja Loa, MD    Chief Complaint: CHF   History of Present Illness: Jeanne Bruce is a 67 y.o. female who is being seen today for the evaluation of CHF at the request of Elijio Miles., MD. Presenting today for electrophysiology evaluation.  She has a history significant for CKD stage IV, chronic systolic heart failure due to nonischemic cardiomyopathy, multiple sclerosis, colon cancer post colectomy.  She had a right nephrectomy.  She was admitted to the hospital October 2020 with sepsis.  She had respiratory arrest and VF arrest in the emergency room and was intubated.  Echo showed an ejection fraction of 25% with apical ballooning.  Left heart catheterization showed no evidence of coronary disease.  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  Echo in 2020 showed a persistently low ejection fraction with apical akinesis.  She is post Medtronic CRT-D implanted 04/05/2020.  She had atrial fibrillation on her device and has been started on Eliquis.  Today, denies symptoms of palpitations, chest pain, shortness of breath, orthopnea, PND, lower extremity edema, claudication, dizziness, presyncope, syncope, bleeding, or neurologic sequela. The patient is tolerating medications without difficulties.     Past Medical History:  Diagnosis Date   Cancer [facility])    colon (resolved)   CHF (congestive heart failure) (HCC)    Hypertension    Multiple sclerosis (HCC)    Renal disorder    Past Surgical History:  Procedure Laterality Date   BIV ICD INSERTION CRT-D N/A 04/05/2020   Procedure: BIV ICD INSERTION CRT-D;  Surgeon: Regan Lemming, MD;  Location: [facility] INVASIVE CV LAB;  Service: Cardiovascular;  Laterality: N/A;   LEFT HEART CATH AND CORONARY ANGIOGRAPHY N/A 08/14/2019    Procedure: LEFT HEART CATH AND CORONARY ANGIOGRAPHY;  Surgeon: Kathleene Hazel, MD;  Location: [facility] INVASIVE CV LAB;  Service: Cardiovascular;  Laterality: N/A;   NEPHRECTOMY TRANSPLANTED ORGAN       Current Outpatient Medications  Medication Sig Dispense Refill   carvedilol (COREG) 6.25 MG tablet TAKE 1 TABLET(6.25 MG) BY MOUTH TWICE DAILY WITH A MEAL 180 tablet 3   cholecalciferol (VITAMIN D3) 25 MCG (1000 UT) tablet Take 1,000 Units by mouth in the morning and at bedtime.      Cyanocobalamin (VITAMIN B12 PO) Take 1 tablet by mouth daily.      ELIQUIS 5 MG TABS tablet TAKE 1 TABLET(5 MG) BY MOUTH TWICE DAILY 60 tablet 11   furosemide (LASIX) 20 MG tablet Take 2 tablets (40 mg total) by mouth every other day ALTERNATING with 1 tablet (20 mg total) every other day. 135 tablet 3   magnesium oxide (MAG-OX) 400 MG tablet Take 400 mg by mouth daily.     potassium chloride (KLOR-CON) 10 MEQ tablet Take 1 tablet (10 mEq total) by mouth on the days you take Furosemide 40 mg. 45 tablet 3   Probiotic Product (PROBIOTIC DAILY PO) Take 1 capsule by mouth daily.     No current facility-administered medications for this visit.    Allergies:   Ativan [lorazepam], Invanz [ertapenem], Mirabegron, Loratadine-pseudoephedrine er, and Penicillins   Social History:  The patient  reports that she has never smoked. She has never used smokeless tobacco. She reports that she does not currently use alcohol. She reports that she  does not use drugs.   Family History:  The patient's family history includes Atrial fibrillation in her father.   ROS:  Please see the history of present illness.   Otherwise, review of systems is positive for none.   All other systems are reviewed and negative.   PHYSICAL EXAM: VS:  BP 126/80   Pulse 66   Ht 5\' 1"  (1.549 m)   Wt 170 lb (77.1 kg)   SpO2 95%   BMI 32.12 kg/m  , BMI Body mass index is 32.12 kg/m. GEN: Well nourished, well developed, in no acute distress  HEENT:  normal  Neck: no JVD, carotid bruits, or masses Cardiac: RRR; no murmurs, rubs, or gallops,no edema  Respiratory:  clear to auscultation bilaterally, normal work of breathing GI: soft, nontender, nondistended, + BS MS: no deformity or atrophy  Skin: warm and dry, device site well healed Neuro:  Strength and sensation are intact Psych: euthymic mood, full affect  EKG:  EKG is ordered today. Personal review of the ekg ordered shows A sense, V pace  Personal review of the device interrogation today. Results in Paceart   Recent Labs: 02/19/2023: BUN 51; Creatinine, Ser 2.81; Potassium 4.1; Sodium 140    Lipid Panel     Component Value Date/Time   CHOL 160 08/11/2019 0830   TRIG 165 (H) 08/11/2019 0830   HDL 35 (L) 08/11/2019 0830   CHOLHDL 4.6 08/11/2019 0830   VLDL 33 08/11/2019 0830   LDLCALC 92 08/11/2019 0830     Wt Readings from Last 3 Encounters:  03/22/23 170 lb (77.1 kg)  11/07/22 170 lb (77.1 kg)  06/09/22 164 lb 12.8 oz (74.8 kg)      Other studies Reviewed: Additional studies/ records that were reviewed today include: TTE 01/20/20  Review of the above records today demonstrates:   1. Left ventricular ejection fraction, by estimation, is 25%. The left  ventricle has severely decreased function. The left ventricle demonstrates  global hypokinesis with septal-lateral dyssynchrony consistent with LBBB.  Left ventricular diastolic  parameters are consistent with Grade I diastolic dysfunction (impaired  relaxation).   2. Right ventricular systolic function is normal. The right ventricular  size is normal. Tricuspid regurgitation signal is inadequate for assessing  PA pressure.   3. Left atrial size was mildly dilated.   4. The mitral valve is normal in structure. Trivial mitral valve  regurgitation. No evidence of mitral stenosis.   5. The aortic valve is tricuspid. Aortic valve regurgitation is trivial.  No aortic stenosis is present.   6. The inferior vena cava  is normal in size with <50% respiratory  variability, suggesting right atrial pressure of 8 mmHg.    ASSESSMENT AND PLAN:  1.  Chronic systolic heart failure: Due to nonischemic cardiomyopathy.  Currently on opt medical therapy.  Is post Medtronic CRT-D implanted 04/05/2020.  Device functioning appropriately.  No changes.  2.  CKD stage IV: Status post nephrectomy October 2020.  Has follow-up with nephrology.  3.  Paroxysmal atrial fibrillation: Currently on carvedilol and Eliquis.  CHA2DS2-VASc of 4.  Rare episodes.  Continue with current management.  4.  Hypertension: Currently well-controlled  5.  Secondary hypercoagulable state: Currently on Eliquis for atrial fibrillation  Current medicines are reviewed at length with the patient today.   The patient does not have concerns regarding her medicines.  The following changes were made today: none  Labs/ tests ordered today include:  Orders Placed This Encounter  Procedures   EKG  12-Lead     Disposition:   FU 12 months  Signed, Syla Devoss Jorja Loa, MD  03/22/2023 12:31 PM     [facility] [address] [place] [address] [phone] (office) [phone] (fax)

## 2023-04-02 ENCOUNTER — Ambulatory Visit (INDEPENDENT_AMBULATORY_CARE_PROVIDER_SITE_OTHER): Payer: Medicare HMO

## 2023-04-02 DIAGNOSIS — I420 Dilated cardiomyopathy: Secondary | ICD-10-CM

## 2023-04-03 LAB — CUP PACEART REMOTE DEVICE CHECK
Battery Remaining Longevity: 82 mo
Battery Voltage: 3 V
Brady Statistic AP VP Percent: 0.02 %
Brady Statistic AP VS Percent: 0 %
Brady Statistic AS VP Percent: 98.41 %
Brady Statistic AS VS Percent: 1.57 %
Brady Statistic RA Percent Paced: 0.02 %
Brady Statistic RV Percent Paced: 4.33 %
Date Time Interrogation Session: 20240617012404
HighPow Impedance: 85 Ohm
Implantable Lead Connection Status: 753985
Implantable Lead Connection Status: 753985
Implantable Lead Connection Status: 753985
Implantable Lead Implant Date: 20210621
Implantable Lead Implant Date: 20210621
Implantable Lead Implant Date: 20210621
Implantable Lead Location: 753858
Implantable Lead Location: 753859
Implantable Lead Location: 753860
Implantable Lead Model: 4398
Implantable Lead Model: 5076
Implantable Pulse Generator Implant Date: 20210621
Lead Channel Impedance Value: 171 Ohm
Lead Channel Impedance Value: 171 Ohm
Lead Channel Impedance Value: 180 Ohm
Lead Channel Impedance Value: 180 Ohm
Lead Channel Impedance Value: 180 Ohm
Lead Channel Impedance Value: 323 Ohm
Lead Channel Impedance Value: 323 Ohm
Lead Channel Impedance Value: 342 Ohm
Lead Channel Impedance Value: 342 Ohm
Lead Channel Impedance Value: 342 Ohm
Lead Channel Impedance Value: 342 Ohm
Lead Channel Impedance Value: 342 Ohm
Lead Channel Impedance Value: 380 Ohm
Lead Channel Impedance Value: 570 Ohm
Lead Channel Impedance Value: 589 Ohm
Lead Channel Impedance Value: 589 Ohm
Lead Channel Impedance Value: 627 Ohm
Lead Channel Impedance Value: 627 Ohm
Lead Channel Pacing Threshold Amplitude: 0.375 V
Lead Channel Pacing Threshold Amplitude: 0.625 V
Lead Channel Pacing Threshold Pulse Width: 0.4 ms
Lead Channel Pacing Threshold Pulse Width: 0.4 ms
Lead Channel Sensing Intrinsic Amplitude: 11 mV
Lead Channel Sensing Intrinsic Amplitude: 12.625 mV
Lead Channel Sensing Intrinsic Amplitude: 2.75 mV
Lead Channel Sensing Intrinsic Amplitude: 2.75 mV
Lead Channel Setting Pacing Amplitude: 1.5 V
Lead Channel Setting Pacing Amplitude: 2 V
Lead Channel Setting Pacing Amplitude: 2.5 V
Lead Channel Setting Pacing Pulse Width: 0.4 ms
Lead Channel Setting Pacing Pulse Width: 0.4 ms
Lead Channel Setting Sensing Sensitivity: 0.3 mV
Zone Setting Status: 755011
Zone Setting Status: 755011

## 2023-04-16 ENCOUNTER — Ambulatory Visit: Payer: Medicare HMO | Attending: Cardiology

## 2023-04-16 DIAGNOSIS — Z95810 Presence of automatic (implantable) cardiac defibrillator: Secondary | ICD-10-CM

## 2023-04-16 DIAGNOSIS — I5022 Chronic systolic (congestive) heart failure: Secondary | ICD-10-CM | POA: Diagnosis not present

## 2023-04-18 NOTE — Progress Notes (Signed)
EPIC Encounter for ICM Monitoring  Patient Name: Jeanne Bruce is a 67 y.o. female Date: 04/18/2023 Primary Care Physican: Elijio Miles., MD Primary Cardiologist: Shirlee Latch Electrophysiologist: Elberta Fortis Bi-V Pacing: 98.3%        1/23//2024 Office Weight: 170 lbs 02/13/2023 Weight: 165 lbs 04/18/2023 Weight: 165 lbs   Time in AT/AF  <0.1 hr/day (<0.1%)                                                            Spoke with patient and heart failure questions reviewed.  Transmission results reviewed.  Pt did not feel well during decreased impedance but was on [place] vacation during that time which was very hot.     Optivol thoracic impedance suggesting possible fluid accumulation starting 6/23 and returning close to baseline normal.    Prescribed:  Furosemide 20 mg take 1 tablet by mouth every other day alternating with 40 mg every other day Potassium 10 mEq take 1 tablet by mouth on the days that Furosemide 40 mg is taken.   Labs:  02/19/2023 Creatinine 2.81, BUN 51, Potassium 4.1, Sodium 140, GFR 18 12/05/2022 Creatinine 2.50, BUN 40, Potassium 3.7, Sodium 139, GFR 20 10/19/2022 Creatinine 2.7,   BUN 46, Potassium 3.5, Sodium 140, GFR 19 06/01/2022 Creatinine 2.50, BUN 37, Potassium 3.5, Sodium 140, GFR 20 A complete set of results can be found in Results Review.   Recommendations:  No changes and encouraged to call if experiencing any fluid symptoms.   Follow-up plan: ICM clinic phone appointment on 05/21/2023.   91 day device clinic remote transmission 07/02/2023.     EP/Cardiology Office Visits:  Recall 03/08/2023 with Dr Shirlee Latch (4 month).   Recall 03/17/2024 with Dr. Elberta Fortis.       Copy of ICM check sent to Dr. Elberta Fortis.  3 month ICM trend: 04/16/2023.    12-14 Month ICM trend:     Karie Soda, RN 04/18/2023 12:45 PM

## 2023-04-20 ENCOUNTER — Other Ambulatory Visit: Payer: Self-pay | Admitting: Cardiology

## 2023-04-23 NOTE — Progress Notes (Signed)
Remote ICD transmission.   

## 2023-05-08 ENCOUNTER — Ambulatory Visit (HOSPITAL_COMMUNITY)
Admission: RE | Admit: 2023-05-08 | Discharge: 2023-05-08 | Disposition: A | Discharge status: Home / Self Care | Payer: Medicare HMO | Source: Ambulatory Visit | Attending: Family Medicine | Admitting: Family Medicine

## 2023-05-08 ENCOUNTER — Encounter (HOSPITAL_COMMUNITY): Payer: Self-pay

## 2023-05-08 VITALS — BP 142/72 | HR 67 | Wt 168.2 lb

## 2023-05-08 DIAGNOSIS — I447 Left bundle-branch block, unspecified: Secondary | ICD-10-CM | POA: Diagnosis not present

## 2023-05-08 DIAGNOSIS — E875 Hyperkalemia: Secondary | ICD-10-CM | POA: Insufficient documentation

## 2023-05-08 DIAGNOSIS — I5022 Chronic systolic (congestive) heart failure: Secondary | ICD-10-CM | POA: Diagnosis not present

## 2023-05-08 DIAGNOSIS — N184 Chronic kidney disease, stage 4 (severe): Secondary | ICD-10-CM | POA: Insufficient documentation

## 2023-05-08 DIAGNOSIS — R092 Respiratory arrest: Secondary | ICD-10-CM | POA: Diagnosis not present

## 2023-05-08 DIAGNOSIS — Z933 Colostomy status: Secondary | ICD-10-CM | POA: Insufficient documentation

## 2023-05-08 DIAGNOSIS — I428 Other cardiomyopathies: Secondary | ICD-10-CM | POA: Insufficient documentation

## 2023-05-08 DIAGNOSIS — Z905 Acquired absence of kidney: Secondary | ICD-10-CM | POA: Insufficient documentation

## 2023-05-08 DIAGNOSIS — Z79899 Other long term (current) drug therapy: Secondary | ICD-10-CM | POA: Insufficient documentation

## 2023-05-08 DIAGNOSIS — Z87440 Personal history of urinary (tract) infections: Secondary | ICD-10-CM | POA: Insufficient documentation

## 2023-05-08 DIAGNOSIS — I130 Hypertensive heart and chronic kidney disease with heart failure and stage 1 through stage 4 chronic kidney disease, or unspecified chronic kidney disease: Secondary | ICD-10-CM | POA: Diagnosis not present

## 2023-05-08 DIAGNOSIS — G35 Multiple sclerosis: Secondary | ICD-10-CM | POA: Insufficient documentation

## 2023-05-08 DIAGNOSIS — I480 Paroxysmal atrial fibrillation: Secondary | ICD-10-CM | POA: Diagnosis not present

## 2023-05-08 DIAGNOSIS — Z7901 Long term (current) use of anticoagulants: Secondary | ICD-10-CM | POA: Insufficient documentation

## 2023-05-08 DIAGNOSIS — Z85038 Personal history of other malignant neoplasm of large intestine: Secondary | ICD-10-CM | POA: Diagnosis not present

## 2023-05-08 NOTE — Patient Instructions (Addendum)
Thank you for coming in today  If you had labs drawn today, any labs that are abnormal the clinic will call you No news is good news  Medications: No changes   Follow up appointments:  Your physician recommends that you schedule a follow-up appointment in:  4 months With Dr. Shirlee Latch with echocardiogram You will receive a reminder letter in the mail a few months in advance. If you don't receive a letter, please call our office to schedule the follow-up appointment.   Your physician has requested that you have an echocardiogram. Echocardiography is a painless test that uses sound waves to create images of your heart. It provides your doctor with information about the size and shape of your heart and how well your heart's chambers and valves are working. This procedure takes approximately one hour. There are no restrictions for this procedure.      Do the following things EVERYDAY: Weigh yourself in the morning before breakfast. Write it down and keep it in a log. Take your medicines as prescribed Eat low salt foods--Limit salt (sodium) to 2000 mg per day.  Stay as active as you can everyday Limit all fluids for the day to less than 2 liters   At the [facility], you and your health needs are our priority. As part of our continuing mission to provide you with exceptional heart care, we have created designated Provider Care Teams. These Care Teams include your primary Cardiologist (physician) and Advanced Practice Providers (APPs- Physician Assistants and Nurse Practitioners) who all work together to provide you with the care you need, when you need it.   You may see any of the following providers on your designated Care Team at your next follow up: Dr Arvilla Meres Dr Marca Ancona Dr. Marcos Eke, NP Robbie Lis, [place] [facility] [place], [place] Brynda Peon, NP Karle Plumber, PharmD   Please be sure to bring in all your medications  bottles to every appointment.    Thank you for choosing [facility]  If you have any questions or concerns before your next appointment please send [place] a message through [place] or call our office at [phone].    TO LEAVE A MESSAGE FOR THE NURSE SELECT OPTION 2, PLEASE LEAVE A MESSAGE INCLUDING: YOUR NAME DATE OF BIRTH CALL BACK NUMBER REASON FOR CALL**this is important as we prioritize the call backs  YOU WILL RECEIVE A CALL BACK THE SAME DAY AS LONG AS YOU CALL BEFORE 4:00 PM

## 2023-05-08 NOTE — Progress Notes (Signed)
[facility] Note   Date:  05/08/2023  PCP:  Elijio Miles., MD  Cardiologist:  Armanda Magic, MD HF Cardioligist: Dr. Shirlee Latch  HPI: Jeanne Bruce is a 67 y.o. female with history of CKD stage 4, chronic systolic CHF, multiple sclerosis, and right nephrectomy.  She was referred by Dr. Mayford Knife for CHF evaluation.  Patient also had remote colon cancer with colostomy.  She has had frequent UTIs and was admitted in 10/20 with urosepsis.  She had respiratory arrest and VF arrest in the ER and was intubated.  Echo was done, showing EF 25% with apical ballooning.  LHC showed no significant coronary disease.  She ended up having right nephrectomy for renal abscess in 10/20.    Echo was repeated in 12/20, showing EF still low at 25-30% with peri-apical akinesis.   She has had a hard time taking cardiac medications due to orthostatic symptoms with low BP.  She was sent home from the hospital in 10/20 on hydralazine/Imdur but had to stop due to low BP.    Echo in 4/21 showed EF 25% with septal-lateral dyssynchrony, normal RV.  Medtronic CRT-D device was placed in 6/21.  Repeat echo in 1/22 showed EF up to 50-55% with normal RV.   Seen for virtual visit 04/22 d/t COVID-19 pandemic. Had been doing well. No med changes. Noted drop in BiV pacing percentage with episodes of atrial fibrillation. F/u recommended in 3 months.  Furosemide 20 mg daily started on 01/30/22 due to elevated OptiVol fluid index.   Echo 8/23, EF 50-55%, normal RV, normal IVC.    Today she returns for HF follow up. Overall feeling fine. She has been dehydrated for a couple of weeks and has been trying to drink more fluids. She has dyspnea walking up steps, this is her baseline. She is struggling with her balance due to her MS and is planning on starting PT soon, no falls. Denies palpitations, abnormal bleeding, CP, dizziness, edema, or PND/Orthopnea. Appetite ok. No fever or chills. Weight at home 166 pounds. Taking  all medications. Follows with Nephrology at [facility], turned down for renal transplant.  ECG (personally reviewed from 03/22/23): v-paced, 66 bpm  MDT device interrogation: 98.3% BiV paced, stable thoracic impedance, no recent AF/VT, 1.4 hr/day activity (personally reviewed).  Labs (11/20): creatinine 1.5 Labs (12/20): creatinine 2.47 Labs (1/21): creatinine 2.1 => 2.04, BNP 54 Labs (6/21): creatinine 2.33 Labs (7/21): K 5.7, creatinine 2.61 Labs (9/21): K 4.5, creatinine 2.38 Labs (4/22): K 5.5, creatinine 2.5, LDL 91 Labs (8/23): K 3.5, creatinine 2.5, hgb 11.6, LDL 97 Labs (1/24): K 3.5, creatinine 2.7 Labs (7/24): K 3.6, creatinine 3.3  PMH: 1. H/o right nephrectomy for renal abscess in 10/20.  2. Multiple Sclerosis 3. HTN 4. H/o colon cancer: s/p colectomy with colostomy.  5. CKD: Stage 4. Sees a nephrologist at [facility].  6. Frequent UTIs.  7. Chronic systolic CHF: Nonischemic cardiomyopathy. Medtronic CRT-D device.  - Echo (10/20): EF 25%, apical ballooning.  - LHC (10/20): Normal coronaries.  - Echo (12/20): EF 25-30%, peri-apical akinesis, normal RV size and systolic function (similar to 78/29 echo).   - Echo (4/21): EF 25%, septal-lateral dyssynchrony - Echo (1/22): EF 50-55%, normal RV.  - Echo (8/23): EF 50-55%, normal RV, normal IVC.  8. LBBB 9. Atrial fibrillation: Paroxysmal.   Social History   Socioeconomic History   Marital status: Married    Spouse name: Not on file   Number of children: Not on file  Years of education: Not on file   Highest education level: Not on file  Occupational History   Not on file  Tobacco Use   Smoking status: Never   Smokeless tobacco: Never  Substance and Sexual Activity   Alcohol use: Not Currently   Drug use: Never   Sexual activity: Not on file  Other Topics Concern   Not on file  Social History Narrative   Not on file   Social Determinants of Health   Financial Resource Strain: Low Risk  (05/18/2022)   [facility]   Overall Financial Resource Strain (CARDIA)    Difficulty of Paying Living Expenses: Not hard at all  Food Insecurity: No Food Insecurity (12/04/2022)   [facility]   Hunger Vital Sign    Worried About Running Out of Food in the Last Year: Never true    Ran Out of Food in the Last Year: Never true  Transportation Needs: No Transportation Needs (12/04/2022)   [facility]   PRAPARE - Transportation    Lack of Transportation (Medical): No    Lack of Transportation (Non-Medical): No  Physical Activity: Sufficiently Active (05/18/2022)   [facility]   Exercise Vital Sign    Days of Exercise per Week: 5 days    Minutes of Exercise per Session: 30 min  Stress: No Stress Concern Present (05/18/2022)   [facility]16[facility]   [employer] of Occupational Health - Occupational Stress Questionnaire    Feeling of Stress : Not at all  Social Connections: Socially Integrated (05/18/2022)   [facility]   Social Connection and Isolation Panel [NHANES]    Frequency of Communication with Friends and Family: More than three times a week    Frequency of Social Gatherings with Friends and Family: More than three times a week    Attends Religious Services: More than 4 times per year    Active Member of [employer] or Organizations: Yes    Attends Banker Meetings: More than 4 times per year    Marital Status: Married  Catering manager Violence: Not At Risk (05/18/2022)    [facility] visits prior to 12/16/2022.   Humiliation, Afraid, Rape, and Kick questionnaire    Fear of Current or Ex-Partner: No    Emotionally Abused: No    Physically Abused: No    Sexually Abused: No   Family History  Problem Relation Age of Onset   Atrial fibrillation Father    Diabetes Mellitus II Neg Hx    ROS: All systems reviewed and negative except as per HPI.   Current Outpatient Medications  Medication Sig Dispense Refill   carvedilol (COREG) 6.25 MG tablet TAKE 1 TABLET(6.25 MG) BY MOUTH TWICE DAILY WITH A MEAL 180 tablet 3   cholecalciferol (VITAMIN D3) 25 MCG (1000 UT) tablet Take 1,000 Units by mouth in the morning and at bedtime.      Cyanocobalamin (VITAMIN B12 PO) Take 1 tablet by mouth daily.      ELIQUIS 5 MG TABS tablet TAKE  1 TABLET(5 MG) BY MOUTH TWICE DAILY 60 tablet 11   furosemide (LASIX) 20 MG tablet TAKE 1 TABLET(20 MG) BY MOUTH DAILY 90 tablet 3   magnesium oxide (MAG-OX) 400 MG tablet Take 400 mg by mouth daily.     potassium chloride (KLOR-CON) 10 MEQ tablet Take 1 tablet (10 mEq total) by mouth on the days you take Furosemide 40 mg. 45 tablet 3   Probiotic Product (PROBIOTIC DAILY PO) Take 1 capsule by mouth daily.     No current facility-administered medications for this encounter.   Wt Readings from Last 3 Encounters:  05/08/23 76.3 kg (168 lb 3.2 oz)  03/22/23 77.1 kg (170 lb)  11/07/22 77.1 kg (170 lb)   BP (!) 142/72   Pulse 67   Wt 76.3 kg (168 lb 3.2 oz)   SpO2 99%   BMI 31.78 kg/m   Physical Exam:   General:  NAD. No resp difficulty, walked into clinic HEENT: Normal Neck: Supple. No JVD. Carotids 2+ bilat; no bruits. No lymphadenopathy or thryomegaly appreciated. Cor: PMI nondisplaced. Regular rate & rhythm. No rubs, gallops or murmurs. Lungs: Clear Abdomen: Soft, nontender, nondistended. No hepatosplenomegaly. No bruits or masses. Good bowel sounds. Extremities: No cyanosis, clubbing, rash,  edema Neuro: Alert & oriented x 3, cranial nerves grossly intact. Moves all 4 extremities w/o difficulty. Affect pleasant.  Assessment/Plan:  1. Chronic systolic CHF: Nonischemic cardiomyopathy, LHC in 10/20 with no significant coronary disease.  Echo in 10/20 was suggestive of stress (Takotsubo-type) cardiomyopathy with apical ballooning (from severe urosepsis).  However, LV function did not improve on repeat echo in 12/20 or echo in 4/21 (EF still 25%).  ?LBBB cardiomyopathy.  She is now s/p Medtronic CRT-D device. Repeat echo in 1/22 showed EF up to 50-55%, good response to CRT.  Echo 8/23 showed EF 50-55%.  NYHA class II symptoms, not volume overloaded by exam or Optivol.  Medical therapy limited by CKD IV. - Continue Lasix 20 mg daily. Labs 05/02/23 reviewed, K 3.6, SCr 3.3, eGFR 15 - Continue Coreg 6.25 mg bid.  - No ACEI/ARNI/ARB with CKD stage 4.  - Off spironolactone due to hyperkalemia.   - Off Jardiance with GFR < 20, also with recent UTI. - Update echo next visit 2. CKD stage 4: S/p right nephrectomy in 10/20.  Sees nephrology at [facility]. Most recent creatinine 3.3. - She was turned down for renal transplant. - Nephrology to discuss potential dialysis candidacy next visit if renal function continues to decline. 3. Atrial fibrillation: Paroxysmal.  Noted after device implantation.  No recent episodes.   - Continue apixaban 5 mg bid. No bleeding issues. Hgb 11.6 (05/02/23). - No recent AF, if becomes more frequent and impedes BiV pacing, may need ablation.   Follow up in 4 months with Dr. Shirlee Latch + echo  Jacklynn Ganong, [place]  05/08/2023  [facility] [address] [facility] [place] [address] [phone] (office) [phone] (fax)

## 2023-05-21 ENCOUNTER — Ambulatory Visit: Payer: Medicare HMO

## 2023-05-21 DIAGNOSIS — Z95810 Presence of automatic (implantable) cardiac defibrillator: Secondary | ICD-10-CM

## 2023-05-21 DIAGNOSIS — I5022 Chronic systolic (congestive) heart failure: Secondary | ICD-10-CM

## 2023-05-22 NOTE — Progress Notes (Signed)
EPIC Encounter for ICM Monitoring  Patient Name: Jeanne Bruce is a 67 y.o. female Date: 05/22/2023 Primary Care Physican: Elijio Miles., MD Primary Cardiologist: Shirlee Latch Electrophysiologist: Elberta Fortis Bi-V Pacing: 98.3%        1/23//2024 Office Weight: 170 lbs 02/13/2023 Weight: 165 lbs 04/18/2023 Weight: 165 lbs   Time in AT/AF  <0.1 hr/day (<0.1%)                                                            Spoke with patient and heart failure questions reviewed.  Transmission results reviewed.  Pt asymptomatic for fluid accumulation.  Reports feeling well at this time and voices no complaints.   Nothing has changed in diet and fluid intake is < 64 oz daily.    Optivol thoracic impedance suggesting possible fluid accumulation starting 7/23.    Prescribed:  Furosemide 20 mg take 1 tablet by mouth every other day alternating with 40 mg every other day Potassium 10 mEq take 1 tablet by mouth on the days that Furosemide 40 mg is taken.   Labs:  05/02/2023 Creatinine 3.3,   BUN 82, Potassium 3.6, Sodium 136, GFR 15 04/26/2023 Creatinine 3.39, BUN 56, Potassium 4.0, Sodium 138, GFR 14 04/24/2023 Creatinine 2.84, BUN 52, Potassium 3.5, Sodium 135, GFR 12 02/19/2023 Creatinine 2.81, BUN 51, Potassium 4.1, Sodium 140, GFR 18 12/05/2022 Creatinine 2.50, BUN 40, Potassium 3.7, Sodium 139, GFR 20 10/19/2022 Creatinine 2.7,   BUN 46, Potassium 3.5, Sodium 140, GFR 19 06/01/2022 Creatinine 2.50, BUN 37, Potassium 3.5, Sodium 140, GFR 20 A complete set of results can be found in Results Review.   Recommendations:  Recommendation to limit salt intake to 2000 mg daily and fluid intake to 64 oz daily.  Encouraged to call if experiencing any fluid symptoms.    Follow-up plan: ICM clinic phone appointment on 05/29/2023 to recheck fluid levels.   91 day device clinic remote transmission 07/02/2023.     EP/Cardiology Office Visits:  Recall 09/05/2023 with Dr Shirlee Latch.   Recall 03/17/2024 with Dr. Elberta Fortis.        Copy of ICM check sent to Dr. Elberta Fortis.  3 month ICM trend: 05/21/2023.    12-14 Month ICM trend:     Karie Soda, RN 05/22/2023 8:19 AM

## 2023-05-25 ENCOUNTER — Encounter: Payer: Self-pay | Admitting: Cardiology

## 2023-05-29 ENCOUNTER — Ambulatory Visit: Payer: Medicare HMO | Attending: Cardiology

## 2023-05-29 DIAGNOSIS — I5022 Chronic systolic (congestive) heart failure: Secondary | ICD-10-CM

## 2023-05-29 DIAGNOSIS — Z95810 Presence of automatic (implantable) cardiac defibrillator: Secondary | ICD-10-CM

## 2023-05-30 ENCOUNTER — Telehealth: Payer: Self-pay

## 2023-05-30 NOTE — Telephone Encounter (Signed)
 Remote ICM transmission received.  Attempted call to patient regarding ICM remote transmission and left detailed message per DPR.  Left ICM phone number and advised to return call for any fluid symptoms or questions. Next ICM remote transmission scheduled 06/25/2023.

## 2023-05-30 NOTE — Progress Notes (Signed)
EPIC Encounter for ICM Monitoring  Patient Name: Jeanne Bruce is a 67 y.o. female Date: 05/30/2023 Primary Care Physican: Elijio Miles., MD Primary Cardiologist: Shirlee Latch Electrophysiologist: Elberta Fortis Bi-V Pacing: 98.3%        1/23//2024 Office Weight: 170 lbs 02/13/2023 Weight: 165 lbs 04/18/2023 Weight: 165 lbs   Time in AT/AF  0.0 hr/day (0.0%))                                                            Attempted call to patient and unable to reach.  Left detailed message per DPR regarding transmission. Transmission reviewed.    Optivol thoracic impedance suggesting fluid levels returned to normal.    Prescribed:  Furosemide 20 mg take 1 tablet by mouth every other day alternating with 40 mg every other day Potassium 10 mEq take 1 tablet by mouth on the days that Furosemide 40 mg is taken.   Labs:  05/02/2023 Creatinine 3.3,   BUN 82, Potassium 3.6, Sodium 136, GFR 15 04/26/2023 Creatinine 3.39, BUN 56, Potassium 4.0, Sodium 138, GFR 14 04/24/2023 Creatinine 2.84, BUN 52, Potassium 3.5, Sodium 135, GFR 12 02/19/2023 Creatinine 2.81, BUN 51, Potassium 4.1, Sodium 140, GFR 18 12/05/2022 Creatinine 2.50, BUN 40, Potassium 3.7, Sodium 139, GFR 20 10/19/2022 Creatinine 2.7,   BUN 46, Potassium 3.5, Sodium 140, GFR 19 06/01/2022 Creatinine 2.50, BUN 37, Potassium 3.5, Sodium 140, GFR 20 A complete set of results can be found in Results Review.   Recommendations: Left voice mail with ICM number and encouraged to call if experiencing any fluid symptoms.    Follow-up plan: ICM clinic phone appointment on 06/25/2023.   91 day device clinic remote transmission 07/02/2023.     EP/Cardiology Office Visits:  Recall 09/05/2023 with Dr Shirlee Latch.   Recall 03/17/2024 with Dr. Elberta Fortis.       Copy of ICM check sent to Dr. Elberta Fortis.  3 month ICM trend: 05/29/2023.    12-14 Month ICM trend:     Karie Soda, RN 05/30/2023 10:20 AM

## 2023-06-19 ENCOUNTER — Telehealth: Payer: Self-pay | Admitting: Cardiology

## 2023-06-19 MED ORDER — FUROSEMIDE 20 MG PO TABS: ORAL_TABLET | ORAL | 3 refills | Status: DC

## 2023-06-19 NOTE — Telephone Encounter (Signed)
UPDATED SCRIPT SENT

## 2023-06-19 NOTE — Telephone Encounter (Signed)
Pt c/o medication issue:  1. Name of Medication: furosemide (LASIX) 20 MG tablet   2. How are you currently taking this medication (dosage and times per day)? 2 x daily every other day after taking potassium   3. Are you having a reaction (difficulty breathing--STAT)?   4. What is your medication issue? Patient states that the script was written for 1x daily and now she is unable to refill this medication. Requesting call back to discuss.

## 2023-06-22 ENCOUNTER — Other Ambulatory Visit (HOSPITAL_COMMUNITY): Payer: Self-pay

## 2023-06-22 MED ORDER — APIXABAN 5 MG PO TABS: 5.0000 mg | ORAL_TABLET | Freq: Two times a day (BID) | ORAL | 11 refills | Status: DC

## 2023-06-25 ENCOUNTER — Ambulatory Visit: Payer: Medicare HMO | Attending: Cardiology

## 2023-06-25 DIAGNOSIS — Z95810 Presence of automatic (implantable) cardiac defibrillator: Secondary | ICD-10-CM | POA: Diagnosis not present

## 2023-06-25 DIAGNOSIS — I5022 Chronic systolic (congestive) heart failure: Secondary | ICD-10-CM | POA: Diagnosis not present

## 2023-06-29 ENCOUNTER — Telehealth: Payer: Self-pay

## 2023-06-29 NOTE — Telephone Encounter (Signed)
Remote ICM transmission received.  Attempted call to patient regarding ICM remote transmission and no answer.

## 2023-06-29 NOTE — Progress Notes (Signed)
EPIC Encounter for ICM Monitoring  Patient Name: Jeanne Bruce is a 67 y.o. female Date: 06/29/2023 Primary Care Physican: Elijio Miles., MD Primary Cardiologist: Shirlee Latch Electrophysiologist: Elberta Fortis Bi-V Pacing: 98.4%        1/23//2024 Office Weight: 170 lbs 02/13/2023 Weight: 165 lbs 04/18/2023 Weight: 165 lbs   Time in AT/AF  0.0 hr/day (0.0%))                                                            Attempted call to patient and unable to reach.  Transmission reviewed.    Optivol thoracic impedance suggesting possible fluid accumulation starting 8/21 and returning to baseline 9/7.    Prescribed:  Furosemide 20 mg take 1 tablet by mouth every other day alternating with 40 mg every other day Potassium 10 mEq take 1 tablet by mouth on the days that Furosemide 40 mg is taken.   Labs:  05/02/2023 Creatinine 3.3,   BUN 82, Potassium 3.6, Sodium 136, GFR 15 04/26/2023 Creatinine 3.39, BUN 56, Potassium 4.0, Sodium 138, GFR 14 04/24/2023 Creatinine 2.84, BUN 52, Potassium 3.5, Sodium 135, GFR 12 02/19/2023 Creatinine 2.81, BUN 51, Potassium 4.1, Sodium 140, GFR 18 12/05/2022 Creatinine 2.50, BUN 40, Potassium 3.7, Sodium 139, GFR 20 10/19/2022 Creatinine 2.7,   BUN 46, Potassium 3.5, Sodium 140, GFR 19 06/01/2022 Creatinine 2.50, BUN 37, Potassium 3.5, Sodium 140, GFR 20 A complete set of results can be found in Results Review.   Recommendations: Unable to reach.     Follow-up plan: ICM clinic phone appointment on 07/30/2023.   91 day device clinic remote transmission 10/01/2023.     EP/Cardiology Office Visits:  Recall 09/05/2023 with Dr Shirlee Latch.   Recall 03/17/2024 with Dr. Elberta Fortis.       Copy of ICM check sent to Dr. Elberta Fortis.  3 month ICM trend: 06/25/2023.    12-14 Month ICM trend:     Karie Soda, RN 06/29/2023 2:52 PM

## 2023-07-02 ENCOUNTER — Ambulatory Visit (INDEPENDENT_AMBULATORY_CARE_PROVIDER_SITE_OTHER): Payer: Medicare HMO

## 2023-07-02 DIAGNOSIS — I5022 Chronic systolic (congestive) heart failure: Secondary | ICD-10-CM

## 2023-07-02 DIAGNOSIS — I420 Dilated cardiomyopathy: Secondary | ICD-10-CM | POA: Diagnosis not present

## 2023-07-03 LAB — CUP PACEART REMOTE DEVICE CHECK
Battery Remaining Longevity: 80 mo
Battery Voltage: 2.99 V
Brady Statistic AP VP Percent: 0.01 %
Brady Statistic AP VS Percent: 0 %
Brady Statistic AS VP Percent: 98.36 %
Brady Statistic AS VS Percent: 1.64 %
Brady Statistic RA Percent Paced: 0.01 %
Brady Statistic RV Percent Paced: 6.01 %
Date Time Interrogation Session: 20240916033523
HighPow Impedance: 89 Ohm
Implantable Lead Connection Status: 753985
Implantable Lead Connection Status: 753985
Implantable Lead Connection Status: 753985
Implantable Lead Implant Date: 20210621
Implantable Lead Implant Date: 20210621
Implantable Lead Implant Date: 20210621
Implantable Lead Location: 753858
Implantable Lead Location: 753859
Implantable Lead Location: 753860
Implantable Lead Model: 4398
Implantable Lead Model: 5076
Implantable Pulse Generator Implant Date: 20210621
Lead Channel Impedance Value: 161.5 Ohm
Lead Channel Impedance Value: 161.5 Ohm
Lead Channel Impedance Value: 166.114
Lead Channel Impedance Value: 166.114
Lead Channel Impedance Value: 166.114
Lead Channel Impedance Value: 304 Ohm
Lead Channel Impedance Value: 323 Ohm
Lead Channel Impedance Value: 323 Ohm
Lead Channel Impedance Value: 323 Ohm
Lead Channel Impedance Value: 342 Ohm
Lead Channel Impedance Value: 342 Ohm
Lead Channel Impedance Value: 380 Ohm
Lead Channel Impedance Value: 380 Ohm
Lead Channel Impedance Value: 570 Ohm
Lead Channel Impedance Value: 570 Ohm
Lead Channel Impedance Value: 570 Ohm
Lead Channel Impedance Value: 570 Ohm
Lead Channel Impedance Value: 589 Ohm
Lead Channel Pacing Threshold Amplitude: 0.5 V
Lead Channel Pacing Threshold Amplitude: 0.625 V
Lead Channel Pacing Threshold Pulse Width: 0.4 ms
Lead Channel Pacing Threshold Pulse Width: 0.4 ms
Lead Channel Sensing Intrinsic Amplitude: 12.875 mV
Lead Channel Sensing Intrinsic Amplitude: 12.875 mV
Lead Channel Sensing Intrinsic Amplitude: 2.625 mV
Lead Channel Sensing Intrinsic Amplitude: 2.625 mV
Lead Channel Setting Pacing Amplitude: 1.5 V
Lead Channel Setting Pacing Amplitude: 2 V
Lead Channel Setting Pacing Amplitude: 2.5 V
Lead Channel Setting Pacing Pulse Width: 0.4 ms
Lead Channel Setting Pacing Pulse Width: 0.4 ms
Lead Channel Setting Sensing Sensitivity: 0.3 mV
Zone Setting Status: 755011
Zone Setting Status: 755011

## 2023-07-05 ENCOUNTER — Other Ambulatory Visit (HOSPITAL_COMMUNITY): Payer: Self-pay | Admitting: Cardiology

## 2023-07-16 NOTE — Progress Notes (Signed)
Remote ICD transmission.   

## 2023-07-26 ENCOUNTER — Other Ambulatory Visit: Payer: Self-pay | Admitting: Family Medicine

## 2023-07-30 ENCOUNTER — Ambulatory Visit: Payer: Medicare HMO | Attending: Cardiology

## 2023-07-30 DIAGNOSIS — I5022 Chronic systolic (congestive) heart failure: Secondary | ICD-10-CM | POA: Diagnosis not present

## 2023-07-30 DIAGNOSIS — Z95810 Presence of automatic (implantable) cardiac defibrillator: Secondary | ICD-10-CM | POA: Diagnosis not present

## 2023-08-03 NOTE — Progress Notes (Signed)
EPIC Encounter for ICM Monitoring  Patient Name: Jeanne Bruce is a 67 y.o. female Date: 08/03/2023 Primary Care Physican: Elijio Miles., MD Primary Cardiologist: Shirlee Latch Electrophysiologist: Elberta Fortis Bi-V Pacing: 97.9%        1/23//2024 Office Weight: 170 lbs 02/13/2023 Weight: 165 lbs 04/18/2023 Weight: 165 lbs   Time in AT/AF  <0.1 hr/day (<0.1%)                                                           Spoke with patient and heart failure questions reviewed.  Transmission results reviewed.  Pt asymptomatic for fluid accumulation.  Reports feeling well at this time and voices no complaints.  She increased fluid intake in early September because she felt dehydrated which correlates with impedance suggesting possible dryness 9/7-9/15 which may have resulted with fluid retention starting 9/16.     Optivol thoracic impedance suggesting possible fluid accumulation starting 9/16 and returning to baseline 10/13.    Prescribed:  Furosemide 20 mg take 1 tablet by mouth every other day alternating with 40 mg every other day.  Confirmed 10/18 taking Lasix as prescribed.  Potassium 10 mEq take 1 tablet by mouth on the days that Furosemide 40 mg is taken.   Labs: 06/07/2023 Creatinine 2.86, BUN 69, Potassium 4.0, Sodium 137, GFR 17  05/21/2023 Creatinine 2.36, BUN 57, Potassium 4.2, Sodium 140, GFR 22  05/02/2023 Creatinine 3.3,   BUN 82, Potassium 3.6, Sodium 136, GFR 15 04/26/2023 Creatinine 3.39, BUN 56, Potassium 4.0, Sodium 138, GFR 14 04/24/2023 Creatinine 2.84, BUN 52, Potassium 3.5, Sodium 135, GFR 12 02/19/2023 Creatinine 2.81, BUN 51, Potassium 4.1, Sodium 140, GFR 18 12/05/2022 Creatinine 2.50, BUN 40, Potassium 3.7, Sodium 139, GFR 20 10/19/2022 Creatinine 2.7,   BUN 46, Potassium 3.5, Sodium 140, GFR 19 06/01/2022 Creatinine 2.50, BUN 37, Potassium 3.5, Sodium 140, GFR 20 A complete set of results can be found in Results Review.   Recommendations:  Encouraged to limit fluid intake to 64  oz daily to maintain fluid level balance.   Follow-up plan: ICM clinic phone appointment on 09/03/2023.   91 day device clinic remote transmission 10/01/2023.     EP/Cardiology Office Visits:  Encouraged to call Dr Alford Highland office to schedule November appt.   Recall 09/05/2023 with Dr Shirlee Latch.   Recall 03/17/2024 with Dr. Elberta Fortis.       Copy of ICM check sent to Dr. Elberta Fortis.  3 month ICM trend: 07/30/2023.    12-14 Month ICM trend:     Karie Soda, RN 08/03/2023 12:53 PM

## 2023-09-03 ENCOUNTER — Ambulatory Visit: Payer: Medicare HMO | Attending: Cardiology

## 2023-09-03 DIAGNOSIS — I5022 Chronic systolic (congestive) heart failure: Secondary | ICD-10-CM | POA: Diagnosis not present

## 2023-09-03 DIAGNOSIS — Z95810 Presence of automatic (implantable) cardiac defibrillator: Secondary | ICD-10-CM | POA: Diagnosis not present

## 2023-09-04 NOTE — Progress Notes (Incomplete)
ADVANCED HF CLINIC NOTE    Date:  09/04/2023  PCP:  Elijio Miles., MD  Cardiologist:  Armanda Magic, MD HF Cardioligist: Dr. Shirlee Latch  HPI: Jeanne Bruce is a 67 y.o. female with history of CKD stage 4, chronic systolic CHF, multiple sclerosis, and right nephrectomy.  She was referred by Dr. Mayford Knife for CHF evaluation.  Patient also had remote colon cancer with colostomy.  She has had frequent UTIs and was admitted in 10/20 with urosepsis.  She had respiratory arrest and VF arrest in the ER and was intubated.  Echo showed EF 25% with apical ballooning.  LHC showed no significant coronary disease.  She ended up having right nephrectomy for renal abscess in 10/20.    Echo 12/20: EF still low at 25-30% with peri-apical akinesis.   She has had a hard time taking cardiac medications due to orthostatic symptoms with low BP.  She was sent home from the hospital in 10/20 on hydralazine/Imdur but had to stop due to low BP.    Echo 4/21: EF 25% with septal-lateral dyssynchrony, normal RV.  Medtronic CRT-D device was placed in 6/21.  Repeat echo 1/22: EF up to 50-55% with normal RV.   Seen for virtual visit 04/22 d/t COVID-19 pandemic. Had been doing well. No med changes. Noted drop in BiV pacing percentage with episodes of atrial fibrillation. F/u recommended in 3 months.  Furosemide 20 mg daily started on 01/30/22 due to elevated OptiVol fluid index.   Echo 8/23, EF 50-55%, normal RV, normal IVC.    Today she returns for AHF follow up. Overall feeling ***. Denies palpitations, CP, dizziness, edema, or PND/Orthopnea. *** SOB. Appetite ok. No fever or chills. Weight at home *** pounds. Taking all medications. ETOH, smoking *** Follows with Nephrology at [facility], turned down for renal transplant.  ECG (personally reviewed ): v-paced, 66 bpm ***  MDT device interrogation: 98.3% BiV paced, stable thoracic impedance, no recent AF/VT, 1.4 hr/day activity (personally reviewed). ***  Echo today ***  official read pending  Labs (11/20): creatinine 1.5 Labs (12/20): creatinine 2.47 Labs (1/21): creatinine 2.1 => 2.04, BNP 54 Labs (6/21): creatinine 2.33 Labs (7/21): K 5.7, creatinine 2.61 Labs (9/21): K 4.5, creatinine 2.38 Labs (4/22): K 5.5, creatinine 2.5, LDL 91 Labs (8/23): K 3.5, creatinine 2.5, hgb 11.6, LDL 97 Labs (1/24): K 3.5, creatinine 2.7 Labs (7/24): K 3.6, creatinine 3.3 Labs 11/24): K 3.9, SCr 3.2  PMH: 1. H/o right nephrectomy for renal abscess in 10/20.  2. Multiple Sclerosis 3. HTN 4. H/o colon cancer: s/p colectomy with colostomy.  5. CKD: Stage 4. Sees a nephrologist at [facility].  6. Frequent UTIs.  7. Chronic systolic CHF: Nonischemic cardiomyopathy. Medtronic CRT-D device.  - Echo (10/20): EF 25%, apical ballooning.  - LHC (10/20): Normal coronaries.  - Echo (12/20): EF 25-30%, peri-apical akinesis, normal RV size and systolic function (similar to 95/28 echo).   - Echo (4/21): EF 25%, septal-lateral dyssynchrony - Echo (1/22): EF 50-55%, normal RV.  - Echo (8/23): EF 50-55%, normal RV, normal IVC.  8. LBBB 9. Atrial fibrillation: Paroxysmal.   Social History   Socioeconomic History   Marital status: Married    Spouse name: Not on file   Number of children: Not on file   Years of education: Not on file   Highest education level: Not on file  Occupational History   Not on file  Tobacco Use   Smoking status: Never   Smokeless tobacco: Never  Substance and  Sexual Activity   Alcohol use: Not Currently   Drug use: Never   Sexual activity: Not on file  Other Topics Concern   Not on file  Social History Narrative   Not on file   Social Determinants of Health   Financial Resource Strain: Low Risk  (05/18/2022)   [facility]16[facility] visits prior to 12/16/2022.   Overall Financial Resource Strain (CARDIA)    Difficulty of Paying  Living Expenses: Not hard at all  Food Insecurity: Low Risk  (06/04/2023)   [facility]   Hunger Vital Sign    Worried About Running Out of Food in the Last Year: Never true    Ran Out of Food in the Last Year: Never true  Transportation Needs: Not on file (06/04/2023)  Physical Activity: Sufficiently Active (05/18/2022)   [facility] prior to 12/16/2022[facility] visits prior to 12/16/2022.   Exercise Vital Sign    Days of Exercise per Week: 5 days    Minutes of Exercise per Session: 30 min  Stress: No Stress Concern Present (05/18/2022)   [facility] prior to 12/16/2022., [facility] visits prior to 12/16/2022.   [employer] of Occupational Health - Occupational Stress Questionnaire    Feeling of Stress : Not at all  Social Connections: Socially Integrated (05/18/2022)   [facility] visits prior to 12/16/2022., [facility] visits prior to 12/16/2022.   Social Advertising account executive [NHANES]    Frequency of Communication with Friends and Family: More than three times a week    Frequency of Social Gatherings with Friends and Family: More than three times a week    Attends Religious Services: More than 4 times per year    Active Member of [employer] or Organizations: Yes    Attends Banker Meetings: More than 4 times per year    Marital Status: Married  Catering manager Violence: Not At Risk (05/18/2022)   [facility] prior to 12/16/2022., [facility] visits prior to 12/16/2022.   Humiliation, Afraid, Rape, and Kick questionnaire    Fear of Current or Ex-Partner: No    Emotionally Abused: No    Physically Abused: No    Sexually  Abused: No   Family History  Problem Relation Age of Onset   Atrial fibrillation Father    Diabetes Mellitus II Neg Hx    ROS: All systems reviewed and negative except as per HPI.   Current Outpatient Medications  Medication Sig Dispense Refill   apixaban (ELIQUIS) 5 MG TABS tablet Take 1 tablet (5 mg total) by mouth 2 (two) times daily. 60 tablet 11   carvedilol (COREG) 6.25 MG tablet TAKE 1 TABLET(6.25 MG) BY MOUTH TWICE DAILY WITH A MEAL 180 tablet 1   cholecalciferol (VITAMIN D3) 25 MCG (1000 UT) tablet Take 1,000 Units by mouth in the morning and at bedtime.      Cyanocobalamin (VITAMIN B12 PO) Take 1 tablet by mouth daily.      furosemide (LASIX) 20 MG tablet Take 2 tablets (40 mg total) by mouth daily AND 1 tablet (20 mg total) daily. ALTERNATING. 90 tablet 3  magnesium oxide (MAG-OX) 400 MG tablet Take 400 mg by mouth daily.     potassium chloride (KLOR-CON) 10 MEQ tablet TAKE 1 TABLET BY MOUTH DAILY WHEN YOU TAKE FUROSEMIDE 45 tablet 3   Probiotic Product (PROBIOTIC DAILY PO) Take 1 capsule by mouth daily.     No current facility-administered medications for this visit.   Wt Readings from Last 3 Encounters:  05/08/23 76.3 kg (168 lb 3.2 oz)  03/22/23 77.1 kg (170 lb)  11/07/22 77.1 kg (170 lb)   There were no vitals taken for this visit.  Physical Exam:   General:  *** appearing.  No respiratory difficulty HEENT: normal Neck: supple. JVD *** cm. Carotids 2+ bilat; no bruits. No lymphadenopathy or thyromegaly appreciated. Cor: PMI nondisplaced. Regular rate & rhythm. No rubs, gallops or murmurs. Lungs: clear Abdomen: soft, nontender, nondistended. No hepatosplenomegaly. No bruits or masses. Good bowel sounds. Extremities: no cyanosis, clubbing, rash, edema  Neuro: alert & oriented x 3, cranial nerves grossly intact. moves all 4 extremities w/o difficulty. Affect pleasant.   Assessment/Plan:  1. Chronic systolic CHF: Nonischemic cardiomyopathy, LHC in 10/20 with no  significant coronary disease.  Echo in 10/20 was suggestive of stress (Takotsubo-type) cardiomyopathy with apical ballooning (from severe urosepsis).  However, LV function did not improve on repeat echo in 12/20 or echo in 4/21 (EF still 25%).  ?LBBB cardiomyopathy.  She is now s/p Medtronic CRT-D device. Repeat echo in 1/22 showed EF up to 50-55%, good response to CRT.  Echo 8/23 showed EF 50-55%.  NYHA class II symptoms, not volume overloaded by exam or Optivol.  Medical therapy limited by CKD IV. - Continue Lasix 20 mg daily. Labs 05/02/23 reviewed, K 3.6, SCr 3.3, eGFR 15 - Continue Coreg 6.25 mg bid.  - No ACEI/ARNI/ARB with CKD stage 4.  - Off spironolactone due to hyperkalemia.   - Off Jardiance with GFR < 20, also with recent UTI. - echo today, official read pending 2. CKD stage 4: S/p right nephrectomy in 10/20.  Sees nephrology at [facility]. Most recent creatinine 3.3. - She was turned down for renal transplant. - Nephrology to discuss potential dialysis candidacy next visit if renal function continues to decline. 3. Atrial fibrillation: Paroxysmal.  Noted after device implantation.  No recent episodes.   - Continue apixaban 5 mg bid. No bleeding issues. Hgb 11.6 (05/02/23). - No recent AF, if becomes more frequent and impedes BiV pacing, may need ablation.   Follow up in 4 months with Dr. Shirlee Latch ***  Alen Bleacher, NP  09/04/2023  [facility] [address] [facility] [place] [address] [phone] (office) [phone] (fax)

## 2023-09-05 ENCOUNTER — Telehealth: Payer: Self-pay

## 2023-09-05 NOTE — Telephone Encounter (Signed)
Remote ICM transmission received.  Attempted call to patient regarding ICM remote transmission and no answer.  

## 2023-09-05 NOTE — Progress Notes (Signed)
EPIC Encounter for ICM Monitoring  Patient Name: Jeanne Bruce is a 67 y.o. female Date: 09/05/2023 Primary Care Physican: Elijio Miles., MD Primary Cardiologist: Shirlee Latch Electrophysiologist: Elberta Fortis Bi-V Pacing: 98.2%        1/23//2024 Office Weight: 170 lbs 02/13/2023 Weight: 165 lbs 04/18/2023 Weight: 165 lbs   Time in AT/AF  <0.1 hr/day (<0.1%)                                                           Attempted call to patient and unable to reach.   Transmission reviewed.    Optivol thoracic impedance suggesting possible fluid accumulation from 10/15-10/23 and 11/3-11/15.    Prescribed:  Furosemide 20 mg take 1 tablet by mouth every other day alternating with 40 mg every other day.  Confirmed 10/18 taking Lasix as prescribed.  Potassium 10 mEq take 1 tablet by mouth on the days that Furosemide 40 mg is taken.   Labs: 06/07/2023 Creatinine 2.86, BUN 69, Potassium 4.0, Sodium 137, GFR 17  05/21/2023 Creatinine 2.36, BUN 57, Potassium 4.2, Sodium 140, GFR 22  05/02/2023 Creatinine 3.3,   BUN 82, Potassium 3.6, Sodium 136, GFR 15 04/26/2023 Creatinine 3.39, BUN 56, Potassium 4.0, Sodium 138, GFR 14 04/24/2023 Creatinine 2.84, BUN 52, Potassium 3.5, Sodium 135, GFR 12 02/19/2023 Creatinine 2.81, BUN 51, Potassium 4.1, Sodium 140, GFR 18 12/05/2022 Creatinine 2.50, BUN 40, Potassium 3.7, Sodium 139, GFR 20 10/19/2022 Creatinine 2.7,   BUN 46, Potassium 3.5, Sodium 140, GFR 19 06/01/2022 Creatinine 2.50, BUN 37, Potassium 3.5, Sodium 140, GFR 20 A complete set of results can be found in Results Review.   Recommendations:  Unable to reach.     Follow-up plan: ICM clinic phone appointment on 10/15/2023 (fluid levels will be checked at 11/27 OV).   91 day device clinic remote transmission 10/01/2023.     EP/Cardiology Office Visits:  09/12/2023 with HF Clinic.   Recall 03/17/2024 with Dr. Elberta Fortis.       Copy of ICM check sent to Dr. Elberta Fortis.  3 month ICM trend: 09/03/2023.    12-14  Month ICM trend:     Karie Soda, RN 09/05/2023 7:51 AM

## 2023-09-12 ENCOUNTER — Encounter (HOSPITAL_COMMUNITY): Payer: Self-pay

## 2023-09-12 ENCOUNTER — Ambulatory Visit (HOSPITAL_BASED_OUTPATIENT_CLINIC_OR_DEPARTMENT_OTHER)
Admission: RE | Admit: 2023-09-12 | Discharge: 2023-09-12 | Disposition: A | Discharge status: Home / Self Care | Payer: Medicare HMO | Source: Ambulatory Visit | Attending: Internal Medicine | Admitting: Internal Medicine

## 2023-09-12 ENCOUNTER — Ambulatory Visit (HOSPITAL_COMMUNITY)
Admission: RE | Admit: 2023-09-12 | Discharge: 2023-09-12 | Disposition: A | Discharge status: Home / Self Care | Payer: Medicare HMO | Source: Ambulatory Visit | Attending: Cardiology | Admitting: Cardiology

## 2023-09-12 VITALS — BP 120/80 | HR 78 | Wt 175.0 lb

## 2023-09-12 DIAGNOSIS — I5022 Chronic systolic (congestive) heart failure: Secondary | ICD-10-CM | POA: Diagnosis not present

## 2023-09-12 DIAGNOSIS — I447 Left bundle-branch block, unspecified: Secondary | ICD-10-CM | POA: Insufficient documentation

## 2023-09-12 DIAGNOSIS — E875 Hyperkalemia: Secondary | ICD-10-CM | POA: Insufficient documentation

## 2023-09-12 DIAGNOSIS — G35 Multiple sclerosis: Secondary | ICD-10-CM | POA: Insufficient documentation

## 2023-09-12 DIAGNOSIS — Z87440 Personal history of urinary (tract) infections: Secondary | ICD-10-CM | POA: Diagnosis not present

## 2023-09-12 DIAGNOSIS — I428 Other cardiomyopathies: Secondary | ICD-10-CM | POA: Insufficient documentation

## 2023-09-12 DIAGNOSIS — I4901 Ventricular fibrillation: Secondary | ICD-10-CM | POA: Insufficient documentation

## 2023-09-12 DIAGNOSIS — Z85038 Personal history of other malignant neoplasm of large intestine: Secondary | ICD-10-CM | POA: Diagnosis not present

## 2023-09-12 DIAGNOSIS — Z79899 Other long term (current) drug therapy: Secondary | ICD-10-CM | POA: Insufficient documentation

## 2023-09-12 DIAGNOSIS — I480 Paroxysmal atrial fibrillation: Secondary | ICD-10-CM | POA: Insufficient documentation

## 2023-09-12 DIAGNOSIS — N184 Chronic kidney disease, stage 4 (severe): Secondary | ICD-10-CM

## 2023-09-12 DIAGNOSIS — Z8673 Personal history of transient ischemic attack (TIA), and cerebral infarction without residual deficits: Secondary | ICD-10-CM | POA: Insufficient documentation

## 2023-09-12 DIAGNOSIS — I081 Rheumatic disorders of both mitral and tricuspid valves: Secondary | ICD-10-CM | POA: Diagnosis not present

## 2023-09-12 DIAGNOSIS — I130 Hypertensive heart and chronic kidney disease with heart failure and stage 1 through stage 4 chronic kidney disease, or unspecified chronic kidney disease: Secondary | ICD-10-CM | POA: Insufficient documentation

## 2023-09-12 DIAGNOSIS — Z7901 Long term (current) use of anticoagulants: Secondary | ICD-10-CM | POA: Diagnosis not present

## 2023-09-12 DIAGNOSIS — Z905 Acquired absence of kidney: Secondary | ICD-10-CM | POA: Diagnosis not present

## 2023-09-12 DIAGNOSIS — Z933 Colostomy status: Secondary | ICD-10-CM | POA: Insufficient documentation

## 2023-09-12 LAB — ECHOCARDIOGRAM COMPLETE
Area-P 1/2: 3.89 cm2
S' Lateral: 2.3 cm

## 2023-09-12 NOTE — Patient Instructions (Signed)
There has been no changes to your medications.  Your physician recommends that you schedule a follow-up appointment in: 4 months (March 2025) ** PLEASE CALL THE OFFICE IN [place] TO ARRANGE YOUR FOLLOW UP APPOINTMENT. **  If you have any questions or concerns before your next appointment please send [place] a message through [place] or call our office at [phone].    TO LEAVE A MESSAGE FOR THE NURSE SELECT OPTION 2, PLEASE LEAVE A MESSAGE INCLUDING: YOUR NAME DATE OF BIRTH CALL BACK NUMBER REASON FOR CALL**this is important as we prioritize the call backs  YOU WILL RECEIVE A CALL BACK THE SAME DAY AS LONG AS YOU CALL BEFORE 4:00 PM  At the [facility], you and your health needs are our priority. As part of our continuing mission to provide you with exceptional heart care, we have created designated Provider Care Teams. These Care Teams include your primary Cardiologist (physician) and Advanced Practice Providers (APPs- Physician Assistants and Nurse Practitioners) who all work together to provide you with the care you need, when you need it.   You may see any of the following providers on your designated Care Team at your next follow up: Dr Arvilla Meres Dr Marca Ancona Dr. Dorthula Nettles Dr. Clearnce Hasten Amy Filbert Schilder, NP Robbie Lis, [facility] [place], [place] Brynda Peon, NP [place] Lee, NP Karle Plumber, PharmD   Please be sure to bring in all your medications bottles to every appointment.    Thank you for choosing [facility]

## 2023-10-01 ENCOUNTER — Ambulatory Visit (INDEPENDENT_AMBULATORY_CARE_PROVIDER_SITE_OTHER): Payer: Medicare HMO

## 2023-10-01 DIAGNOSIS — I5022 Chronic systolic (congestive) heart failure: Secondary | ICD-10-CM

## 2023-10-01 DIAGNOSIS — I420 Dilated cardiomyopathy: Secondary | ICD-10-CM

## 2023-10-01 LAB — CUP PACEART REMOTE DEVICE CHECK
Battery Remaining Longevity: 77 mo
Battery Voltage: 2.99 V
Brady Statistic AP VP Percent: 0.01 %
Brady Statistic AP VS Percent: 0 %
Brady Statistic AS VP Percent: 97.95 %
Brady Statistic AS VS Percent: 2.04 %
Brady Statistic RA Percent Paced: 0.02 %
Brady Statistic RV Percent Paced: 8.62 %
Date Time Interrogation Session: 20241216012202
HighPow Impedance: 88 Ohm
Implantable Lead Connection Status: 753985
Implantable Lead Connection Status: 753985
Implantable Lead Connection Status: 753985
Implantable Lead Implant Date: 20210621
Implantable Lead Implant Date: 20210621
Implantable Lead Implant Date: 20210621
Implantable Lead Location: 753858
Implantable Lead Location: 753859
Implantable Lead Location: 753860
Implantable Lead Model: 4398
Implantable Lead Model: 5076
Implantable Pulse Generator Implant Date: 20210621
Lead Channel Impedance Value: 166.114
Lead Channel Impedance Value: 166.114
Lead Channel Impedance Value: 171 Ohm
Lead Channel Impedance Value: 174.595
Lead Channel Impedance Value: 180 Ohm
Lead Channel Impedance Value: 323 Ohm
Lead Channel Impedance Value: 323 Ohm
Lead Channel Impedance Value: 342 Ohm
Lead Channel Impedance Value: 342 Ohm
Lead Channel Impedance Value: 342 Ohm
Lead Channel Impedance Value: 342 Ohm
Lead Channel Impedance Value: 380 Ohm
Lead Channel Impedance Value: 380 Ohm
Lead Channel Impedance Value: 589 Ohm
Lead Channel Impedance Value: 589 Ohm
Lead Channel Impedance Value: 589 Ohm
Lead Channel Impedance Value: 627 Ohm
Lead Channel Impedance Value: 627 Ohm
Lead Channel Pacing Threshold Amplitude: 0.5 V
Lead Channel Pacing Threshold Amplitude: 0.5 V
Lead Channel Pacing Threshold Pulse Width: 0.4 ms
Lead Channel Pacing Threshold Pulse Width: 0.4 ms
Lead Channel Sensing Intrinsic Amplitude: 12.875 mV
Lead Channel Sensing Intrinsic Amplitude: 12.875 mV
Lead Channel Sensing Intrinsic Amplitude: 2.375 mV
Lead Channel Sensing Intrinsic Amplitude: 2.375 mV
Lead Channel Setting Pacing Amplitude: 1.5 V
Lead Channel Setting Pacing Amplitude: 2 V
Lead Channel Setting Pacing Amplitude: 2.5 V
Lead Channel Setting Pacing Pulse Width: 0.4 ms
Lead Channel Setting Pacing Pulse Width: 0.4 ms
Lead Channel Setting Sensing Sensitivity: 0.3 mV
Zone Setting Status: 755011
Zone Setting Status: 755011

## 2023-10-15 ENCOUNTER — Ambulatory Visit: Payer: Medicare HMO | Attending: Cardiology

## 2023-10-15 DIAGNOSIS — I5022 Chronic systolic (congestive) heart failure: Secondary | ICD-10-CM

## 2023-10-15 DIAGNOSIS — Z95810 Presence of automatic (implantable) cardiac defibrillator: Secondary | ICD-10-CM

## 2023-10-19 ENCOUNTER — Telehealth: Payer: Self-pay

## 2023-10-19 NOTE — Telephone Encounter (Signed)
 Remote ICM transmission received.  Attempted call to patient regarding ICM remote transmission and no answer.

## 2023-10-24 ENCOUNTER — Other Ambulatory Visit (HOSPITAL_COMMUNITY): Payer: Self-pay | Admitting: *Deleted

## 2023-10-24 ENCOUNTER — Telehealth (HOSPITAL_COMMUNITY): Payer: Self-pay | Admitting: *Deleted

## 2023-10-24 DIAGNOSIS — I5022 Chronic systolic (congestive) heart failure: Secondary | ICD-10-CM

## 2023-10-24 MED ORDER — FUROSEMIDE 20 MG PO TABS: ORAL_TABLET | ORAL | 3 refills | Status: DC

## 2023-10-24 MED ORDER — FUROSEMIDE 20 MG PO TABS: 20.0000 mg | ORAL_TABLET | Freq: Every day | ORAL | 3 refills | Status: DC

## 2023-10-24 NOTE — Telephone Encounter (Signed)
 Pt called Heart Failure Clinic to confirm her current Lasix dose as 40 mg alternating with 20 mg. Updated chart and pharmacy. She says she does not need refill at this time.

## 2023-10-24 NOTE — Addendum Note (Signed)
 Addended by: Hessie Diener B on: 10/24/2023 03:22 PM   Modules accepted: Orders

## 2023-11-05 NOTE — Progress Notes (Signed)
Remote ICD transmission.   

## 2023-11-19 ENCOUNTER — Ambulatory Visit: Payer: Self-pay | Attending: Cardiology

## 2023-11-19 DIAGNOSIS — I5022 Chronic systolic (congestive) heart failure: Secondary | ICD-10-CM

## 2023-11-19 DIAGNOSIS — Z95810 Presence of automatic (implantable) cardiac defibrillator: Secondary | ICD-10-CM

## 2023-11-20 NOTE — Progress Notes (Signed)
EPIC Encounter for ICM Monitoring  Patient Name: Jeanne Bruce is a 68 y.o. female Date: 11/20/2023 Primary Care Physican: Elijio Miles., MD Primary Cardiologist: Shirlee Latch Electrophysiologist: Elberta Fortis Bi-V Pacing: 97.6%        04/18/2023 Weight: 165 lbs 11/20/2023 Weight: 165 lbs   Time in AT/AF <0.1 hr/day (<0.1%)                                                Spoke with patient and heart failure questions reviewed.  Transmission results reviewed.  Pt asymptomatic for fluid accumulation.  She does not experience symptoms during decreased impedance.  Diet: Not strict on salt intake.   Optivol thoracic impedance suggesting possible fluid accumulation starting 1/23 but trending back close to baseline.    Prescribed:  Furosemide 20 mg take 1 tablet by mouth every other day alternating with 40 mg every other day.  Confirmed 10/18 taking Lasix as prescribed.  Potassium 10 mEq take 1 tablet by mouth on the days that Furosemide 40 mg is taken.   Labs: 08/21/2023 Creatinine 3.2    BUN 56, Potassium 3.9, Sodium 140, GFR 15 06/07/2023 Creatinine 2.86, BUN 69, Potassium 4.0, Sodium 137, GFR 17  05/21/2023 Creatinine 2.36, BUN 57, Potassium 4.2, Sodium 140, GFR 22  05/02/2023 Creatinine 3.3,   BUN 82, Potassium 3.6, Sodium 136, GFR 15 04/26/2023 Creatinine 3.39, BUN 56, Potassium 4.0, Sodium 138, GFR 14 04/24/2023 Creatinine 2.84, BUN 52, Potassium 3.5, Sodium 135, GFR 12 02/19/2023 Creatinine 2.81, BUN 51, Potassium 4.1, Sodium 140, GFR 18 12/05/2022 Creatinine 2.50, BUN 40, Potassium 3.7, Sodium 139, GFR 20 10/19/2022 Creatinine 2.7,   BUN 46, Potassium 3.5, Sodium 140, GFR 19 06/01/2022 Creatinine 2.50, BUN 37, Potassium 3.5, Sodium 140, GFR 20 A complete set of results can be found in Results Review.   Recommendations:  Recommendation to limit salt intake and fluid intake daily.  Encouraged to call if experiencing any fluid symptoms.    Follow-up plan: ICM clinic phone appointment on 12/24/2023.    91 day device clinic remote transmission 12/31/2023.     EP/Cardiology Office Visits:  Advised to call Dr Alford Highland office to schedule March appt.  Recall 01/10/2024 with Dr Shirlee Latch.   Recall 03/17/2024 with Dr. Elberta Fortis.       Copy of ICM check sent to Dr. Elberta Fortis.  3 month ICM trend: 11/19/2023.    12-14 Month ICM trend:     Karie Soda, RN 11/20/2023 12:19 PM

## 2023-12-24 ENCOUNTER — Ambulatory Visit: Payer: Self-pay | Attending: Cardiology

## 2023-12-24 DIAGNOSIS — Z95810 Presence of automatic (implantable) cardiac defibrillator: Secondary | ICD-10-CM

## 2023-12-24 DIAGNOSIS — I5022 Chronic systolic (congestive) heart failure: Secondary | ICD-10-CM

## 2023-12-27 ENCOUNTER — Telehealth: Payer: Self-pay

## 2023-12-27 NOTE — Progress Notes (Signed)
 EPIC Encounter for ICM Monitoring  Patient Name: Jeanne Bruce is a 68 y.o. female Date: 12/27/2023 Primary Care Physican: Elijio Miles., MD Primary Cardiologist: Shirlee Latch Electrophysiologist: Elberta Fortis Bi-V Pacing: 98.1%        04/18/2023 Weight: 165 lbs 11/20/2023 Weight: 165 lbs   Time in AT/AF <0.1 hr/day (<0.1%)                                                Attempted call to patient and unable to reach.    Transmission results reviewed.    Diet: Not strict on salt intake.   Optivol thoracic impedance suggesting intermittent days with possible fluid accumulation within the last month.    Prescribed:  Furosemide 20 mg take 1 tablet by mouth every other day alternating with 40 mg every other day.   Potassium 10 mEq take 1 tablet by mouth on the days that Furosemide 40 mg is taken.   Labs: 08/21/2023 Creatinine 3.2    BUN 56, Potassium 3.9, Sodium 140, GFR 15 06/07/2023 Creatinine 2.86, BUN 69, Potassium 4.0, Sodium 137, GFR 17  05/21/2023 Creatinine 2.36, BUN 57, Potassium 4.2, Sodium 140, GFR 22  05/02/2023 Creatinine 3.3,   BUN 82, Potassium 3.6, Sodium 136, GFR 15 04/26/2023 Creatinine 3.39, BUN 56, Potassium 4.0, Sodium 138, GFR 14 A complete set of results can be found in Results Review.   Recommendations:   Unable to reach.     Follow-up plan: ICM clinic phone appointment on 01/28/2024.   91 day device clinic remote transmission 03/31/2024.     EP/Cardiology Office Visits:  Recall 01/10/2024 with Dr Shirlee Latch.   Recall 03/17/2024 with Dr. Elberta Fortis.       Copy of ICM check sent to Dr. Elberta Fortis.  3 month ICM trend: 12/24/2023.    12-14 Month ICM trend:     Karie Soda, RN 12/27/2023 8:08 AM

## 2023-12-27 NOTE — Telephone Encounter (Signed)
Remote ICM transmission received.  Attempted call to patient regarding ICM remote transmission and mail box is full. 

## 2023-12-31 ENCOUNTER — Ambulatory Visit (INDEPENDENT_AMBULATORY_CARE_PROVIDER_SITE_OTHER): Payer: Medicare HMO

## 2023-12-31 DIAGNOSIS — I420 Dilated cardiomyopathy: Secondary | ICD-10-CM

## 2023-12-31 DIAGNOSIS — I5022 Chronic systolic (congestive) heart failure: Secondary | ICD-10-CM

## 2024-01-01 LAB — CUP PACEART REMOTE DEVICE CHECK
Battery Remaining Longevity: 73 mo
Battery Voltage: 2.99 V
Brady Statistic AP VP Percent: 0.01 %
Brady Statistic AP VS Percent: 0 %
Brady Statistic AS VP Percent: 98.01 %
Brady Statistic AS VS Percent: 1.98 %
Brady Statistic RA Percent Paced: 0.02 %
Brady Statistic RV Percent Paced: 2.82 %
Date Time Interrogation Session: 20250317033623
HighPow Impedance: 76 Ohm
Implantable Lead Connection Status: 753985
Implantable Lead Connection Status: 753985
Implantable Lead Connection Status: 753985
Implantable Lead Implant Date: 20210621
Implantable Lead Implant Date: 20210621
Implantable Lead Implant Date: 20210621
Implantable Lead Location: 753858
Implantable Lead Location: 753859
Implantable Lead Location: 753860
Implantable Lead Model: 4398
Implantable Lead Model: 5076
Implantable Pulse Generator Implant Date: 20210621
Lead Channel Impedance Value: 156.606
Lead Channel Impedance Value: 160.941
Lead Channel Impedance Value: 160.941
Lead Channel Impedance Value: 166.114
Lead Channel Impedance Value: 171 Ohm
Lead Channel Impedance Value: 304 Ohm
Lead Channel Impedance Value: 304 Ohm
Lead Channel Impedance Value: 323 Ohm
Lead Channel Impedance Value: 323 Ohm
Lead Channel Impedance Value: 342 Ohm
Lead Channel Impedance Value: 342 Ohm
Lead Channel Impedance Value: 342 Ohm
Lead Channel Impedance Value: 380 Ohm
Lead Channel Impedance Value: 532 Ohm
Lead Channel Impedance Value: 570 Ohm
Lead Channel Impedance Value: 570 Ohm
Lead Channel Impedance Value: 589 Ohm
Lead Channel Impedance Value: 589 Ohm
Lead Channel Pacing Threshold Amplitude: 0.375 V
Lead Channel Pacing Threshold Amplitude: 0.75 V
Lead Channel Pacing Threshold Pulse Width: 0.4 ms
Lead Channel Pacing Threshold Pulse Width: 0.4 ms
Lead Channel Sensing Intrinsic Amplitude: 10 mV
Lead Channel Sensing Intrinsic Amplitude: 10 mV
Lead Channel Sensing Intrinsic Amplitude: 2.5 mV
Lead Channel Sensing Intrinsic Amplitude: 2.5 mV
Lead Channel Setting Pacing Amplitude: 1.5 V
Lead Channel Setting Pacing Amplitude: 2 V
Lead Channel Setting Pacing Amplitude: 2.5 V
Lead Channel Setting Pacing Pulse Width: 0.4 ms
Lead Channel Setting Pacing Pulse Width: 0.4 ms
Lead Channel Setting Sensing Sensitivity: 0.3 mV
Zone Setting Status: 755011
Zone Setting Status: 755011

## 2024-01-04 ENCOUNTER — Other Ambulatory Visit (HOSPITAL_COMMUNITY): Payer: Self-pay | Admitting: Cardiology

## 2024-01-28 ENCOUNTER — Ambulatory Visit: Payer: Self-pay | Attending: Cardiology

## 2024-01-28 DIAGNOSIS — Z95810 Presence of automatic (implantable) cardiac defibrillator: Secondary | ICD-10-CM

## 2024-01-28 DIAGNOSIS — I5022 Chronic systolic (congestive) heart failure: Secondary | ICD-10-CM

## 2024-01-29 ENCOUNTER — Telehealth: Payer: Self-pay

## 2024-01-29 NOTE — Telephone Encounter (Signed)
 Remote ICM transmission received.  Attempted call to patient regarding ICM remote transmission and no answer.

## 2024-01-29 NOTE — Progress Notes (Signed)
 EPIC Encounter for ICM Monitoring  Patient Name: Jeanne Bruce is a 68 y.o. female Date: 01/29/2024 Primary Care Physican: Sheilah Denver., MD Primary Care Physican: Sheilah Denver., MD Primary Cardiologist: Mitzie Anda Electrophysiologist: Lawana Pray Bi-V Pacing: 97.8%        04/18/2023 Weight: 165 lbs 11/20/2023 Weight: 165 lbs   Time in AT/AF <0.1 hr/day (<0.1%)                                                Attempted call to patient and unable to reach.  Transmission results reviewed.    Diet: Not strict on salt intake.   Optivol thoracic impedance suggesting possible fluid accumulation starting 4/7 and trending back toward baseline.    Prescribed:  Furosemide 20 mg take 1 tablet by mouth every other day alternating with 40 mg every other day.   Potassium 10 mEq take 1 tablet by mouth on the days that Furosemide 40 mg is taken.   Labs: 01/17/2024 Creatinine 3.16, BUN 54, Potassium 3.7, Sodium 139, GFR 15 08/21/2023 Creatinine 3.2    BUN 56, Potassium 3.9, Sodium 140, GFR 15 06/07/2023 Creatinine 2.86, BUN 69, Potassium 4.0, Sodium 137, GFR 17  05/21/2023 Creatinine 2.36, BUN 57, Potassium 4.2, Sodium 140, GFR 22  05/02/2023 Creatinine 3.3,   BUN 82, Potassium 3.6, Sodium 136, GFR 15 04/26/2023 Creatinine 3.39, BUN 56, Potassium 4.0, Sodium 138, GFR 14 A complete set of results can be found in Results Review.   Recommendations:   Unable to reach.   Will send to HF clinic for review if patient is reached.   Follow-up plan: ICM clinic phone appointment on 02/04/2024 to recheck fluid levels.   91 day device clinic remote transmission 03/31/2024.     EP/Cardiology Office Visits:  03/03/2024 with Dr Mitzie Anda.   Recall 03/17/2024 with Dr. Lawana Pray.       Copy of ICM check sent to Dr. Lawana Pray.  3 month ICM trend: 01/28/2024.    12-14 Month ICM trend:     [name], RN 01/29/2024 9:21 AM

## 2024-02-04 ENCOUNTER — Ambulatory Visit: Payer: Self-pay | Attending: Cardiology

## 2024-02-04 DIAGNOSIS — Z95810 Presence of automatic (implantable) cardiac defibrillator: Secondary | ICD-10-CM

## 2024-02-04 DIAGNOSIS — I5022 Chronic systolic (congestive) heart failure: Secondary | ICD-10-CM

## 2024-02-05 NOTE — Progress Notes (Signed)
 EPIC Encounter for ICM Monitoring  Patient Name: Jeanne Bruce is a 68 y.o. female Date: 02/05/2024 Primary Care Physican: Sheilah Denver., MD Primary Cardiologist: Mitzie Anda Electrophysiologist: Lawana Pray Bi-V Pacing: 97.6%        04/18/2023 Weight: 165 lbs 11/20/2023 Weight: 165 lbs   Time in AT/AF 0.0 hr/day (0.0%)                                              Transmission results reviewed.    Diet: Not strict on salt intake.   Optivol thoracic impedance suggesting fluid levels returned to normal starting 4/17.   Prescribed:  Furosemide  20 mg take 1 tablet by mouth every other day alternating with 40 mg every other day.   Potassium 10 mEq take 1 tablet by mouth on the days that Furosemide  40 mg is taken.   Labs: 01/17/2024 Creatinine 3.16, BUN 54, Potassium 3.7, Sodium 139, GFR 15 08/21/2023 Creatinine 3.2    BUN 56, Potassium 3.9, Sodium 140, GFR 15 06/07/2023 Creatinine 2.86, BUN 69, Potassium 4.0, Sodium 137, GFR 17  05/21/2023 Creatinine 2.36, BUN 57, Potassium 4.2, Sodium 140, GFR 22  05/02/2023 Creatinine 3.3,   BUN 82, Potassium 3.6, Sodium 136, GFR 15 04/26/2023 Creatinine 3.39, BUN 56, Potassium 4.0, Sodium 138, GFR 14 A complete set of results can be found in Results Review.   Recommendations:   No change.   Follow-up plan: ICM clinic phone appointment on 03/03/2024.   91 day device clinic remote transmission 03/31/2024.     EP/Cardiology Office Visits:  03/03/2024 with Dr Mitzie Anda.   Recall 03/17/2024 with Dr. Lawana Pray.       Copy of ICM check sent to Dr. Lawana Pray.  3 month ICM trend: 02/04/2024.    12-14 Month ICM trend:     [name], RN 02/05/2024 4:05 PM

## 2024-02-15 NOTE — Addendum Note (Signed)
 Addended by: Edra Govern D on: 02/15/2024 03:06 PM   Modules accepted: Orders

## 2024-02-15 NOTE — Progress Notes (Signed)
 Remote ICD transmission.

## 2024-03-03 ENCOUNTER — Ambulatory Visit (HOSPITAL_COMMUNITY)
Admission: RE | Admit: 2024-03-03 | Discharge: 2024-03-03 | Disposition: A | Discharge status: Home / Self Care | Payer: Self-pay | Source: Ambulatory Visit | Attending: Cardiology | Admitting: Cardiology

## 2024-03-03 ENCOUNTER — Ambulatory Visit (HOSPITAL_COMMUNITY): Payer: Self-pay | Admitting: Cardiology

## 2024-03-03 ENCOUNTER — Ambulatory Visit (INDEPENDENT_AMBULATORY_CARE_PROVIDER_SITE_OTHER)

## 2024-03-03 VITALS — BP 102/74 | HR 98 | Wt 172.4 lb

## 2024-03-03 DIAGNOSIS — Z905 Acquired absence of kidney: Secondary | ICD-10-CM | POA: Diagnosis not present

## 2024-03-03 DIAGNOSIS — Z85038 Personal history of other malignant neoplasm of large intestine: Secondary | ICD-10-CM | POA: Diagnosis not present

## 2024-03-03 DIAGNOSIS — G4733 Obstructive sleep apnea (adult) (pediatric): Secondary | ICD-10-CM | POA: Diagnosis not present

## 2024-03-03 DIAGNOSIS — Z95810 Presence of automatic (implantable) cardiac defibrillator: Secondary | ICD-10-CM

## 2024-03-03 DIAGNOSIS — N184 Chronic kidney disease, stage 4 (severe): Secondary | ICD-10-CM | POA: Insufficient documentation

## 2024-03-03 DIAGNOSIS — I428 Other cardiomyopathies: Secondary | ICD-10-CM | POA: Diagnosis not present

## 2024-03-03 DIAGNOSIS — I5022 Chronic systolic (congestive) heart failure: Secondary | ICD-10-CM | POA: Diagnosis present

## 2024-03-03 DIAGNOSIS — I480 Paroxysmal atrial fibrillation: Secondary | ICD-10-CM | POA: Insufficient documentation

## 2024-03-03 DIAGNOSIS — Z7901 Long term (current) use of anticoagulants: Secondary | ICD-10-CM | POA: Diagnosis not present

## 2024-03-03 DIAGNOSIS — G35 Multiple sclerosis: Secondary | ICD-10-CM | POA: Diagnosis not present

## 2024-03-03 LAB — BASIC METABOLIC PANEL WITH GFR
Anion gap: 12 (ref 5–15)
BUN: 51 mg/dL — ABNORMAL HIGH (ref 8–23)
CO2: 25 mmol/L (ref 22–32)
Calcium: 10.6 mg/dL — ABNORMAL HIGH (ref 8.9–10.3)
Chloride: 102 mmol/L (ref 98–111)
Creatinine, Ser: 3.99 mg/dL — ABNORMAL HIGH (ref 0.44–1.00)
GFR, Estimated: 12 mL/min — ABNORMAL LOW (ref >60)
Glucose, Bld: 178 mg/dL — ABNORMAL HIGH (ref 70–99)
Potassium: 3.4 mmol/L — ABNORMAL LOW (ref 3.5–5.1)
Sodium: 139 mmol/L (ref 135–145)

## 2024-03-03 LAB — CBC
HCT: 31 % — ABNORMAL LOW (ref 36.0–46.0)
Hemoglobin: 10.3 g/dL — ABNORMAL LOW (ref 12.0–15.0)
MCH: 32.5 pg (ref 26.0–34.0)
MCHC: 33.2 g/dL (ref 30.0–36.0)
MCV: 97.8 fL (ref 80.0–100.0)
Platelets: 186 10*3/uL (ref 150–400)
RBC: 3.17 MIL/uL — ABNORMAL LOW (ref 3.87–5.11)
RDW: 13.7 % (ref 11.5–15.5)
WBC: 7.2 10*3/uL (ref 4.0–10.5)
nRBC: 0 % (ref 0.0–0.2)

## 2024-03-03 LAB — TSH: TSH: 3.07 u[IU]/mL (ref 0.350–4.500)

## 2024-03-03 NOTE — Patient Instructions (Signed)
 There has been no changes to your medications.  Labs done today, your results will be available in MyChart, we will contact you for abnormal readings.  Your provider has recommended that you have a home sleep study ([facility]amar Test).  We have provided you with the equipment in our office today. Please go ahead and download the app. DO NOT OPEN OR TAMPER WITH THE BOX UNTIL WE ADVISE YOU TO DO SO. Once insurance has approved the test our office will call you with PIN number and approval to proceed with testing. Once you have completed the test you just dispose of the equipment, the information is automatically uploaded to us  via blue-tooth technology. If your test is positive for sleep apnea and you need a home CPAP machine you will be contacted by Dr Charl Concha office [facility]) to set this up.   Your physician recommends that you schedule a follow-up appointment in: 6 months.  If you have any questions or concerns before your next appointment please send us  a message through [place] or call our office at [phone].    TO LEAVE A MESSAGE FOR THE NURSE SELECT OPTION 2, PLEASE LEAVE A MESSAGE INCLUDING: YOUR NAME DATE OF BIRTH CALL BACK NUMBER REASON FOR CALL**this is important as we prioritize the call backs  YOU WILL RECEIVE A CALL BACK THE SAME DAY AS LONG AS YOU CALL BEFORE 4:00 PM  At the [facility], you and your health needs are our priority. As part of our continuing mission to provide you with exceptional heart care, we have created designated Provider Care Teams. These Care Teams include your primary Cardiologist (physician) and Advanced Practice Providers (APPs- Physician Assistants and Nurse Practitioners) who all work together to provide you with the care you need, when you need it.   You may see any of the following providers on your designated Care Team at your next follow up: Dr Jules Oar Dr Peder Bourdon Dr. Alwin Baars Dr. Arta Lark Amy  Marijane Shoulders, NP Ruddy Corral, [place] [facility] [place], [place] Dennise Fitz, NP [place] Lee, NP Shawnee Dellen, NP Luster Salters, PharmD Bevely Brush, PharmD   Please be sure to bring in all your medications bottles to every appointment.    Thank you for choosing [facility]

## 2024-03-03 NOTE — Progress Notes (Signed)
 ADVANCED HF CLINIC NOTE    Date:  03/03/2024  PCP:  Sheilah Denver., MD  Cardiologist:  Gaylyn Keas, MD HF Cardioligist: Dr. Mitzie Anda  Chief complaint: CHF  HPI: Jeanne Bruce is a 68 y.o. female with history of CKD stage 4, chronic systolic CHF, multiple sclerosis, and right nephrectomy.  She was referred by Dr. Micael Adas for CHF evaluation.  Patient also had remote colon cancer with colostomy.  She has had frequent UTIs and was admitted in 10/20 with urosepsis.  She had respiratory arrest and VF arrest in the ER and was intubated.  Echo showed EF 25% with apical ballooning.  LHC showed no significant coronary disease.  She ended up having right nephrectomy for renal abscess in 10/20.    Echo 12/20: EF still low at 25-30% with peri-apical akinesis.   She has had a hard time taking cardiac medications due to orthostatic symptoms with low BP.  She was sent home from the hospital in 10/20 on hydralazine /Imdur  but had to stop due to low BP.    Echo 4/21 showed EF 25% with septal-lateral dyssynchrony, normal RV.  Medtronic CRT-D device was placed in 6/21.  Repeat echo 1/22: EF up to 50-55% with normal RV.   Echo 8/23 showed EF 50-55%, normal RV, normal IVC.  Echo in 4/24 showed EF 60-65%, normal RV, mild-moderate TR.   Today she returns for followup of CHF.  Patient reports generalized fatigue.  She is no short of breath with usual activities.  No chest pain.  No palpitations or lightheadedness.  She snores at night.    ECG (personally reviewed): NSR, BiV pacing  MDT device interrogation: 98% BiV paced, stable thoracic impedance, no AF/VT.    Labs (11/20): creatinine 1.5 Labs (12/20): creatinine 2.47 Labs (1/21): creatinine 2.1 => 2.04, BNP 54 Labs (6/21): creatinine 2.33 Labs (7/21): K 5.7, creatinine 2.61 Labs (9/21): K 4.5, creatinine 2.38 Labs (4/22): K 5.5, creatinine 2.5, LDL 91 Labs (8/23): K 3.5, creatinine 2.5, hgb 11.6, LDL 97 Labs (1/24): K 3.5, creatinine 2.7 Labs (7/24):  K 3.6, creatinine 3.3 Labs (11/24): K 3.9, SCr 3.2 Labs (4/25): LDL 84, K 3.7, creatinine 0.98  PMH: 1. H/o right nephrectomy for renal abscess in 10/20.  2. Multiple Sclerosis 3. HTN 4. H/o colon cancer: s/p colectomy with colostomy.  5. CKD: Stage 4. Sees a nephrologist at [facility].  6. Frequent UTIs.  7. Chronic systolic CHF: Nonischemic cardiomyopathy. Medtronic CRT-D device.  - Echo (10/20): EF 25%, apical ballooning.  - LHC (10/20): Normal coronaries.  - Echo (12/20): EF 25-30%, peri-apical akinesis, normal RV size and systolic function (similar to 11/91 echo).   - Echo (4/21): EF 25%, septal-lateral dyssynchrony - Echo (1/22): EF 50-55%, normal RV.  - Echo (8/23): EF 50-55%, normal RV, normal IVC.  - Echo (4/24): EF 60-65%, normal RV, mild-moderate TR. 8. LBBB 9. Atrial fibrillation: Paroxysmal.   Social History   Socioeconomic History   Marital status: Married    Spouse name: Not on file   Number of children: Not on file   Years of education: Not on file   Highest education level: Not on file  Occupational History   Not on file  Tobacco Use   Smoking status: Never   Smokeless tobacco: Never  Substance and Sexual Activity   Alcohol use: Not Currently   Drug use: Never   Sexual activity: Not on file  Other Topics Concern   Not on file  Social History Narrative   Not  on file   Social Drivers of Health   Financial Resource Strain: Low Risk  (05/18/2022)   [facility] visits prior to 12/16/2022.   Overall Financial Resource Strain (CARDIA)    Difficulty of Paying Living Expenses: Not hard at all  Food Insecurity: Low Risk  (06/04/2023)   [facility]   Hunger Vital Sign    Worried About Running Out of Food in the Last Year: Never true    Ran Out of Food in the Last Year: Never true  Transportation Needs: Not on file (06/04/2023)   Physical Activity: Sufficiently Active (05/18/2022)   [facility] visits prior to 12/16/2022[facility] visits prior to 12/16/2022.   Exercise Vital Sign    Days of Exercise per Week: 5 days    Minutes of Exercise per Session: 30 min  Stress: No Stress Concern Present (05/18/2022)   [facility]16/[facility]22[facility] visits prior to 12/16/2022.   [employer] of Occupational Health - Occupational Stress Questionnaire    Feeling of Stress : Not at all  Social Connections: Socially Integrated (05/18/2022)   [facility] visits prior to 12/16/2022., [facility] visits prior to 12/16/2022.   Social Advertising account executive [NHANES]    Frequency of Communication with Friends and Family: More than three times a week    Frequency of Social Gatherings with Friends and Family: More than three times a week    Attends Religious Services: More than 4 times per year    Active Member of [employer] or Organizations: Yes    Attends Banker Meetings: More than 4 times per year    Marital Status: Married  Catering manager Violence: Not At Risk (05/18/2022)   [facility] prior to 12/16/2022., [facility] visits prior to 12/16/2022.   Humiliation, Afraid, Rape, and Kick questionnaire    Fear of Current or Ex-Partner: No    Emotionally Abused: No    Physically Abused: No    Sexually Abused: No   Family History  Problem Relation Age of Onset   Atrial fibrillation Father    Diabetes Mellitus II Neg Hx    ROS: All systems reviewed and negative except as per HPI.   Current Outpatient Medications  Medication Sig Dispense Refill   apixaban  (ELIQUIS ) 5 MG TABS tablet  Take 1 tablet (5 mg total) by mouth 2 (two) times daily. 60 tablet 11   carvedilol  (COREG ) 6.25 MG tablet TAKE 1 TABLET(6.25 MG) BY MOUTH TWICE DAILY WITH A MEAL 180 tablet 1   cholecalciferol (VITAMIN D3) 25 MCG (1000 UT) tablet Take 1,000 Units by mouth in the morning and at bedtime.      Cyanocobalamin (VITAMIN B12 PO) Take 1 tablet by mouth daily.      FOLIC ACID PO Take 1 tablet by mouth daily.     furosemide  (LASIX ) 20 MG tablet Take 40 mg alternating with 20 mg 135 tablet 3   magnesium  oxide (MAG-OX) 400 MG tablet Take 400 mg by mouth daily.     potassium chloride  (KLOR-CON ) 10 MEQ tablet TAKE 1 TABLET BY MOUTH DAILY WHEN YOU TAKE  FUROSEMIDE  45 tablet 3   Probiotic Product (PROBIOTIC DAILY PO) Take 1 capsule by mouth daily.     No current facility-administered medications for this encounter.   Wt Readings from Last 3 Encounters:  03/03/24 78.2 kg (172 lb 6.4 oz)  09/12/23 79.4 kg (175 lb)  05/08/23 76.3 kg (168 lb 3.2 oz)   BP 102/74   Pulse 98   Wt 78.2 kg (172 lb 6.4 oz)   SpO2 96%   BMI 32.57 kg/m   Physical Exam:   General: NAD Neck: No JVD, no thyromegaly or thyroid nodule.  Lungs: Clear to auscultation bilaterally with normal respiratory effort. CV: Nondisplaced PMI.  Heart regular S1/S2, no S3/S4, no murmur.  No peripheral edema.  No carotid bruit.  Normal pedal pulses.  Abdomen: Soft, nontender, no hepatosplenomegaly, no distention.  Skin: Intact without lesions or rashes.  Neurologic: Alert and oriented x 3.  Psych: Normal affect. Extremities: No clubbing or cyanosis.  HEENT: Normal.   Assessment/Plan:  1. Chronic systolic CHF: Nonischemic cardiomyopathy, LHC in 10/20 with no significant coronary disease.  Echo in 10/20 was suggestive of stress (Takotsubo-type) cardiomyopathy with apical ballooning (from severe urosepsis).  However, LV function did not improve on repeat echo in 12/20 or echo in 4/21 (EF still 25%).  ?LBBB cardiomyopathy.  She is now s/p Medtronic  CRT-D device. Repeat echo in 1/22 showed EF up to 50-55%, good response to CRT.  Echo 8/23 showed EF 50-55% and echo in 4/24 showed maintenance of improved function with LV EF 60-65%.  NYHA class II symptoms, not volume overloaded by exam or Optivol.  Medications limited by CKD stage IV.  - Continue Lasix  40 daily alternating with 20 daily, may be able to cut back if creatinine is higher.  Check BMET today.  - Continue Coreg  6.25 mg bid.  - No ACEI/ARNI/ARB with CKD stage 4 and improved EF.  - Off spironolactone  due to hyperkalemia.   - Off Jardiance  with UTIs. 2. CKD stage 4: S/p right nephrectomy in 10/20.  Sees nephrology at [facility]. Most recent creatinine 3.16. - She was turned down for renal transplant. - BMET today.  3. Atrial fibrillation: Paroxysmal.  Noted after device implantation.  No recent episodes.   - Continue apixaban  5 mg bid. CBC today.  - No recent AF, if becomes more frequent and impedes BiV pacing, consider ablation.  4. OSA: Patient snore and has significant fatigue.  - I will arrange for home sleep study.   Follow up in 6 months with APP.  I spent 31 minutes reviewing records, interviewing/examining patient, and managing orders.    Peder Bourdon, MD  03/03/2024  [facility] [place] [address] [facility] [place] [address] [phone] (office) [phone] (fax)

## 2024-03-03 NOTE — Progress Notes (Signed)
 Height:     Weight: BMI:  Today's Date:  STOP BANG RISK ASSESSMENT S (snore) Have you been told that you snore?     YES   T (tired) Are you often tired, fatigued, or sleepy during the day?   YES  O (obstruction) Do you stop breathing, choke, or gasp during sleep? YES   P (pressure) Do you have or are you being treated for high blood pressure? YES   B (BMI) Is your body index greater than 35 kg/m? NO   A (age) Are you 70 years old or older? YES   N (neck) Do you have a neck circumference greater than 16 inches?   NO   G (gender) Are you a female? NO   TOTAL STOP/BANG "YES" ANSWERS 5                                                                       For Office Use Only              Procedure Order Form    YES to 3+ Stop Bang questions OR two clinical symptoms - patient qualifies for WatchPAT (CPT 95800)      Clinical Notes: Will consult Sleep Specialist and refer for management of therapy due to patient increased risk of Sleep Apnea. Ordering a sleep study due to the following two clinical symptoms: Excessive daytime sleepiness G47.10 / Difficulty concentrating R41.840 / Memory problems or poor judgment G31.84 / Personality changes or irritability R45.4 / Loud snoring R06.83 / Unrefreshed by sleep G47.8 /History of high blood pressure R03.0 / Insomnia G47.00

## 2024-03-04 NOTE — Progress Notes (Signed)
 EPIC Encounter for ICM Monitoring  Patient Name: Jeanne Bruce is a 68 y.o. female Date: 03/04/2024 Primary Care Physican: Sheilah Denver., MD Primary Cardiologist: Mitzie Anda Electrophysiologist: Lawana Pray Bi-V Pacing: 97.9%        04/18/2023 Weight: 165 lbs 11/20/2023 Weight: 165 lbs   Time in AT/AF 0.0 hr/day (0.0%)                                              Transmission results review Office visit with Dr Mitzie Anda 5/19 and note states not volume overloaded by exam or Optivol.  Continue Lasix  40 mg daily alternating with 20 mg daily and may be able to cut back if creatinine is higher.   Diet: Not strict on salt intake.   Optivol thoracic impedance suggesting possible fluid accumulation starting 5/11 and trending back close to baseline.   Prescribed:  Furosemide  20 mg take 1 tablet by mouth every other day alternating with 40 mg every other day.   Potassium 10 mEq take 1 tablet by mouth on the days that Furosemide  40 mg is taken.   Labs: 03/03/2024 Creatinine 3.99, BUN 51, Potassium 3.4, Sodium 139, GFR 12 01/17/2024 Creatinine 3.16, BUN 54, Potassium 3.7, Sodium 139, GFR 15 08/21/2023 Creatinine 3.2    BUN 56, Potassium 3.9, Sodium 140, GFR 15 06/07/2023 Creatinine 2.86, BUN 69, Potassium 4.0, Sodium 137, GFR 17  05/21/2023 Creatinine 2.36, BUN 57, Potassium 4.2, Sodium 140, GFR 22  05/02/2023 Creatinine 3.3,   BUN 82, Potassium 3.6, Sodium 136, GFR 15 04/26/2023 Creatinine 3.39, BUN 56, Potassium 4.0, Sodium 138, GFR 14 A complete set of results can be found in Results Review.   Recommendations:   Any recommendations given at 5/19 OV with Dr Mitzie Anda.  Follow-up plan: ICM clinic phone appointment on 05/05/2024.   91 day device clinic remote transmission 03/31/2024.     EP/Cardiology Office Visits:  Recall 09/03/2024 with HF Clinic   Recall 03/17/2024 with Dr. Lawana Pray.       Copy of ICM check sent to Dr. Lawana Pray.  3 month ICM trend: 03/03/2024.    12-14 Month ICM trend:     [name], RN 03/04/2024 9:50 AM

## 2024-03-23 ENCOUNTER — Encounter (HOSPITAL_BASED_OUTPATIENT_CLINIC_OR_DEPARTMENT_OTHER): Payer: Self-pay

## 2024-03-23 ENCOUNTER — Emergency Department (HOSPITAL_BASED_OUTPATIENT_CLINIC_OR_DEPARTMENT_OTHER)
Admission: EM | Admit: 2024-03-23 | Discharge: 2024-03-23 | Disposition: A | Discharge status: Home / Self Care | Attending: Emergency Medicine | Admitting: Emergency Medicine

## 2024-03-23 DIAGNOSIS — N184 Chronic kidney disease, stage 4 (severe): Secondary | ICD-10-CM | POA: Insufficient documentation

## 2024-03-23 DIAGNOSIS — I480 Paroxysmal atrial fibrillation: Secondary | ICD-10-CM | POA: Diagnosis not present

## 2024-03-23 DIAGNOSIS — I509 Heart failure, unspecified: Secondary | ICD-10-CM | POA: Diagnosis not present

## 2024-03-23 DIAGNOSIS — R300 Dysuria: Secondary | ICD-10-CM | POA: Diagnosis not present

## 2024-03-23 DIAGNOSIS — Z7901 Long term (current) use of anticoagulants: Secondary | ICD-10-CM | POA: Insufficient documentation

## 2024-03-23 DIAGNOSIS — N898 Other specified noninflammatory disorders of vagina: Secondary | ICD-10-CM | POA: Diagnosis present

## 2024-03-23 DIAGNOSIS — Z85038 Personal history of other malignant neoplasm of large intestine: Secondary | ICD-10-CM | POA: Insufficient documentation

## 2024-03-23 LAB — URINALYSIS, ROUTINE W REFLEX MICROSCOPIC
Bilirubin Urine: NEGATIVE
Glucose, UA: NEGATIVE mg/dL
Ketones, ur: NEGATIVE mg/dL
Nitrite: NEGATIVE
Protein, ur: 100 mg/dL — AB
Specific Gravity, Urine: 1.015 (ref 1.005–1.030)
pH: 7 (ref 5.0–8.0)

## 2024-03-23 LAB — WET PREP, GENITAL
Clue Cells Wet Prep HPF POC: NONE SEEN
Sperm: NONE SEEN
Trich, Wet Prep: NONE SEEN
WBC, Wet Prep HPF POC: >=10 — AB (ref <10)
Yeast Wet Prep HPF POC: NONE SEEN

## 2024-03-23 LAB — URINALYSIS, MICROSCOPIC (REFLEX): WBC, UA: >50 WBC/hpf (ref 0–5)

## 2024-03-23 MED ORDER — CEFADROXIL 500 MG PO CAPS: 500.0000 mg | ORAL_CAPSULE | Freq: Two times a day (BID) | ORAL | 0 refills | Status: AC

## 2024-03-23 MED ORDER — ONDANSETRON 4 MG PO TBDP
8.0000 mg | ORAL_TABLET | Freq: Once | ORAL | Status: AC
  Administered 2024-03-23: 8 mg via ORAL
  Filled 2024-03-23: qty 2

## 2024-03-23 NOTE — ED Notes (Signed)
 Discharge instructions reviewed with patient. Patient verbalizes understanding, no further questions at this time. Medications/prescriptions and follow up information provided. No acute distress noted at time of departure.

## 2024-03-23 NOTE — ED Triage Notes (Signed)
 For the past week been c/o blood & pus in vaginal discharge, vaginal pain & pain with straight caths which is unusual. Today c/o vomiting & fatigue. Denies fever, abdominal or flank pain. States does urinate but straight caths to empty bladder.

## 2024-03-23 NOTE — ED Provider Notes (Signed)
 [facility] Provider Note   CSN: [id number] Arrival date & time: 03/23/24  0845     History  Chief Complaint  Patient presents with   Vaginal Discharge   Vaginal Pain    Jeanne Bruce is a 68 y.o. female.   Vaginal Discharge Vaginal Pain   68 year old female presents emergency department with complaints of vaginal discharge, bleeding, pain.  States that she has had symptoms for the past 6 to 7 days or so.  States that she has been dealing with the symptoms intermittently for the past year.  They typically last for a couple days before they resolve on their own.  States she has mentioned this to her gynecologist before in the past with no real diagnosis per patient.  Does state that she self caths due to urinary retention and is unsure whether or not the discharge is coming from her urethra or vaginally.  Patient does report some dysuria noted whenever she does urinate. States she has felt somewhat nauseated over the past week with 1-2 episodes of vomiting that happened after coughing episodes.  Denies any fevers, chills, change in bowel habits, abdominal pain.  Past medical history significant for CHF, multiple sclerosis, CKD 4, dilated cardiomyopathy, paroxysmal atrial fibrillation on Eliquis , V-fib, colon cancer  Home Medications Prior to Admission medications   Medication Sig Start Date End Date Taking? Authorizing Provider  apixaban  (ELIQUIS ) 5 MG TABS tablet Take 1 tablet (5 mg total) by mouth 2 (two) times daily. 06/22/23   Darlis Eisenmenger, MD  carvedilol  (COREG ) 6.25 MG tablet TAKE 1 TABLET(6.25 MG) BY MOUTH TWICE DAILY WITH A MEAL 01/04/24   McLean, Dalton S, MD  cholecalciferol (VITAMIN D3) 25 MCG (1000 UT) tablet Take 1,000 Units by mouth in the morning and at bedtime.     [provider]  Cyanocobalamin (VITAMIN B12 PO) Take 1 tablet by mouth daily.     [provider]  FOLIC ACID PO Take 1 tablet by mouth daily.     [provider]  furosemide  (LASIX ) 20 MG tablet Take 40 mg alternating with 20 mg 10/24/23   Diaz, Alma L, NP  magnesium  oxide (MAG-OX) 400 MG tablet Take 400 mg by mouth daily.    [provider]  potassium chloride  (KLOR-CON ) 10 MEQ tablet TAKE 1 TABLET BY MOUTH DAILY WHEN YOU TAKE FUROSEMIDE  07/27/23   Elmarie Hacking, FNP  Probiotic Product (PROBIOTIC DAILY PO) Take 1 capsule by mouth daily.    [provider]      Allergies    Ativan  [lorazepam ], Invanz [ertapenem], Mirabegron, Loratadine-pseudoephedrine er, and Penicillins    Review of Systems   Review of Systems  Genitourinary:  Positive for vaginal discharge and vaginal pain.  All other systems reviewed and are negative.   Physical Exam Updated Vital Signs BP 136/68 (BP Location: Right Arm)   Pulse 72   Temp 97.8 F (36.6 C) (Oral)   Resp 14   Ht 5\' 1"  (1.549 m)   Wt 77.1 kg   SpO2 100%   BMI 32.12 kg/m  Physical Exam Vitals and nursing note reviewed. Exam conducted with a chaperone present.  Constitutional:      General: She is not in acute distress.    Appearance: She is well-developed.  HENT:     Head: Normocephalic and atraumatic.  Eyes:     Conjunctiva/sclera: Conjunctivae normal.  Cardiovascular:     Rate and Rhythm: Normal rate and regular rhythm.  Heart sounds: No murmur heard. Pulmonary:     Effort: Pulmonary effort is normal. No respiratory distress.     Breath sounds: Normal breath sounds.  Abdominal:     Palpations: Abdomen is soft.     Tenderness: There is no abdominal tenderness. There is no guarding.  Genitourinary:    Comments: GU exam performed with female nursing staff at bedside.  External exam unremarkable.  Cervix appreciated had punctate erythematous lesions present.  Mucousy greenish yellow-colored vaginal discharge present.  No active bleeding present.  No active bleeding from the cervix. Musculoskeletal:        General: No swelling.     Cervical back:  Neck supple.  Skin:    General: Skin is warm and dry.     Capillary Refill: Capillary refill takes less than 2 seconds.  Neurological:     Mental Status: She is alert.  Psychiatric:        Mood and Affect: Mood normal.     ED Results / Procedures / Treatments   Labs (all labs ordered are listed, but only abnormal results are displayed) Labs Reviewed  WET PREP, GENITAL  URINALYSIS, ROUTINE W REFLEX MICROSCOPIC  GC/CHLAMYDIA PROBE AMP ([place]) NOT AT [facility]    EKG None  Radiology No results found.  Procedures Procedures    Medications Ordered in ED Medications - No data to display  ED Course/ Medical Decision Making/ A&P                                 Medical Decision Making Amount and/or Complexity of Data Reviewed Labs: ordered.  Risk Prescription drug management.   This patient presents to the ED for concern of vaginal discharge, bleeding, this involves an extensive number of treatment options, and is a complaint that carries with it a high risk of complications and morbidity.  The differential diagnosis includes gonorrhea, chlamydia, trichomoniasis, vulvovaginal candidiasis, PID, malignancy, other   Co morbidities that complicate the patient evaluation  See HPI   Additional history obtained:  Additional history obtained from EMR External records from outside source obtained and reviewed including hospital records   Lab Tests:  I Ordered, and personally interpreted labs.  The pertinent results include: UA large leukocytes, greater than 50 WBCs, many bacteria present, 6-10 RBCs.  UA also with large hemoglobin, 100 proteins.  Wet prep greater than 10 WBCs but with negative yeast, trichomoniasis, clue cells.  Gonorrhea/chlamydia pending.  Urine culture pending.   Imaging Studies ordered:  N/a   Cardiac Monitoring: / EKG:  N/a   Consultations Obtained:  N/a   Problem List / ED Course / Critical interventions / Medication  management  Vaginal discharge, dysuria Reevaluation of the patient showed that the patient stayed the same I have reviewed the patients home medicines and have made adjustments as needed   Social Determinants of Health:  Denies tobacco, ilicit drug use.   Test / Admission - Considered:  Vaginal discharge, dysuria Vitals signs within normal range and stable throughout visit. Laboratory/imaging studies significant for: See above 68 year old female presents emergency department with complaints of vaginal discharge, bleeding, pain.  States that she has had symptoms for the past 6 to 7 days or so.  States that she has been dealing with the symptoms intermittently for the past year.  They typically last for a couple days before they resolve on their own.  States she has mentioned this to her gynecologist  before in the past with no real diagnosis per patient.  Does state that she self caths due to urinary retention and is unsure whether or not the discharge is coming from her urethra or vaginally.  Patient does report some dysuria noted whenever she does urinate.  States she has felt somewhat nauseated over the past week with 1-2 episodes of vomiting that happened after coughing episodes.  Denies any fevers, chills, change in bowel habits, abdominal pain. On exam, no appreciable abdominal tenderness.  Vaginal exam externally was unremarkable but with mucousy green/yellow-colored vaginal discharge without active vaginal bleeding present.  Workup today overall reassuring.  Wet prep negative.  Gonorrhea/Candida swab pending.  UA concerning for infection with leukocyte positive, RBCs, WBCs and bacteria present.  Her most recent urine culture from [facility] on 06/15/2023, culture was grossly pansensitive.  Will begin Duricef.  Patient's vaginal discharge seems to be intermittently chronic over the past year or so.  Will follow gonorrhea/chlamydia testing and have her follow-up with gynecology in the  outpatient setting as she already has established care.  Treatment plan discussed with patient and she knowledge understanding was agreeable to said plan.  Patient well-appearing, afebrile in no acute distress. Worrisome signs and symptoms were discussed with the patient, and the patient acknowledged understanding to return to the ED if noticed. Patient was stable upon discharge.          Final Clinical Impression(s) / ED Diagnoses Final diagnoses:  None    Rx / DC Orders ED Discharge Orders     None         Mellen Butter, [place] 03/23/24 1724    Tegeler, Marine Sia, MD 03/24/24 [phone]

## 2024-03-23 NOTE — Discharge Instructions (Addendum)
 As discussed, it does appear like you have a UTI.  Will put you on antibiotics for this.  Recommend follow-up with your OB/GYN for further assessment regarding your vaginal discharge.  You tested negative for trichomoniasis, BV, yeast today.  Please do not hesitate to return to the emergency department if the worrisome signs and symptoms we discussed become apparent.

## 2024-03-23 NOTE — ED Notes (Signed)
Pt provided with water and saltines for PO challenge. 

## 2024-03-23 NOTE — ED Notes (Signed)
Called lab to add on urine culture to previously collected urine 

## 2024-03-24 LAB — GC/CHLAMYDIA PROBE AMP (~~LOC~~) NOT AT ARMC
Chlamydia: NEGATIVE
Comment: NEGATIVE
Comment: NORMAL
Neisseria Gonorrhea: NEGATIVE

## 2024-03-25 LAB — URINE CULTURE: Culture: 40000 — AB

## 2024-03-26 ENCOUNTER — Telehealth (HOSPITAL_BASED_OUTPATIENT_CLINIC_OR_DEPARTMENT_OTHER): Payer: Self-pay | Admitting: *Deleted

## 2024-03-26 ENCOUNTER — Telehealth (HOSPITAL_COMMUNITY): Payer: Self-pay

## 2024-03-26 NOTE — Telephone Encounter (Signed)
 Post ED Visit - Positive Culture Follow-up  Culture report reviewed by antimicrobial stewardship pharmacist: [facility] [x]  Barbra Boone, [place].D. []  Skeet Duke, Pharm.D., BCPS AQ-ID []  Leslee Rase, Pharm.D., BCPS []  Garland Junk, [address].D., BCPS []  [place], [address].D., BCPS, AAHIVP []  Alcide Aly, Pharm.D., BCPS, AAHIVP []  Jerri Morale, PharmD, BCPS []  Graham Laws, PharmD, BCPS []  Cleda Curly, PharmD, BCPS []  Tamar Fairly, PharmD []  Ballard Levels, PharmD, BCPS []  Ollen Beverage, PharmD  [facility] []  Arlyne Bering, PharmD []  Sherryle Don, PharmD []  [place]elin[place], PharmD []  Delila Felty, Rph []  [place]) Cleora Daft, PharmD []  Augustina Block, PharmD []  Arie Kurtz, PharmD []  Sharlyn Deaner, PharmD []  Agnes Hose, PharmD []  Kendall Pauls, PharmD []  Gladstone Lamer, PharmD []  Armanda Bern, PharmD []  Tera Fellows, PharmD   Positive urine culture Treated with cefadroxil, organism sensitive to the same and no further patient follow-up is required at this time.  Zeb Heys 03/26/2024, 10:42 AM

## 2024-03-26 NOTE — Telephone Encounter (Signed)
 Left message for patient to  to tell her she can complete Itamar  sleep study.phone number provided for any questions

## 2024-03-28 ENCOUNTER — Encounter: Payer: Self-pay | Admitting: Cardiology

## 2024-03-31 ENCOUNTER — Ambulatory Visit (INDEPENDENT_AMBULATORY_CARE_PROVIDER_SITE_OTHER): Payer: Medicare HMO

## 2024-03-31 DIAGNOSIS — I420 Dilated cardiomyopathy: Secondary | ICD-10-CM

## 2024-03-31 DIAGNOSIS — I5022 Chronic systolic (congestive) heart failure: Secondary | ICD-10-CM

## 2024-03-31 LAB — CUP PACEART REMOTE DEVICE CHECK
Battery Remaining Longevity: 71 mo
Battery Voltage: 2.98 V
Brady Statistic AP VP Percent: 0.01 %
Brady Statistic AP VS Percent: 0 %
Brady Statistic AS VP Percent: 97.97 %
Brady Statistic AS VS Percent: 2.01 %
Brady Statistic RA Percent Paced: 0.01 %
Brady Statistic RV Percent Paced: 4.79 %
Date Time Interrogation Session: 20250616043724
HighPow Impedance: 83 Ohm
Implantable Lead Connection Status: 753985
Implantable Lead Connection Status: 753985
Implantable Lead Connection Status: 753985
Implantable Lead Implant Date: 20210621
Implantable Lead Implant Date: 20210621
Implantable Lead Implant Date: 20210621
Implantable Lead Location: 753858
Implantable Lead Location: 753859
Implantable Lead Location: 753860
Implantable Lead Model: 4398
Implantable Lead Model: 5076
Implantable Pulse Generator Implant Date: 20210621
Lead Channel Impedance Value: 152 Ohm
Lead Channel Impedance Value: 156.606
Lead Channel Impedance Value: 160.941
Lead Channel Impedance Value: 160.941
Lead Channel Impedance Value: 166.114
Lead Channel Impedance Value: 304 Ohm
Lead Channel Impedance Value: 304 Ohm
Lead Channel Impedance Value: 304 Ohm
Lead Channel Impedance Value: 323 Ohm
Lead Channel Impedance Value: 342 Ohm
Lead Channel Impedance Value: 342 Ohm
Lead Channel Impedance Value: 342 Ohm
Lead Channel Impedance Value: 380 Ohm
Lead Channel Impedance Value: 513 Ohm
Lead Channel Impedance Value: 532 Ohm
Lead Channel Impedance Value: 570 Ohm
Lead Channel Impedance Value: 570 Ohm
Lead Channel Impedance Value: 589 Ohm
Lead Channel Pacing Threshold Amplitude: 0.5 V
Lead Channel Pacing Threshold Amplitude: 0.875 V
Lead Channel Pacing Threshold Pulse Width: 0.4 ms
Lead Channel Pacing Threshold Pulse Width: 0.4 ms
Lead Channel Sensing Intrinsic Amplitude: 10.375 mV
Lead Channel Sensing Intrinsic Amplitude: 10.375 mV
Lead Channel Sensing Intrinsic Amplitude: 2.25 mV
Lead Channel Sensing Intrinsic Amplitude: 2.25 mV
Lead Channel Setting Pacing Amplitude: 1.5 V
Lead Channel Setting Pacing Amplitude: 2 V
Lead Channel Setting Pacing Amplitude: 2.5 V
Lead Channel Setting Pacing Pulse Width: 0.4 ms
Lead Channel Setting Pacing Pulse Width: 0.4 ms
Lead Channel Setting Sensing Sensitivity: 0.3 mV
Zone Setting Status: 755011
Zone Setting Status: 755011

## 2024-04-03 ENCOUNTER — Ambulatory Visit: Payer: Self-pay | Admitting: Cardiology

## 2024-05-05 ENCOUNTER — Ambulatory Visit: Attending: Cardiology

## 2024-05-05 DIAGNOSIS — Z95810 Presence of automatic (implantable) cardiac defibrillator: Secondary | ICD-10-CM

## 2024-05-05 DIAGNOSIS — I5022 Chronic systolic (congestive) heart failure: Secondary | ICD-10-CM

## 2024-05-08 NOTE — Progress Notes (Signed)
 EPIC Encounter for ICM Monitoring  Patient Name: Jeanne Bruce is a 68 y.o. female Date: 05/08/2024 Primary Care Physican: Lelon Norleen ORN., MD Primary Cardiologist: Rolan Electrophysiologist: Inocencio Bi-V Pacing: 97.8%        04/18/2023 Weight: 165 lbs 11/20/2023 Weight: 165 lbs 03/03/2024 Office Weight: 172 lbs   Time in AT/AF <0.1 hr/day (<0.1%)                                           Attempted call to patient and unable to reach.   Transmission results reviewed.    Diet: Not strict on salt intake.   Optivol thoracic impedance suggesting possible fluid accumulation starting 6/20-7/7 and 7/157/20.  Returned to baseline 7/21.   Prescribed:  Furosemide  20 mg take 1 tablet by mouth every other day alternating with 40 mg every other day.   Potassium 10 mEq take 1 tablet by mouth on the days that Furosemide  40 mg is taken.   Labs: 03/03/2024 Creatinine 3.99, BUN 51, Potassium 3.4, Sodium 139, GFR 12 01/17/2024 Creatinine 3.16, BUN 54, Potassium 3.7, Sodium 139, GFR 15 08/21/2023 Creatinine 3.2    BUN 56, Potassium 3.9, Sodium 140, GFR 15 06/07/2023 Creatinine 2.86, BUN 69, Potassium 4.0, Sodium 137, GFR 17  05/21/2023 Creatinine 2.36, BUN 57, Potassium 4.2, Sodium 140, GFR 22  05/02/2023 Creatinine 3.3,   BUN 82, Potassium 3.6, Sodium 136, GFR 15 04/26/2023 Creatinine 3.39, BUN 56, Potassium 4.0, Sodium 138, GFR 14 A complete set of results can be found in Results Review.   Recommendations:   Unable to reach.     Follow-up plan: ICM clinic phone appointment on 05/05/2024.   91 day device clinic remote transmission 9/15//2025.     EP/Cardiology Office Visits:  09/03/2024 with HF Clinic   Recall 03/17/2024 with Dr. Inocencio.       Copy of ICM check sent to Dr. Inocencio.  3 month ICM trend: 05/05/2024.    12-14 Month ICM trend:     Mitzie GORMAN Garner, RN 05/08/2024 2:11 PM

## 2024-05-08 NOTE — Progress Notes (Signed)
 Remote ICD transmission.

## 2024-06-09 ENCOUNTER — Ambulatory Visit: Attending: Cardiology

## 2024-06-09 DIAGNOSIS — Z95810 Presence of automatic (implantable) cardiac defibrillator: Secondary | ICD-10-CM

## 2024-06-09 DIAGNOSIS — I5022 Chronic systolic (congestive) heart failure: Secondary | ICD-10-CM | POA: Diagnosis not present

## 2024-06-12 ENCOUNTER — Telehealth: Payer: Self-pay

## 2024-06-12 NOTE — Progress Notes (Signed)
 EPIC Encounter for ICM Monitoring  Patient Name: Jeanne Bruce is a 68 y.o. female Date: 06/12/2024 Primary Care Physican: Lelon Norleen ORN., MD Primary Cardiologist: Rolan Electrophysiologist: Inocencio Bi-V Pacing: 97.6%        04/18/2023 Weight: 165 lbs 11/20/2023 Weight: 165 lbs 03/03/2024 Office Weight: 172 lbs   Time in AT/AF 0.0 hr/day (0.0%)                                       Attempted call to patient and unable to reach.   Transmission results reviewed.    Diet: Not strict on salt intake.   Optivol thoracic impedance suggesting intermittent days with possible fluid accumulation within the last month.   Prescribed:  Furosemide  20 mg take 1 tablet by mouth every other day alternating with 40 mg every other day.   Potassium 10 mEq take 1 tablet by mouth on the days that Furosemide  40 mg is taken.   Labs: 03/03/2024 Creatinine 3.99, BUN 51, Potassium 3.4, Sodium 139, GFR 12 01/17/2024 Creatinine 3.16, BUN 54, Potassium 3.7, Sodium 139, GFR 15 08/21/2023 Creatinine 3.2    BUN 56, Potassium 3.9, Sodium 140, GFR 15 06/07/2023 Creatinine 2.86, BUN 69, Potassium 4.0, Sodium 137, GFR 17  05/21/2023 Creatinine 2.36, BUN 57, Potassium 4.2, Sodium 140, GFR 22  05/02/2023 Creatinine 3.3,   BUN 82, Potassium 3.6, Sodium 136, GFR 15 04/26/2023 Creatinine 3.39, BUN 56, Potassium 4.0, Sodium 138, GFR 14 A complete set of results can be found in Results Review.   Recommendations:   Unable to reach.     Follow-up plan: ICM clinic phone appointment on 07/14/2024.   91 day device clinic remote transmission 06/30/2024.     EP/Cardiology Office Visits:  09/03/2024 with HF Clinic   Recall 03/17/2024 with Dr. Inocencio.       Copy of ICM check sent to Dr. Inocencio.  3 month ICM trend: 06/09/2024.    12-14 Month ICM trend:     Mitzie GORMAN Garner, RN 06/12/2024 2:49 PM

## 2024-06-12 NOTE — Telephone Encounter (Signed)
 Remote ICM transmission received.  Attempted call to patient regarding ICM remote transmission and no answer.

## 2024-06-23 ENCOUNTER — Other Ambulatory Visit (HOSPITAL_COMMUNITY): Payer: Self-pay | Admitting: Cardiology

## 2024-06-30 ENCOUNTER — Ambulatory Visit (INDEPENDENT_AMBULATORY_CARE_PROVIDER_SITE_OTHER): Payer: Medicare HMO

## 2024-06-30 DIAGNOSIS — I5022 Chronic systolic (congestive) heart failure: Secondary | ICD-10-CM

## 2024-07-01 LAB — CUP PACEART REMOTE DEVICE CHECK
Battery Remaining Longevity: 69 mo
Battery Voltage: 2.97 V
Brady Statistic AP VP Percent: 0.04 %
Brady Statistic AP VS Percent: 0 %
Brady Statistic AS VP Percent: 98.22 %
Brady Statistic AS VS Percent: 1.74 %
Brady Statistic RA Percent Paced: 0.04 %
Brady Statistic RV Percent Paced: 5.33 %
Date Time Interrogation Session: 20250915022823
HighPow Impedance: 79 Ohm
Implantable Lead Connection Status: 753985
Implantable Lead Connection Status: 753985
Implantable Lead Connection Status: 753985
Implantable Lead Implant Date: 20210621
Implantable Lead Implant Date: 20210621
Implantable Lead Implant Date: 20210621
Implantable Lead Location: 753858
Implantable Lead Location: 753859
Implantable Lead Location: 753860
Implantable Lead Model: 4398
Implantable Lead Model: 5076
Implantable Pulse Generator Implant Date: 20210621
Lead Channel Impedance Value: 166.114
Lead Channel Impedance Value: 174.595
Lead Channel Impedance Value: 174.595
Lead Channel Impedance Value: 180 Ohm
Lead Channel Impedance Value: 190 Ohm
Lead Channel Impedance Value: 304 Ohm
Lead Channel Impedance Value: 323 Ohm
Lead Channel Impedance Value: 323 Ohm
Lead Channel Impedance Value: 342 Ohm
Lead Channel Impedance Value: 380 Ohm
Lead Channel Impedance Value: 380 Ohm
Lead Channel Impedance Value: 380 Ohm
Lead Channel Impedance Value: 399 Ohm
Lead Channel Impedance Value: 570 Ohm
Lead Channel Impedance Value: 570 Ohm
Lead Channel Impedance Value: 627 Ohm
Lead Channel Impedance Value: 627 Ohm
Lead Channel Impedance Value: 646 Ohm
Lead Channel Pacing Threshold Amplitude: 0.5 V
Lead Channel Pacing Threshold Amplitude: 0.75 V
Lead Channel Pacing Threshold Pulse Width: 0.4 ms
Lead Channel Pacing Threshold Pulse Width: 0.4 ms
Lead Channel Sensing Intrinsic Amplitude: 10.625 mV
Lead Channel Sensing Intrinsic Amplitude: 10.625 mV
Lead Channel Sensing Intrinsic Amplitude: 2.375 mV
Lead Channel Sensing Intrinsic Amplitude: 2.375 mV
Lead Channel Setting Pacing Amplitude: 1.5 V
Lead Channel Setting Pacing Amplitude: 2 V
Lead Channel Setting Pacing Amplitude: 2.5 V
Lead Channel Setting Pacing Pulse Width: 0.4 ms
Lead Channel Setting Pacing Pulse Width: 0.4 ms
Lead Channel Setting Sensing Sensitivity: 0.3 mV
Zone Setting Status: 755011
Zone Setting Status: 755011

## 2024-07-02 ENCOUNTER — Encounter (HOSPITAL_COMMUNITY): Payer: Self-pay | Admitting: Cardiology

## 2024-07-02 ENCOUNTER — Other Ambulatory Visit (HOSPITAL_COMMUNITY): Payer: Self-pay

## 2024-07-02 DIAGNOSIS — I5022 Chronic systolic (congestive) heart failure: Secondary | ICD-10-CM

## 2024-07-02 MED ORDER — FUROSEMIDE 20 MG PO TABS: ORAL_TABLET | ORAL | 3 refills | Status: AC

## 2024-07-07 ENCOUNTER — Ambulatory Visit: Payer: Self-pay | Admitting: Cardiology

## 2024-07-07 NOTE — Progress Notes (Signed)
Remote ICD Transmission.

## 2024-07-08 ENCOUNTER — Other Ambulatory Visit (HOSPITAL_COMMUNITY): Payer: Self-pay | Admitting: Cardiology

## 2024-07-14 ENCOUNTER — Ambulatory Visit: Attending: Cardiology

## 2024-07-14 DIAGNOSIS — Z95810 Presence of automatic (implantable) cardiac defibrillator: Secondary | ICD-10-CM

## 2024-07-14 DIAGNOSIS — I5022 Chronic systolic (congestive) heart failure: Secondary | ICD-10-CM

## 2024-07-17 ENCOUNTER — Telehealth: Payer: Self-pay

## 2024-07-17 NOTE — Telephone Encounter (Signed)
 Remote ICM transmission received.  Attempted call to patient regarding ICM remote transmission and no answer.

## 2024-07-17 NOTE — Progress Notes (Signed)
 EPIC Encounter for ICM Monitoring  Patient Name: Jeanne Bruce is a 68 y.o. female Date: 07/17/2024 Primary Care Physican: Lelon Norleen ORN., MD Primary Cardiologist: Rolan Electrophysiologist: Inocencio Bi-V Pacing: 98.2%        04/18/2023 Weight: 165 lbs 11/20/2023 Weight: 165 lbs 03/03/2024 Office Weight: 172 lbs   Time in AT/AF 0.0 hr/day (0.0%)                                       Attempted call to patient and unable to reach. Transmission results reviewed.     Diet: Not strict on salt intake.   Since 06/09/2024 ICM Remote Transmission: Optivol thoracic impedance suggesting normal fluid levels.   Prescribed:  Furosemide  20 mg take 1 tablet by mouth every other day alternating with 40 mg every other day.   Potassium 10 mEq take 1 tablet by mouth on the days that Furosemide  40 mg is taken.   Labs: 03/03/2024 Creatinine 3.99, BUN 51, Potassium 3.4, Sodium 139, GFR 12 01/17/2024 Creatinine 3.16, BUN 54, Potassium 3.7, Sodium 139, GFR 15 08/21/2023 Creatinine 3.2    BUN 56, Potassium 3.9, Sodium 140, GFR 15 06/07/2023 Creatinine 2.86, BUN 69, Potassium 4.0, Sodium 137, GFR 17  05/21/2023 Creatinine 2.36, BUN 57, Potassium 4.2, Sodium 140, GFR 22  05/02/2023 Creatinine 3.3,   BUN 82, Potassium 3.6, Sodium 136, GFR 15 04/26/2023 Creatinine 3.39, BUN 56, Potassium 4.0, Sodium 138, GFR 14 A complete set of results can be found in Results Review.   Recommendations:   Unable to reach.      Follow-up plan: ICM clinic phone appointment on 08/25/2024.   91 day device clinic remote transmission 09/29/2024.     EP/Cardiology Office Visits:  09/03/2024 with HF Clinic   Recall 03/17/2024 with Dr. Inocencio (Yearly).       Copy of ICM check sent to Dr. Inocencio.   Remote monitoring is medically necessary for Heart Failure Management.    90 day Daily Thoracic Impedance ICM trend: 04/14/2024 through 07/14/2024.    12-14 Month Thoracic Impedance ICM trend:     Jeanne GORMAN Garner, RN 07/17/2024 8:44  AM

## 2024-08-25 ENCOUNTER — Telehealth: Payer: Self-pay

## 2024-08-25 ENCOUNTER — Ambulatory Visit: Attending: Cardiology

## 2024-08-25 DIAGNOSIS — Z95810 Presence of automatic (implantable) cardiac defibrillator: Secondary | ICD-10-CM | POA: Diagnosis not present

## 2024-08-25 DIAGNOSIS — I5022 Chronic systolic (congestive) heart failure: Secondary | ICD-10-CM | POA: Diagnosis not present

## 2024-08-25 NOTE — Progress Notes (Signed)
 EPIC Encounter for ICM Monitoring  Patient Name: Jeanne Bruce is a 68 y.o. female Date: 08/25/2024 Primary Care Physican: Lelon Norleen ORN., MD Primary Cardiologist: Rolan Electrophysiologist: Inocencio Bi-V Pacing: 98.2%        04/18/2023 Weight: 165 lbs 11/20/2023 Weight: 165 lbs 03/03/2024 Office Weight: 172 lbs  Since 14-Jul-2024  Time in AT/AF  <0.1 hr/day (<0.1%)                                    Attempted call to patient and unable to reach. Transmission results reviewed.     Diet: Not strict on salt intake.   Since 07/14/2024 ICM Remote Transmission: Optivol thoracic impedance suggesting possible fluid accumulation starting 08/15/2024.  Also suggesting possible fluid accumulation from 07/24/2024-08/09/2024.   Prescribed:  Furosemide  20 mg take 1 tablet by mouth every other day alternating with 40 mg every other day.   Potassium 10 mEq take 1 tablet by mouth on the days that Furosemide  40 mg is taken.   Labs: 08/18/2024 Creatinine 3.7,   BUN 59, Potassium 4.3, Sodium 137, GFR 13 07/22/2024 Creatinine 3.85, BUN 68, Potassium 4.3, Sodium 137, GFR 12 03/03/2024 Creatinine 3.99, BUN 51, Potassium 3.4, Sodium 139, GFR 12 01/17/2024 Creatinine 3.16, BUN 54, Potassium 3.7, Sodium 139, GFR 15 08/21/2023 Creatinine 3.2    BUN 56, Potassium 3.9, Sodium 140, GFR 15 06/07/2023 Creatinine 2.86, BUN 69, Potassium 4.0, Sodium 137, GFR 17  05/21/2023 Creatinine 2.36, BUN 57, Potassium 4.2, Sodium 140, GFR 22  05/02/2023 Creatinine 3.3,   BUN 82, Potassium 3.6, Sodium 136, GFR 15 04/26/2023 Creatinine 3.39, BUN 56, Potassium 4.0, Sodium 138, GFR 14 A complete set of results can be found in Results Review.   Recommendations:   Unable to reach.   Will send to HF clinic if patient is reached.    Follow-up plan: ICM clinic phone appointment on 09/08/2024 to recheck fluid levels (will be checked at 09/03/2024 OV).   91 day device clinic remote transmission 09/29/2024.     EP/Cardiology Office Visits:   09/03/2024 with HF Clinic   Recall 03/17/2024 with Dr. Inocencio (Yearly).       Copy of ICM check sent to Dr. Inocencio.   Remote monitoring is medically necessary for Heart Failure Management.    Daily Thoracic Impedance ICM trend: 05/26/2024 through 08/25/2024.    12-14 Month Thoracic Impedance ICM trend:     Mitzie GORMAN Garner, RN 08/25/2024 3:12 PM

## 2024-08-25 NOTE — Telephone Encounter (Signed)
 Remote ICM transmission received.  Attempted call to patient regarding ICM remote transmission and no answer.

## 2024-08-27 ENCOUNTER — Encounter (HOSPITAL_COMMUNITY): Payer: Self-pay | Admitting: *Deleted

## 2024-09-03 ENCOUNTER — Encounter (HOSPITAL_COMMUNITY)

## 2024-09-08 ENCOUNTER — Ambulatory Visit: Attending: Cardiology

## 2024-09-08 DIAGNOSIS — I5022 Chronic systolic (congestive) heart failure: Secondary | ICD-10-CM

## 2024-09-08 DIAGNOSIS — Z95810 Presence of automatic (implantable) cardiac defibrillator: Secondary | ICD-10-CM

## 2024-09-08 NOTE — Progress Notes (Signed)
 EPIC Encounter for ICM Monitoring  Patient Name: Jeanne Bruce is a 68 y.o. female Date: 09/08/2024 Primary Care Physican: Lelon Norleen ORN., MD Primary Cardiologist: Rolan Electrophysiologist: Inocencio Bi-V Pacing: 97.8%        04/18/2023 Weight: 165 lbs 11/20/2023 Weight: 165 lbs 03/03/2024 Office Weight: 172 lbs 08/18/2024 Office Weight: 166 lbs (care everywhere)   Since 25-Aug-2024 Time in AT/AF  <0.1 hr/day (<0.1%)                                    Transmission results reviewed.     Diet: Not strict on salt intake.   Since 08/25/2024 ICM Remote Transmission: Optivol thoracic impedance suggesting fluid levels returned to normal.   Prescribed:  Furosemide  20 mg take 1 tablet by mouth every other day alternating with 40 mg every other day.   Potassium 10 mEq take 1 tablet by mouth on the days that Furosemide  40 mg is taken.   Labs: 08/18/2024 Creatinine 3.7,   BUN 59, Potassium 4.3, Sodium 137, GFR 13 07/22/2024 Creatinine 3.85, BUN 68, Potassium 4.3, Sodium 137, GFR 12 03/03/2024 Creatinine 3.99, BUN 51, Potassium 3.4, Sodium 139, GFR 12 01/17/2024 Creatinine 3.16, BUN 54, Potassium 3.7, Sodium 139, GFR 15 08/21/2023 Creatinine 3.2    BUN 56, Potassium 3.9, Sodium 140, GFR 15 06/07/2023 Creatinine 2.86, BUN 69, Potassium 4.0, Sodium 137, GFR 17  05/21/2023 Creatinine 2.36, BUN 57, Potassium 4.2, Sodium 140, GFR 22  05/02/2023 Creatinine 3.3,   BUN 82, Potassium 3.6, Sodium 136, GFR 15 04/26/2023 Creatinine 3.39, BUN 56, Potassium 4.0, Sodium 138, GFR 14 A complete set of results can be found in Results Review.   Recommendations:  No changes.   Follow-up plan: ICM clinic phone appointment on 10/13/2024.   91 day device clinic remote transmission 09/29/2024.     EP/Cardiology Office Visits:  09/09/2024 with HF Clinic   Recall 03/17/2024 with Dr. Inocencio (Yearly).       Copy of ICM check sent to Dr. Inocencio.   Remote monitoring is medically necessary for Heart Failure Management.     Daily Thoracic Impedance ICM trend: 06/09/2024 through 09/08/2024.    12-14 Month Thoracic Impedance ICM trend:     Mitzie GORMAN Garner, RN 09/08/2024 2:38 PM

## 2024-09-09 ENCOUNTER — Telehealth (HOSPITAL_COMMUNITY): Payer: Self-pay

## 2024-09-09 ENCOUNTER — Ambulatory Visit (HOSPITAL_COMMUNITY)

## 2024-09-09 NOTE — Progress Notes (Incomplete)
 ADVANCED HF CLINIC NOTE    Date:  09/09/2024  PCP:  Lelon Norleen ORN., MD  Cardiologist:  Wilbert Bihari, MD HF Cardioligist: Dr. Rolan  Chief complaint: CHF  HPI: Jeanne Bruce is a 68 y.o. female with history of CKD stage 4, chronic systolic CHF, multiple sclerosis, and right nephrectomy.  Patient also had remote colon cancer with colostomy.  She has had frequent UTIs and was admitted in 10/20 with urosepsis.  She had respiratory arrest and VF arrest in the ER and was intubated.  Echo showed EF 25% with apical ballooning.  LHC showed no significant coronary disease.  She ended up having right nephrectomy for renal abscess in 10/20.    Echo 12/20: EF still low at 25-30% with peri-apical akinesis.   She has had a hard time taking cardiac medications due to orthostatic symptoms with low BP.  She was sent home from the hospital in 10/20 on hydralazine /Imdur  but had to stop due to low BP.    Echo 4/21 showed EF 25% with septal-lateral dyssynchrony, normal RV.  Medtronic CRT-D device was placed in 6/21.  Repeat echo 1/22: EF up to 50-55% with normal RV.   Echo 8/23 showed EF 50-55%, normal RV, normal IVC.  Echo in 4/24 showed EF 60-65%, normal RV, mild-moderate TR.   5/25:Today she returns for followup of CHF.  Patient reports generalized fatigue.  She is no short of breath with usual activities.  No chest pain.  No palpitations or lightheadedness.  She snores at night.    She returns today for heart failure follow up. Overall feeling ***. NYHA ***. Reports {Symptoms; cardiac:12860::dyspnea,fatigue}. Denies {Symptoms; cardiac:12860::chest pain,dyspnea,fatigue,near-syncope,orthopnea,palpitations,dizziness,abnormal bleeding}. Able to perform ADLs. Appetite okay. Weight at home ***. BP at home***. Compliant with all medications.   ECG (personally reviewed): NSR, BiV pacing  MDT device interrogation: 98% BiV paced, stable thoracic impedance, no AF/VT.    Labs (11/20):  creatinine 1.5 Labs (12/20): creatinine 2.47 Labs (1/21): creatinine 2.1 => 2.04, BNP 54 Labs (6/21): creatinine 2.33 Labs (7/21): K 5.7, creatinine 2.61 Labs (9/21): K 4.5, creatinine 2.38 Labs (4/22): K 5.5, creatinine 2.5, LDL 91 Labs (8/23): K 3.5, creatinine 2.5, hgb 11.6, LDL 97 Labs (1/24): K 3.5, creatinine 2.7 Labs (7/24): K 3.6, creatinine 3.3 Labs (11/24): K 3.9, SCr 3.2 Labs (4/25): LDL 84, K 3.7, creatinine 6.83  PMH: 1. H/o right nephrectomy for renal abscess in 10/20.  2. Multiple Sclerosis 3. HTN 4. H/o colon cancer: s/p colectomy with colostomy.  5. CKD: Stage 4. Sees a nephrologist at [facility].  6. Frequent UTIs.  7. Chronic systolic CHF: Nonischemic cardiomyopathy. Medtronic CRT-D device.  - Echo (10/20): EF 25%, apical ballooning.  - LHC (10/20): Normal coronaries.  - Echo (12/20): EF 25-30%, peri-apical akinesis, normal RV size and systolic function (similar to 89/79 echo).   - Echo (4/21): EF 25%, septal-lateral dyssynchrony - Echo (1/22): EF 50-55%, normal RV.  - Echo (8/23): EF 50-55%, normal RV, normal IVC.  - Echo (4/24): EF 60-65%, normal RV, mild-moderate TR. 8. LBBB 9. Atrial fibrillation: Paroxysmal.   Social History   Socioeconomic History   Marital status: Married    Spouse name: Not on file   Number of children: Not on file   Years of education: Not on file   Highest education level: Not on file  Occupational History   Not on file  Tobacco Use   Smoking status: Never   Smokeless tobacco: Never  Vaping Use   Vaping status: Never Used  Substance and Sexual Activity   Alcohol use: Not Currently   Drug use: Never   Sexual activity: Not on file  Other Topics Concern   Not on file  Social History Narrative   Not on file   Social Drivers of Health   Financial Resource Strain: Low Risk  (05/18/2022)   [facility] prior to [facility]16[facility]   Overall Financial Resource Strain (CARDIA)     Difficulty of Paying Living Expenses: Not hard at all  Food Insecurity: Low Risk  (07/15/2024)   Received from [facility]   Hunger Vital Sign    Within the past 12 months, you worried that your food would run out before you got money to buy more: Never true    Within the past 12 months, the food you bought just didn't last and you didn't have money to get more. : Never true  Transportation Needs: No Transportation Needs (07/15/2024)   Received from [employer]    In the past 12 months, has lack of reliable transportation kept you from medical appointments, meetings, work or from getting things needed for daily living? : No  Physical Activity: Sufficiently Active (05/18/2022)   [facility] visits prior to 12/16/2022., [facility]   Exercise Vital Sign    On average, how many days per week do you engage in moderate to strenuous exercise (like a brisk walk)?: 5 days    On average, how many minutes do you engage in exercise at this level?: 30 min  Stress: No Stress Concern Present (05/18/2022)   Received from [facility] visits prior to 12/16/2022., [facility]   [employer] of Occupational Health - Occupational Stress Questionnaire    Feeling of Stress : Not at all  Social Connections: Socially Integrated (05/18/2022)   [facility] prior to 12/16/2022., [facility]   Social Connection and Isolation Panel    In a typical week, how many times do you talk on the phone with family, friends, or neighbors?: More than three times a week    How often do you get together with friends or relatives?: More than three times a week    How often do you attend church or religious services?: More than 4 times per year    Do you belong to any clubs or organizations such as church groups, unions, fraternal or athletic groups, or school groups?: Yes    How often do you attend meetings of the  clubs or organizations you belong to?: More than 4 times per year    Are you married, widowed, divorced, separated, never married, or living with a partner?: Married  Intimate Partner Violence: Not At Risk (05/18/2022)   Received from [facility] visits prior to 12/16/2022.   Humiliation, Afraid, Rape, and Kick questionnaire    Within the last year, have you been afraid of your partner or ex-partner?: No    Within the last year, have you been humiliated or emotionally abused in other ways by your partner or ex-partner?: No    Within the last year, have you been kicked, hit, slapped, or otherwise physically hurt by your partner or ex-partner?: No    Within the last year, have you been raped or forced to have any kind of sexual activity by your partner or ex-partner?: No   Family History  Problem Relation Age of  Onset   Atrial fibrillation Father    Diabetes Mellitus II Neg Hx    ROS: All systems reviewed and negative except as per HPI.   Current Outpatient Medications  Medication Sig Dispense Refill   carvedilol  (COREG ) 6.25 MG tablet TAKE 1 TABLET BY MOUTH 2 TIMES A DAY WITH A MEAL 180 tablet 1   cefadroxil  (DURICEF) 500 MG capsule Take 1 capsule (500 mg total) by mouth 2 (two) times daily. 14 capsule 0   cholecalciferol (VITAMIN D3) 25 MCG (1000 UT) tablet Take 1,000 Units by mouth in the morning and at bedtime.      Cyanocobalamin (VITAMIN B12 PO) Take 1 tablet by mouth daily.      ELIQUIS  5 MG TABS tablet TAKE 1 TABLET BY MOUTH 2 TIMES A DAY 60 tablet 11   FOLIC ACID PO Take 1 tablet by mouth daily.     furosemide  (LASIX ) 20 MG tablet Take 40 mg alternating with 20 mg 135 tablet 3   magnesium  oxide (MAG-OX) 400 MG tablet Take 400 mg by mouth daily.     potassium chloride  (KLOR-CON ) 10 MEQ tablet TAKE 1 TABLET BY MOUTH DAILY WHEN YOU TAKE FUROSEMIDE  45 tablet 3   Probiotic Product (PROBIOTIC DAILY PO) Take 1 capsule by mouth daily.     No current  facility-administered medications for this visit.   Wt Readings from Last 3 Encounters:  03/23/24 77.1 kg (170 lb)  03/03/24 78.2 kg (172 lb 6.4 oz)  09/12/23 79.4 kg (175 lb)   There were no vitals taken for this visit.  There were no vitals filed for this visit.  Physical Exam:   General: *** appearing. No distress  Cardiac: JVP ~*** cm. No murmurs  Resp: Lung sounds clear and equal B/L Abdomen: Soft, non-distended.  Extremities: Warm and dry.  *** edema.  Neuro: A&O x3. Affect pleasant.   Assessment/Plan:  1. Chronic systolic CHF: Nonischemic cardiomyopathy, LHC in 10/20 with no significant coronary disease.  Echo in 10/20 was suggestive of stress (Takotsubo-type) cardiomyopathy with apical ballooning (from severe urosepsis).  However, LV function did not improve on repeat echo in 12/20 or echo in 4/21 (EF still 25%).  ?LBBB cardiomyopathy.  She is now s/p Medtronic CRT-D device. Repeat echo in 1/22 showed EF up to 50-55%, good response to CRT.  Echo 8/23 showed EF 50-55% and echo in 4/24 and 11/24 showed maintenance of improved function with LV EF 60-65%.   - NYHA class II symptoms, not volume overloaded by exam or Optivol.  Medications limited by CKD stage IV.  - Continue Lasix  40 daily alternating with 20 daily, may be able to cut back if creatinine is higher.  Check BMET today.  - Continue Coreg  6.25 mg bid.  - No ACEI/ARNI/ARB with CKD stage 4 and improved EF.  - Off spironolactone  due to hyperkalemia.   - Off Jardiance  with UTIs.  2. CKD stage 4: S/p right nephrectomy in 10/20.  Sees nephrology at [facility]. Most recent creatinine 3.16. - She was turned down for renal transplant. - BMET today.  3. Atrial fibrillation: Paroxysmal.  Noted after device implantation.  No recent episodes.   - Continue apixaban  5 mg bid. CBC today.  - No recent AF, if becomes more frequent and impedes BiV pacing, consider ablation.  4. OSA: Patient snore and has significant fatigue.  - I will arrange  for home sleep study.   Follow up in 6 months with APP.  Follow up in *** with ***  Masai Kidd, NP  09/09/2024

## 2024-09-09 NOTE — Telephone Encounter (Signed)
 Called to confirm/remind patient of their appointment at the [facility] on 09/09/24 2:30.   Appointment:   [x] Confirmed  [] Left mess   [] No answer/No voice mail  [] VM Full/unable to leave message  [] Phone not in service  Patient reminded to bring all medications and/or complete list.  Confirmed patient has transportation. Gave directions, instructed to utilize valet parking.

## 2024-09-19 ENCOUNTER — Telehealth (HOSPITAL_COMMUNITY): Payer: Self-pay

## 2024-09-19 NOTE — Telephone Encounter (Signed)
 Called to confirm/remind patient of their appointment at the [facility] on 09/22/24 2:30.   Appointment:   [] Confirmed  [] Left mess   [] No answer/No voice mail  [x] VM Full/unable to leave message  [] Phone not in service  Patient reminded to bring all medications and/or complete list.  Confirmed patient has transportation. Gave directions, instructed to utilize valet parking.

## 2024-09-22 ENCOUNTER — Ambulatory Visit (HOSPITAL_COMMUNITY)
Admission: RE | Admit: 2024-09-22 | Discharge: 2024-09-22 | Disposition: A | Source: Ambulatory Visit | Attending: Physician Assistant

## 2024-09-22 ENCOUNTER — Encounter (HOSPITAL_COMMUNITY): Payer: Self-pay

## 2024-09-22 VITALS — BP 108/74 | HR 60 | Ht 61.0 in | Wt 171.8 lb

## 2024-09-22 DIAGNOSIS — I480 Paroxysmal atrial fibrillation: Secondary | ICD-10-CM

## 2024-09-22 DIAGNOSIS — N184 Chronic kidney disease, stage 4 (severe): Secondary | ICD-10-CM

## 2024-09-22 DIAGNOSIS — R0683 Snoring: Secondary | ICD-10-CM

## 2024-09-22 DIAGNOSIS — I5022 Chronic systolic (congestive) heart failure: Secondary | ICD-10-CM

## 2024-09-22 DIAGNOSIS — I428 Other cardiomyopathies: Secondary | ICD-10-CM

## 2024-09-22 NOTE — Patient Instructions (Addendum)
 Good to see you today!    Your physician recommends that you schedule a follow-up appointment as scheduled  PLEASE complete the home sleep study  Your physician has requested that you have an echocardiogram. Echocardiography is a painless test that uses sound waves to create images of your heart. It provides your doctor with information about the size and shape of your heart and how well your heart's chambers and valves are working. This procedure takes approximately one hour. There are no restrictions for this procedure. Please do NOT wear cologne, perfume, aftershave, or lotions (deodorant is allowed). Please arrive 15 minutes prior to your appointment time.  Please note: We ask at that you not bring children with you during ultrasound (echo/ vascular) testing. Due to room size and safety concerns, children are not allowed in the ultrasound rooms during exams. Our front office staff cannot provide observation of children in our lobby area while testing is being conducted. An adult accompanying a patient to their appointment will only be allowed in the ultrasound room at the discretion of the ultrasound technician under special circumstances. We apologize for any inconvenience.  If you have any questions or concerns before your next appointment please send us  a message through Sun or call our office at [phone].    TO LEAVE A MESSAGE FOR THE NURSE SELECT OPTION 2, PLEASE LEAVE A MESSAGE INCLUDING: YOUR NAME DATE OF BIRTH CALL BACK NUMBER REASON FOR CALL**this is important as we prioritize the call backs  YOU WILL RECEIVE A CALL BACK THE SAME DAY AS LONG AS YOU CALL BEFORE 4:00 PM At the [facility], you and your health needs are our priority. As part of our continuing mission to provide you with exceptional heart care, we have created designated Provider Care Teams. These Care Teams include your primary Cardiologist (physician) and Advanced Practice Providers (APPs-  Physician Assistants and Nurse Practitioners) who all work together to provide you with the care you need, when you need it.   You may see any of the following providers on your designated Care Team at your next follow up: Dr Toribio Fuel Dr Ezra Shuck Dr. Morene Brownie Greig Mosses, NP Caffie Shed, [place] [facility], [place] Beckey Coe, NP Jordan Lee, NP Ellouise Class, NP Tinnie Redman, PharmD Jaun Bash, PharmD   Please be sure to bring in all your medications bottles to every appointment.    Thank you for choosing [facility]

## 2024-09-22 NOTE — Progress Notes (Signed)
 ADVANCED HF CLINIC NOTE    Date:  09/22/2024  PCP:  Lelon Norleen ORN., MD  Cardiologist:  Wilbert Bihari, MD HF Cardioligist: Dr. Rolan  Chief complaint: CHF  HPI: Jeanne Bruce is a 68 y.o. female with history of CKD stage 4, chronic systolic CHF, multiple sclerosis, and right nephrectomy.  Patient also had remote colon cancer with colostomy.  She has had frequent UTIs and was admitted in 10/20 with urosepsis.  She had respiratory arrest and VF arrest in the ER and was intubated.  Echo showed EF 25% with apical ballooning.  LHC showed no significant coronary disease.  She ended up having right nephrectomy for renal abscess in 10/20.    Echo 12/20: EF still low at 25-30% with peri-apical akinesis.   She has had a hard time taking cardiac medications due to orthostatic symptoms with low BP.  She was sent home from the hospital in 10/20 on hydralazine /Imdur  but had to stop due to low BP.    Echo 4/21 showed EF 25% with septal-lateral dyssynchrony, normal RV.  Medtronic CRT-D device was placed in 6/21.  Repeat echo 1/22: EF up to 50-55% with normal RV.   Echo 8/23 showed EF 50-55%, normal RV, normal IVC.  Echo in 11/24 showed EF 60-65%, normal RV, mild-moderate TR.   She is here today for CHF follow-up. She has been stable from HF standpoint. No shortness of breath, orthopnea, PND or lower extremity edema. Weight averages 168-169 lb. Rides a stationary bike or uses the treadmill 2-3 days a week at the [facility]. No change in activity tolerance. Thoracic impedance suggests mild fluid overload for a couple of days. Just returned from [place]  and had some indiscretions with diet. Reports PCP recently started her on abx for UTI.   MDT device interrogation: OptiVol mid October to mid November then down, OptiVol trending up last few days, thoracic impedance below threshold a few days, brief episodes AF, 97% BiV pacing, no VT/VF.    Labs (11/20): creatinine 1.5 Labs (12/20): creatinine 2.47 Labs  (1/21): creatinine 2.1 => 2.04, BNP 54 Labs (6/21): creatinine 2.33 Labs (7/21): K 5.7, creatinine 2.61 Labs (9/21): K 4.5, creatinine 2.38 Labs (4/22): K 5.5, creatinine 2.5, LDL 91 Labs (8/23): K 3.5, creatinine 2.5, hgb 11.6, LDL 97 Labs (1/24): K 3.5, creatinine 2.7 Labs (7/24): K 3.6, creatinine 3.3 Labs (11/24): K 3.9, SCr 3.2 Labs (4/25): LDL 84, K 3.7, creatinine 6.83 Labs (11/25): Scr 3.7, K 4.3, hgb 10.5  PMH: 1. H/o right nephrectomy for renal abscess in 10/20.  2. Multiple Sclerosis 3. HTN 4. H/o colon cancer: s/p colectomy with colostomy.  5. CKD: Stage 4. Sees a nephrologist at [facility].  6. Frequent UTIs.  7. Chronic systolic CHF: Nonischemic cardiomyopathy. Medtronic CRT-D device.  - Echo (10/20): EF 25%, apical ballooning.  - LHC (10/20): Normal coronaries.  - Echo (12/20): EF 25-30%, peri-apical akinesis, normal RV size and systolic function (similar to 89/79 echo).   - Echo (4/21): EF 25%, septal-lateral dyssynchrony - Echo (1/22): EF 50-55%, normal RV.  - Echo (8/23): EF 50-55%, normal RV, normal IVC.  - Echo (11/24): EF 60-65%, normal RV, mild-moderate TR. 8. LBBB 9. Atrial fibrillation: Paroxysmal.   Social History   Socioeconomic History   Marital status: Married    Spouse name: Not on file   Number of children: Not on file   Years of education: Not on file   Highest education level: Not on file  Occupational History   Not on  file  Tobacco Use   Smoking status: Never   Smokeless tobacco: Never  Vaping Use   Vaping status: Never Used  Substance and Sexual Activity   Alcohol use: Not Currently   Drug use: Never   Sexual activity: Not on file  Other Topics Concern   Not on file  Social History Narrative   Not on file   Social Drivers of Health   Financial Resource Strain: Low Risk (05/18/2022)   [facility]   Overall Financial Resource Strain (CARDIA)    Difficulty of Paying Living Expenses: Not hard at all  Food Insecurity:  Low Risk (07/15/2024)   Received from [facility]   Hunger Vital Sign    Within the past 12 months, you worried that your food would run out before you got money to buy more: Never true    Within the past 12 months, the food you bought just didn't last and you didn't have money to get more. : Never true  Transportation Needs: No Transportation Needs (07/15/2024)   Received from [facility]    In the past 12 months, has lack of reliable transportation kept you from medical appointments, meetings, work or from getting things needed for daily living? : No  Physical Activity: Sufficiently Active (05/18/2022)   [facility] visits prior to 12/16/2022., [facility]   Exercise Vital Sign    On average, how many days per week do you engage in moderate to strenuous exercise (like a brisk walk)?: 5 days    On average, how many minutes do you engage in exercise at this level?: 30 min  Stress: No Stress Concern Present (05/18/2022)   Received from [facility] visits prior to 12/16/2022., [facility]   [employer] of Occupational Health - Occupational Stress Questionnaire    Feeling of Stress : Not at all  Social Connections: Socially Integrated (05/18/2022)   [facility] prior to 12/16/2022., [facility]   Social Connection and Isolation Panel    In a typical week, how many times do you talk on the phone with family, friends, or neighbors?: More than three times a week    How often do you get together with friends or relatives?: More than three times a week    How often do you attend church or religious services?: More than 4 times per year    Do you belong to any clubs or organizations such as church groups, unions, fraternal or athletic groups, or school groups?: Yes    How often do you attend meetings of the clubs or organizations you belong to?: More than 4 times per year    Are  you married, widowed, divorced, separated, never married, or living with a partner?: Married  Intimate Partner Violence: Not At Risk (05/18/2022)   [facility] visits prior to 12/16/2022.   Humiliation, Afraid, Rape, and Kick questionnaire    Within the last year, have you been afraid of your partner or ex-partner?: No    Within the last year, have you been humiliated or emotionally abused in other ways by your partner or ex-partner?: No    Within the last year, have you been kicked, hit, slapped, or otherwise physically hurt by your partner or ex-partner?: No    Within the last year, have you been raped or forced to have any kind of sexual activity by your  partner or ex-partner?: No   Family History  Problem Relation Age of Onset   Atrial fibrillation Father    Diabetes Mellitus II Neg Hx    ROS: All systems reviewed and negative except as per HPI.   Current Outpatient Medications  Medication Sig Dispense Refill   carvedilol  (COREG ) 6.25 MG tablet TAKE 1 TABLET BY MOUTH 2 TIMES A DAY WITH A MEAL 180 tablet 1   cefadroxil  (DURICEF) 500 MG capsule Take 1 capsule (500 mg total) by mouth 2 (two) times daily. 14 capsule 0   cholecalciferol (VITAMIN D3) 25 MCG (1000 UT) tablet Take 1,000 Units by mouth in the morning and at bedtime.      Cyanocobalamin (VITAMIN B12 PO) Take 1 tablet by mouth daily.      ELIQUIS  5 MG TABS tablet TAKE 1 TABLET BY MOUTH 2 TIMES A DAY 60 tablet 11   FOLIC ACID PO Take 1 tablet by mouth daily.     furosemide  (LASIX ) 20 MG tablet Take 40 mg alternating with 20 mg 135 tablet 3   magnesium  oxide (MAG-OX) 400 MG tablet Take 400 mg by mouth daily.     potassium chloride  (KLOR-CON ) 10 MEQ tablet TAKE 1 TABLET BY MOUTH DAILY WHEN YOU TAKE FUROSEMIDE  45 tablet 3   Probiotic Product (PROBIOTIC DAILY PO) Take 1 capsule by mouth daily.     No current facility-administered medications for this encounter.   Wt Readings from Last 3 Encounters:   09/22/24 77.9 kg (171 lb 12.8 oz)  03/23/24 77.1 kg (170 lb)  03/03/24 78.2 kg (172 lb 6.4 oz)   BP 108/74   Pulse 60   Ht 5' 1 (1.549 m)   Wt 77.9 kg (171 lb 12.8 oz)   SpO2 93%   BMI 32.46 kg/m   Filed Weights   09/22/24 1442  Weight: 77.9 kg (171 lb 12.8 oz)   General:  Well appearing. No distress.  Cor: No JVD. Regular rate & rhythm. No murmurs. Lungs: clear Abdomen: soft, nontender, nondistended. Extremities: trace edema Neuro: alert & orientedx3. Affect pleasant   Assessment/Plan:  1. Chronic systolic CHF: Nonischemic cardiomyopathy, LHC in 10/20 with no significant coronary disease.  Echo in 10/20 was suggestive of stress (Takotsubo-type) cardiomyopathy with apical ballooning (from severe urosepsis).  However, LV function did not improve on repeat echo in 12/20 or echo in 4/21 (EF still 25%).  ?LBBB cardiomyopathy.  She is now s/p Medtronic CRT-D device. Repeat echo in 1/22 showed EF up to 50-55%, good response to CRT.  Echo 8/23 showed EF 50-55% and echo in 4/24 and 11/24 showed maintenance of improved function with LV EF 60-65%.   - NYHA class II symptoms, not volume overloaded by exam. No HF symptoms. Optivol up slightly and thoracic impedance down after recent vacation. Taking lasix  40 daily alternating with 20 every other day. Will hold off on increasing lasix  today d/t active UTI. Will get another remote interrogation in 1 week.  - Medications limited by CKD stage IV.  - Continue Coreg  6.25 mg bid.  - No ACEI/ARNI/ARB with CKD stage 4 and improved EF.  - Off spironolactone  due to hyperkalemia.   - Off Jardiance  with UTIs. - Repeat echo at f/u in 6 months 2. CKD stage 4: S/p right nephrectomy in 10/20.  Sees nephrology at [facility]. Most recent creatinine 3.7.  - She was turned down for renal transplant. 3. Atrial fibrillation: Paroxysmal.  Noted after device implantation.  Very brief episodes with < 1% burden.  -  Continue eliquis  5 BID. Last hgb stable at 10.5. - If  becomes more frequent and impedes BiV pacing, will need to consider ablation or AAD. 4. ? OSA: Snores and notes daytime fatigue. - Has not completed sleep study, reordered  Follow up in 6 months with APP with echo   [facility], Rocsi Hazelbaker N, PA-C  09/22/2024

## 2024-09-29 ENCOUNTER — Telehealth: Payer: Self-pay

## 2024-09-29 ENCOUNTER — Ambulatory Visit: Payer: Medicare HMO

## 2024-09-29 DIAGNOSIS — I5022 Chronic systolic (congestive) heart failure: Secondary | ICD-10-CM | POA: Diagnosis not present

## 2024-09-29 NOTE — Telephone Encounter (Signed)
 Remote transmission received and reviewed on 09/29/2024. Presenting Rhythm AS/VP 78. No episodes. Heart failure diagnostics abnormal at this time.   Will forward to [facility], [place] as requested d/t patient actively being treated for UTI and holding off on diuresing until we get results next week.  Optivol and thoracic impedance results below.  Will continue to monitor and update accordingly.   ______________________________________________________________

## 2024-09-30 NOTE — Telephone Encounter (Signed)
 Optivol is elevated. She takes 40 mg furosemide  alternating with 20 mg every other day. Please instruct her to take 40 mg every day for 1 week then reduce to usual dosing. Make sure to take potassium supp every day

## 2024-09-30 NOTE — Telephone Encounter (Signed)
 Attempted call to patient and no answer.  Will try call back to provide recommendations by Manuelita Dutch, PA

## 2024-09-30 NOTE — Telephone Encounter (Signed)
 Attempted call to patient and no answer.  Mail box is full.  Will try call back to provide recommendations by Manuelita Dutch, PA.

## 2024-10-01 LAB — CUP PACEART REMOTE DEVICE CHECK
Battery Remaining Longevity: 64 mo
Battery Voltage: 2.97 V
Brady Statistic AP VP Percent: 0.02 %
Brady Statistic AP VS Percent: 0 %
Brady Statistic AS VP Percent: 98.3 %
Brady Statistic AS VS Percent: 1.68 %
Brady Statistic RA Percent Paced: 0.02 %
Brady Statistic RV Percent Paced: 2.08 %
Date Time Interrogation Session: 20251215031704
HighPow Impedance: 78 Ohm
Implantable Lead Connection Status: 753985
Implantable Lead Connection Status: 753985
Implantable Lead Connection Status: 753985
Implantable Lead Implant Date: 20210621
Implantable Lead Implant Date: 20210621
Implantable Lead Implant Date: 20210621
Implantable Lead Location: 753858
Implantable Lead Location: 753859
Implantable Lead Location: 753860
Implantable Lead Model: 4398
Implantable Lead Model: 5076
Implantable Pulse Generator Implant Date: 20210621
Lead Channel Impedance Value: 152 Ohm
Lead Channel Impedance Value: 152 Ohm
Lead Channel Impedance Value: 156.606
Lead Channel Impedance Value: 156.606
Lead Channel Impedance Value: 156.606
Lead Channel Impedance Value: 266 Ohm
Lead Channel Impedance Value: 304 Ohm
Lead Channel Impedance Value: 304 Ohm
Lead Channel Impedance Value: 304 Ohm
Lead Channel Impedance Value: 323 Ohm
Lead Channel Impedance Value: 323 Ohm
Lead Channel Impedance Value: 323 Ohm
Lead Channel Impedance Value: 380 Ohm
Lead Channel Impedance Value: 494 Ohm
Lead Channel Impedance Value: 513 Ohm
Lead Channel Impedance Value: 532 Ohm
Lead Channel Impedance Value: 532 Ohm
Lead Channel Impedance Value: 570 Ohm
Lead Channel Pacing Threshold Amplitude: 0.5 V
Lead Channel Pacing Threshold Amplitude: 0.75 V
Lead Channel Pacing Threshold Pulse Width: 0.4 ms
Lead Channel Pacing Threshold Pulse Width: 0.4 ms
Lead Channel Sensing Intrinsic Amplitude: 11 mV
Lead Channel Sensing Intrinsic Amplitude: 11 mV
Lead Channel Sensing Intrinsic Amplitude: 2.25 mV
Lead Channel Sensing Intrinsic Amplitude: 2.25 mV
Lead Channel Setting Pacing Amplitude: 1.5 V
Lead Channel Setting Pacing Amplitude: 2 V
Lead Channel Setting Pacing Amplitude: 2.5 V
Lead Channel Setting Pacing Pulse Width: 0.4 ms
Lead Channel Setting Pacing Pulse Width: 0.4 ms
Lead Channel Setting Sensing Sensitivity: 0.3 mV
Zone Setting Status: 755011
Zone Setting Status: 755011

## 2024-10-01 NOTE — Telephone Encounter (Signed)
 Attempted patient call to provide recommendations by Manuelita Dutch, PA.  No answer and mail box is full.    Unable to reach patient to provide recommendations.    Forward to [place], [place] at HF clinic advising unable to reach patient to provide recommendations.

## 2024-10-01 NOTE — Telephone Encounter (Signed)
 Pt aware and voiced understanding

## 2024-10-02 ENCOUNTER — Ambulatory Visit: Payer: Self-pay | Admitting: Cardiology

## 2024-10-03 ENCOUNTER — Encounter: Admitting: Cardiology

## 2024-10-03 DIAGNOSIS — R0683 Snoring: Secondary | ICD-10-CM | POA: Diagnosis not present

## 2024-10-03 NOTE — Progress Notes (Signed)
 Remote ICD Transmission

## 2024-10-11 NOTE — Procedures (Signed)
" ° °  SLEEP STUDY REPORT Patient Information Study Date: 10/03/2024 Patient Name: Jeanne Bruce Patient ID: [id number] Birth Date: [birth year] Age: 68 Gender: Female BMI: 32.5 (W=172 lb, H=5' 1'') Stopbang: 5 Referring Physician: Ezra Shuck, MD  TEST DESCRIPTION: Home sleep apnea testing was completed using the WatchPat, a Type 1 device, utilizing peripheral arterial tonometry (PAT), chest movement, actigraphy, pulse oximetry, pulse rate, body position and snore. AHI was calculated with apnea and hypopnea using valid sleep time as the denominator. RDI includes apneas, hypopneas, and RERAs. The data acquired and the scoring of sleep and all associated events were performed in accordance with the recommended standards and specifications as outlined in the AASM Manual for the Scoring of Sleep and Associated Events 2.2.0 (2015).  FINDINGS: 1. No evidence of Obstructive Sleep Apnea with AHI 0.4/hr. 2. No Central Sleep Apnea. 3. Oxygen desaturations as low as 89%. 4. Mild to moderate snoring was present. O2 sats were < 88% for 0 minutes. 5. Total sleep time was 8 hrs and 1 min. 6. 14.8% of total sleep time was spent in REM sleep. 7. Normal sleep onset latency at 22 min. 8. Prolonged REM sleep onset latency at 190 min. 9. Total awakenings were 6.  DIAGNOSIS: Normal study with no significant sleep disordered breathing.  RECOMMENDATIONS: 1. Normal study with no significant sleep disordered breathing. 2. Healthy sleep recommendations include: adequate nightly sleep (normal 7-9 hrs/night), avoidance of caffeine after noon and alcohol near bedtime, and maintaining a sleep environment that is cool, dark and quiet. 3. Weight loss for overweight patients is recommended. 4. Snoring recommendations include: weight loss where appropriate, side sleeping, and avoidance of alcohol before bed. 5. Operation of motor vehicle or dangerous equipment must be avoided when feeling drowsy, excessively sleepy,  or mentally fatigued. 6. An ENT consultation which may be useful for specific causes of and possible treatment of bothersome snoring . 7. Weight loss may be of benefit in reducing the severity of snoring.   Signature: Wilbert Bihari, MD; [facility]; Diplomat, American Board of Sleep Medicine Electronically Signed: 10/11/2024 7:21:30 AM "

## 2024-10-13 ENCOUNTER — Telehealth: Payer: Self-pay | Admitting: *Deleted

## 2024-10-13 ENCOUNTER — Ambulatory Visit: Attending: Cardiology

## 2024-10-13 DIAGNOSIS — I5022 Chronic systolic (congestive) heart failure: Secondary | ICD-10-CM

## 2024-10-13 DIAGNOSIS — Z95810 Presence of automatic (implantable) cardiac defibrillator: Secondary | ICD-10-CM

## 2024-10-13 NOTE — Telephone Encounter (Signed)
-----   Message from Wilbert Bihari, MD sent at 10/11/2024  7:24 AM EST ----- Please let patient know that sleep study showed no significant sleep apnea.

## 2024-10-13 NOTE — Telephone Encounter (Signed)
 The patient has been notified of the result via her mychart portal.

## 2024-10-14 NOTE — Progress Notes (Signed)
 EPIC Encounter for ICM Monitoring  Patient Name: Jeanne Bruce is a 68 y.o. female Date: 10/14/2024 Primary Care Physican: Lelon Norleen ORN., MD Primary Cardiologist: Rolan Electrophysiologist: Inocencio Bi-V Pacing: 96.7%        04/18/2023 Weight: 165 lbs 11/20/2023 Weight: 165 lbs 03/03/2024 Office Weight: 172 lbs 08/18/2024 Office Weight: 166 lbs (care everywhere)   Since 29-Sep-2024 Time in AT/AF  0.0 hr/day (0.0%)                       Transmission results reviewed.     Diet: Not strict on salt intake.   Since 08/25/2024 ICM Remote Transmission: Optivol thoracic impedance suggesting normal fluid levels with the exception of possible fluid accumulation 09/18/2024-10/04/2024.   Prescribed:  Furosemide  20 mg take 1 tablet by mouth every other day alternating with 40 mg every other day.   Potassium 10 mEq take 1 tablet by mouth on the days that Furosemide  40 mg is taken.   Labs: 08/18/2024 Creatinine 3.7,   BUN 59, Potassium 4.3, Sodium 137, GFR 13 07/22/2024 Creatinine 3.85, BUN 68, Potassium 4.3, Sodium 137, GFR 12 03/03/2024 Creatinine 3.99, BUN 51, Potassium 3.4, Sodium 139, GFR 12 01/17/2024 Creatinine 3.16, BUN 54, Potassium 3.7, Sodium 139, GFR 15 08/21/2023 Creatinine 3.2    BUN 56, Potassium 3.9, Sodium 140, GFR 15 06/07/2023 Creatinine 2.86, BUN 69, Potassium 4.0, Sodium 137, GFR 17  05/21/2023 Creatinine 2.36, BUN 57, Potassium 4.2, Sodium 140, GFR 22  05/02/2023 Creatinine 3.3,   BUN 82, Potassium 3.6, Sodium 136, GFR 15 04/26/2023 Creatinine 3.39, BUN 56, Potassium 4.0, Sodium 138, GFR 14 A complete set of results can be found in Results Review.   Recommendations:  No changes.   Follow-up plan: ICM clinic phone appointment on 11/17/2024.   91 day device clinic remote transmission 12/29/2024.     EP/Cardiology Office Visits:  Recall 03/23/2025 with Dr Rolan   Recall 03/17/2024 with Dr. Inocencio (Yearly).       Copy of ICM check sent to Dr. Inocencio.    Remote monitoring is  medically necessary for Heart Failure Management.    Daily Thoracic Impedance ICM trend: 07/04/2024 through 10/13/2024.    12-14 Month Thoracic Impedance ICM trend:     Mitzie GORMAN Garner, RN 10/14/2024 4:59 PM

## 2024-11-13 NOTE — Progress Notes (Signed)
 31 day ICM Remote transmission canceled due to [facility] clinic is on hold until further notice.  91 day remote monitoring will continue per protocol.

## 2024-11-17 ENCOUNTER — Ambulatory Visit

## 2025-03-23 ENCOUNTER — Other Ambulatory Visit (HOSPITAL_COMMUNITY)

## 2025-03-23 ENCOUNTER — Ambulatory Visit (HOSPITAL_COMMUNITY)
